# Patient Record
Sex: Male | Born: 1950 | Race: White | Hispanic: No | Marital: Married | Smoking: Former smoker
Health system: Southern US, Community
[De-identification: ages and names within clinical notes are randomized; demographics above are authoritative.]

## PROBLEM LIST (undated history)

## (undated) DIAGNOSIS — E785 Hyperlipidemia, unspecified: Secondary | ICD-10-CM

## (undated) DIAGNOSIS — M1990 Unspecified osteoarthritis, unspecified site: Secondary | ICD-10-CM

## (undated) DIAGNOSIS — I209 Angina pectoris, unspecified: Secondary | ICD-10-CM

## (undated) DIAGNOSIS — I10 Essential (primary) hypertension: Secondary | ICD-10-CM

## (undated) DIAGNOSIS — N521 Erectile dysfunction due to diseases classified elsewhere: Secondary | ICD-10-CM

## (undated) DIAGNOSIS — M109 Gout, unspecified: Secondary | ICD-10-CM

## (undated) DIAGNOSIS — IMO0001 Reserved for inherently not codable concepts without codable children: Secondary | ICD-10-CM

## (undated) DIAGNOSIS — J449 Chronic obstructive pulmonary disease, unspecified: Secondary | ICD-10-CM

## (undated) HISTORY — PX: OTHER SURGICAL HISTORY: SHX169

## (undated) HISTORY — PX: TONSILLECTOMY: SUR1361

## (undated) HISTORY — PX: CARDIAC CATHETERIZATION: SHX172

---

## 2012-02-29 ENCOUNTER — Ambulatory Visit: Payer: Self-pay | Admitting: Family Medicine

## 2014-04-09 ENCOUNTER — Observation Stay: Payer: Self-pay | Admitting: Internal Medicine

## 2014-04-09 LAB — COMPREHENSIVE METABOLIC PANEL
Albumin: 3.6 g/dL (ref 3.4–5.0)
Alkaline Phosphatase: 91 U/L
Anion Gap: 9 (ref 7–16)
BUN: 14 mg/dL (ref 7–18)
Bilirubin,Total: 0.6 mg/dL (ref 0.2–1.0)
Calcium, Total: 9 mg/dL (ref 8.5–10.1)
Chloride: 109 mmol/L — ABNORMAL HIGH (ref 98–107)
Co2: 23 mmol/L (ref 21–32)
Creatinine: 1.05 mg/dL (ref 0.60–1.30)
EGFR (African American): >60
EGFR (Non-African Amer.): >60
Glucose: 124 mg/dL — ABNORMAL HIGH (ref 65–99)
Osmolality: 283 (ref 275–301)
Potassium: 3.4 mmol/L — ABNORMAL LOW (ref 3.5–5.1)
SGOT(AST): 36 U/L (ref 15–37)
SGPT (ALT): 22 U/L (ref 12–78)
Sodium: 141 mmol/L (ref 136–145)
Total Protein: 7.7 g/dL (ref 6.4–8.2)

## 2014-04-09 LAB — CBC
HCT: 45.8 % (ref 40.0–52.0)
HGB: 16.1 g/dL (ref 13.0–18.0)
MCH: 32.6 pg (ref 26.0–34.0)
MCHC: 35.2 g/dL (ref 32.0–36.0)
MCV: 93 fL (ref 80–100)
Platelet: 180 10*3/uL (ref 150–440)
RBC: 4.96 10*6/uL (ref 4.40–5.90)
RDW: 13.1 % (ref 11.5–14.5)
WBC: 8.6 10*3/uL (ref 3.8–10.6)

## 2014-04-09 LAB — MAGNESIUM: Magnesium: 2 mg/dL

## 2014-04-09 LAB — TROPONIN I
Troponin-I: <0.02 ng/mL
Troponin-I: <0.02 ng/mL

## 2014-04-09 LAB — PRO B NATRIURETIC PEPTIDE: B-Type Natriuretic Peptide: 33 pg/mL (ref 0–125)

## 2014-04-09 LAB — CK-MB: CK-MB: <0.5 ng/mL — ABNORMAL LOW (ref 0.5–3.6)

## 2014-04-10 LAB — COMPREHENSIVE METABOLIC PANEL
Albumin: 3.5 g/dL (ref 3.4–5.0)
Alkaline Phosphatase: 85 U/L
Anion Gap: 11 (ref 7–16)
BUN: 16 mg/dL (ref 7–18)
Bilirubin,Total: 0.6 mg/dL (ref 0.2–1.0)
Calcium, Total: 9.6 mg/dL (ref 8.5–10.1)
Chloride: 108 mmol/L — ABNORMAL HIGH (ref 98–107)
Co2: 21 mmol/L (ref 21–32)
Creatinine: 1.2 mg/dL (ref 0.60–1.30)
EGFR (African American): >60
EGFR (Non-African Amer.): >60
Glucose: 158 mg/dL — ABNORMAL HIGH (ref 65–99)
Osmolality: 284 (ref 275–301)
Potassium: 4.2 mmol/L (ref 3.5–5.1)
SGOT(AST): 32 U/L (ref 15–37)
SGPT (ALT): 19 U/L (ref 12–78)
Sodium: 140 mmol/L (ref 136–145)
Total Protein: 7.8 g/dL (ref 6.4–8.2)

## 2014-04-10 LAB — CBC WITH DIFFERENTIAL/PLATELET
Basophil #: 0 10*3/uL (ref 0.0–0.1)
Basophil %: 0.1 %
Eosinophil #: 0 10*3/uL (ref 0.0–0.7)
Eosinophil %: 0.1 %
HCT: 44.2 % (ref 40.0–52.0)
HGB: 15.6 g/dL (ref 13.0–18.0)
Lymphocyte #: 1.2 10*3/uL (ref 1.0–3.6)
Lymphocyte %: 20.7 %
MCH: 32.7 pg (ref 26.0–34.0)
MCHC: 35.3 g/dL (ref 32.0–36.0)
MCV: 92 fL (ref 80–100)
Monocyte #: 0 x10 3/mm — ABNORMAL LOW (ref 0.2–1.0)
Monocyte %: 0.6 %
Neutrophil #: 4.4 10*3/uL (ref 1.4–6.5)
Neutrophil %: 78.5 %
Platelet: 170 10*3/uL (ref 150–440)
RBC: 4.78 10*6/uL (ref 4.40–5.90)
RDW: 13.2 % (ref 11.5–14.5)
WBC: 5.6 10*3/uL (ref 3.8–10.6)

## 2014-04-10 LAB — TROPONIN I
Troponin-I: <0.02 ng/mL
Troponin-I: <0.02 ng/mL

## 2014-04-10 LAB — CK-MB
CK-MB: <0.5 ng/mL — ABNORMAL LOW (ref 0.5–3.6)
CK-MB: 0.6 ng/mL (ref 0.5–3.6)
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
## 2015-01-30 NOTE — Discharge Summary (Signed)
PATIENT NAME:  William Murray, Petar N MR#:  [id number] DATE OF BIRTH:  12/04/1950  DATE OF ADMISSION:  04/09/2014 DATE OF DISCHARGE:  04/10/2014  DISCHARGE DIAGNOSES: 1. Chest pain, likely noncardiac, with negative cardiac enzymes. Could be due to acute bronchitis. History of bronchitis.  2. Chronic obstructive pulmonary disease exacerbation on steroid taper along with Spiriva, Duo-Neb and Symbicort.  3. Muscle cramps due to hypokalemia, improving. 4. Hypokalemia, repleted and resolved.   SECONDARY DIAGNOSES: 1. Hypertension.  2. Hyperlipidemia.   CONSULTATIONS: None.   PROCEDURES AND RADIOLOGY:  1. Chest x-ray on July 2nd showed no acute cardiopulmonary disease.  2. CT scan of the head without contrast in the ED showed no acute intracranial pathology.  3. CT angiogram of the chest on July 2nd showed no PE. No acute cardiopulmonary disease.   HISTORY AND SHORT HOSPITAL COURSE: The patient is a [age] year old male with above-mentioned medical problems who was admitted for chest pain. Please see Dr. Belva Chimesima Vaickute's dictated history and physical for further details. The patient was ruled out with 4 negative sets of troponins. His chest pain was significantly better, although he did have some cough which was thought to be contributing to use pain, likely muscular. After discussion with the patient, family and cardiology, the patient was discharged home in stable condition.   PERTINENT PHYSICAL EXAMINATION: On the date of discharge:  VITAL SIGNS: Temperature 98.6, heart rate 70 per minute, respirations 18 per minute, blood pressure 131/69 mmHg. He was saturating 94% on room air.  CARDIOVASCULAR: S1, S2 normal. No murmurs or rubs.  LUNGS: Clear to auscultation bilaterally. No wheezes, rales, rhonchi, or crepitation.  ABDOMEN: Soft, benign.  NEUROLOGIC: Nonfocal examination.  All other physical examination remained at baseline.   DISCHARGE MEDICATIONS:  1. Atorvastatin 10 mg p.o. at bedtime.   2. HCTZ/losartan 12.5/50 mg 1 tablet p.o. daily.  3. DuoNeb inhaled 4 times a day as needed.  4. Budesonide/formoterol 1 puff inhaled at bedtime. 5. Spiriva once daily.  6. Azithromycin 250 mg p.o. daily for 4 more days.  7. Prednisone 60 mg p.o. daily, taper 10 mg daily until finished.   DISCHARGE DIET: Low sodium.   DISCHARGE ACTIVITY: As tolerated.   DISCHARGE INSTRUCTIONS AND FOLLOWUP: The patient was instructed to follow up with his primary care physician at [facility] in 2 to 4 weeks.  [facility] early next week by Dr. Lorine BearsMuhammad Arida.    TOTAL TIME DISCHARGING THIS PATIENT: 45 minutes.      ____________________________ Ellamae SiaVipul S. Sherryll BurgerShah, MD vss:lm D: 04/10/2014 15:33:05 ET T: 04/11/2014 01:45:47 ET JOB#: [id number]  cc: Sarahanne Novakowski S. Sherryll BurgerShah, MD, <Dictator> Muhammad A. Kirke CorinArida, MD PCP at [facility] Matt Delpizzo S [facility] MD ELECTRONICALLY SIGNED 04/11/2014 16:39

## 2015-04-28 ENCOUNTER — Observation Stay
Admission: EM | Admit: 2015-04-28 | Discharge: 2015-04-29 | Disposition: A | Discharge status: Home / Self Care | Payer: Medicare Other | Attending: Specialist | Admitting: Specialist

## 2015-04-28 ENCOUNTER — Observation Stay
Admit: 2015-04-28 | Discharge: 2015-04-28 | Disposition: A | Discharge status: Home / Self Care | Payer: Medicare Other | Attending: Internal Medicine | Admitting: Internal Medicine

## 2015-04-28 ENCOUNTER — Emergency Department: Payer: Medicare Other

## 2015-04-28 DIAGNOSIS — I499 Cardiac arrhythmia, unspecified: Secondary | ICD-10-CM

## 2015-04-28 DIAGNOSIS — E782 Mixed hyperlipidemia: Secondary | ICD-10-CM | POA: Insufficient documentation

## 2015-04-28 DIAGNOSIS — J449 Chronic obstructive pulmonary disease, unspecified: Secondary | ICD-10-CM | POA: Insufficient documentation

## 2015-04-28 DIAGNOSIS — R001 Bradycardia, unspecified: Secondary | ICD-10-CM | POA: Insufficient documentation

## 2015-04-28 DIAGNOSIS — Z79899 Other long term (current) drug therapy: Secondary | ICD-10-CM | POA: Diagnosis not present

## 2015-04-28 DIAGNOSIS — M109 Gout, unspecified: Secondary | ICD-10-CM | POA: Insufficient documentation

## 2015-04-28 DIAGNOSIS — R072 Precordial pain: Principal | ICD-10-CM | POA: Insufficient documentation

## 2015-04-28 DIAGNOSIS — I471 Supraventricular tachycardia: Secondary | ICD-10-CM | POA: Insufficient documentation

## 2015-04-28 DIAGNOSIS — F17200 Nicotine dependence, unspecified, uncomplicated: Secondary | ICD-10-CM | POA: Diagnosis not present

## 2015-04-28 DIAGNOSIS — I200 Unstable angina: Secondary | ICD-10-CM | POA: Insufficient documentation

## 2015-04-28 DIAGNOSIS — R0602 Shortness of breath: Secondary | ICD-10-CM | POA: Insufficient documentation

## 2015-04-28 DIAGNOSIS — I10 Essential (primary) hypertension: Secondary | ICD-10-CM | POA: Diagnosis not present

## 2015-04-28 DIAGNOSIS — R079 Chest pain, unspecified: Secondary | ICD-10-CM

## 2015-04-28 DIAGNOSIS — E876 Hypokalemia: Secondary | ICD-10-CM | POA: Insufficient documentation

## 2015-04-28 DIAGNOSIS — Z9889 Other specified postprocedural states: Secondary | ICD-10-CM | POA: Insufficient documentation

## 2015-04-28 HISTORY — DX: Hyperlipidemia, unspecified: E78.5

## 2015-04-28 HISTORY — DX: Chronic obstructive pulmonary disease, unspecified: J44.9

## 2015-04-28 HISTORY — DX: Reserved for inherently not codable concepts without codable children: IMO0001

## 2015-04-28 HISTORY — DX: Gout, unspecified: M10.9

## 2015-04-28 HISTORY — DX: Essential (primary) hypertension: I10

## 2015-04-28 HISTORY — DX: Unspecified osteoarthritis, unspecified site: M19.90

## 2015-04-28 HISTORY — DX: Erectile dysfunction due to diseases classified elsewhere: N52.1

## 2015-04-28 HISTORY — DX: Angina pectoris, unspecified: I20.9

## 2015-04-28 LAB — COMPREHENSIVE METABOLIC PANEL
ALT: 13 U/L — ABNORMAL LOW (ref 17–63)
AST: 29 U/L (ref 15–41)
Albumin: 4.2 g/dL (ref 3.5–5.0)
Alkaline Phosphatase: 90 U/L (ref 38–126)
Anion gap: 9 (ref 5–15)
BUN: 17 mg/dL (ref 6–20)
CO2: 23 mmol/L (ref 22–32)
Calcium: 9.2 mg/dL (ref 8.9–10.3)
Chloride: 106 mmol/L (ref 101–111)
Creatinine, Ser: 0.97 mg/dL (ref 0.61–1.24)
GFR calc Af Amer: >60 mL/min (ref >60)
GFR calc non Af Amer: >60 mL/min (ref >60)
Glucose, Bld: 175 mg/dL — ABNORMAL HIGH (ref 65–99)
Potassium: 3.1 mmol/L — ABNORMAL LOW (ref 3.5–5.1)
Sodium: 138 mmol/L (ref 135–145)
Total Bilirubin: 1.2 mg/dL (ref 0.3–1.2)
Total Protein: 7.3 g/dL (ref 6.5–8.1)

## 2015-04-28 LAB — MAGNESIUM: Magnesium: 1.9 mg/dL (ref 1.7–2.4)

## 2015-04-28 LAB — CBC WITH DIFFERENTIAL/PLATELET
Basophils Absolute: 0 10*3/uL (ref 0–0.1)
Basophils Relative: 0 %
Eosinophils Absolute: 0.1 10*3/uL (ref 0–0.7)
Eosinophils Relative: 1 %
HCT: 43.2 % (ref 40.0–52.0)
Hemoglobin: 15.3 g/dL (ref 13.0–18.0)
Lymphocytes Relative: 20 %
Lymphs Abs: 1.6 10*3/uL (ref 1.0–3.6)
MCH: 32.4 pg (ref 26.0–34.0)
MCHC: 35.5 g/dL (ref 32.0–36.0)
MCV: 91.1 fL (ref 80.0–100.0)
Monocytes Absolute: 0.5 10*3/uL (ref 0.2–1.0)
Monocytes Relative: 7 %
Neutro Abs: 6 10*3/uL (ref 1.4–6.5)
Neutrophils Relative %: 72 %
Platelets: 155 10*3/uL (ref 150–440)
RBC: 4.74 MIL/uL (ref 4.40–5.90)
RDW: 12.9 % (ref 11.5–14.5)
WBC: 8.3 10*3/uL (ref 3.8–10.6)

## 2015-04-28 LAB — FIBRIN DERIVATIVES D-DIMER (ARMC ONLY): Fibrin derivatives D-dimer (ARMC): 477.37 (ref 0–499)

## 2015-04-28 LAB — TROPONIN I
Troponin I: <0.03 ng/mL (ref <0.031)
Troponin I: <0.03 ng/mL (ref <0.031)

## 2015-04-28 MED ORDER — MORPHINE SULFATE 2 MG/ML IJ SOLN
1.0000 mg | Freq: Once | INTRAMUSCULAR | Status: AC
  Administered 2015-04-28: 1 mg via INTRAVENOUS
  Filled 2015-04-28: qty 1

## 2015-04-28 MED ORDER — LOSARTAN POTASSIUM 50 MG PO TABS: 50.0000 mg | ORAL_TABLET | Freq: Every day | ORAL | Status: DC

## 2015-04-28 MED ORDER — NITROGLYCERIN 0.4 MG SL SUBL: 0.4000 mg | SUBLINGUAL_TABLET | SUBLINGUAL | Status: DC | PRN

## 2015-04-28 MED ORDER — ONDANSETRON HCL 4 MG/2ML IJ SOLN: 4.0000 mg | Freq: Four times a day (QID) | INTRAMUSCULAR | Status: DC | PRN

## 2015-04-28 MED ORDER — ALLOPURINOL 100 MG PO TABS: 100.0000 mg | ORAL_TABLET | Freq: Every day | ORAL | Status: DC

## 2015-04-28 MED ORDER — HYDROCHLOROTHIAZIDE 12.5 MG PO CAPS: 12.5000 mg | ORAL_CAPSULE | Freq: Every day | ORAL | Status: DC

## 2015-04-28 MED ORDER — LOSARTAN POTASSIUM-HCTZ 50-12.5 MG PO TABS: 1.0000 | ORAL_TABLET | Freq: Every day | ORAL | Status: DC

## 2015-04-28 MED ORDER — SODIUM CHLORIDE 0.9 % IJ SOLN
3.0000 mL | Freq: Two times a day (BID) | INTRAMUSCULAR | Status: DC
  Administered 2015-04-28 (×2): 3 mL via INTRAVENOUS

## 2015-04-28 MED ORDER — ONDANSETRON HCL 4 MG PO TABS: 4.0000 mg | ORAL_TABLET | Freq: Four times a day (QID) | ORAL | Status: DC | PRN

## 2015-04-28 MED ORDER — ASPIRIN 81 MG PO CHEW
81.0000 mg | CHEWABLE_TABLET | Freq: Every day | ORAL | Status: DC
  Administered 2015-04-28: 81 mg via ORAL
  Filled 2015-04-28: qty 1

## 2015-04-28 MED ORDER — ATORVASTATIN CALCIUM 10 MG PO TABS: 10.0000 mg | ORAL_TABLET | Freq: Every evening | ORAL | Status: DC

## 2015-04-28 MED ORDER — POTASSIUM CHLORIDE CRYS ER 20 MEQ PO TBCR
40.0000 meq | EXTENDED_RELEASE_TABLET | Freq: Once | ORAL | Status: AC
Start: 2015-04-28 — End: 2015-04-28
  Administered 2015-04-28: 40 meq via ORAL
  Filled 2015-04-28: qty 2

## 2015-04-28 MED ORDER — ENOXAPARIN SODIUM 40 MG/0.4ML ~~LOC~~ SOLN
40.0000 mg | SUBCUTANEOUS | Status: DC
  Administered 2015-04-28: 40 mg via SUBCUTANEOUS
  Filled 2015-04-28: qty 0.4

## 2015-04-28 MED ORDER — ALUM & MAG HYDROXIDE-SIMETH 200-200-20 MG/5ML PO SUSP: 30.0000 mL | Freq: Four times a day (QID) | ORAL | Status: DC | PRN

## 2015-04-28 NOTE — H&P (Addendum)
[facility] [place] [facility]   PATIENT NAME: William Murray    MR#:  [id number]  DATE OF BIRTH:  [birth year]  DATE OF ADMISSION:  04/28/2015  PRIMARY CARE PHYSICIAN: No PCP Per Patient   REQUESTING/REFERRING PHYSICIAN:Dr.Gayle  CHIEF COMPLAINT:   Chief Complaint  Patient presents with  . Chest Pain    HISTORY OF PRESENT ILLNESS:  William Murray  is a 64 y.o. male with a known history of COPD, hyperlipidemia, hypertension came in because of for chest pain. Patient chest pain started today morning in the middle of the chest and it is sharp and 4 out of 10 and  radiated to the jaw. Patient to the received the 2 aspirin 81 mg each by EMS and then chest pressure resolved. Patient 2  Sets of troponins were negative and he was ready to be discharged but he developed 9 beat run of nonsustained V. tach and he was symptomatic that time. Concerning his chest pain and nonsustained V. tach patient will be admitted to observation status. Patient right now is chest pain-free and the patient denies any radiation of pain and no aggravating factors and no patient just continued with aspirin given by EMS. Denies cough, cough, nausea, shortness of breath, diaphoresis.  PAST MEDICAL HISTORY:   Past Medical History  Diagnosis Date  . Hypertension   . COPD (chronic obstructive pulmonary disease)   . Gout   . Hyperlipidemia   . Erectile disorder due to medical condition in male     PAST SURGICAL HISTOIRY:   Past Surgical History  Procedure Laterality Date  . Tonsillectomy      SOCIAL HISTORY:   History  Substance Use Topics  . Smoking status: Current Every Day Smoker  . Smokeless tobacco: Never Used  . Alcohol Use: Yes    FAMILY HISTORY:  No family history on file.  DRUG ALLERGIES:  No Known Allergies  REVIEW OF SYSTEMS:  CONSTITUTIONAL: No fever, fatigue or weakness.  EYES: No blurred or double vision.  EARS, NOSE, AND THROAT: No tinnitus or ear pain.   RESPIRATORY: No cough, shortness of breath, wheezing or hemoptysis.  CARDIOVASCULAR: No chest pain, orthopnea, edema.  GASTROINTESTINAL: No nausea, vomiting, diarrhea or abdominal pain.  GENITOURINARY: No dysuria, hematuria.  ENDOCRINE: No polyuria, nocturia,  HEMATOLOGY: No anemia, easy bruising or bleeding SKIN: No rash or lesion. MUSCULOSKELETAL: No joint pain or arthritis.   NEUROLOGIC: No tingling, numbness, weakness.  PSYCHIATRY: No anxiety or depression.   MEDICATIONS AT HOME:   Prior to Admission medications   Medication Sig Start Date End Date Taking? Authorizing Provider  allopurinol (ZYLOPRIM) 100 MG tablet Take 1 tablet by mouth daily.   Yes Historical Provider, MD  atorvastatin (LIPITOR) 10 MG tablet Take 1 tablet by mouth every evening.   Yes Historical Provider, MD  losartan-hydrochlorothiazide (HYZAAR) 50-12.5 MG per tablet Take 1 tablet by mouth daily.   Yes Historical Provider, MD      VITAL SIGNS:  Blood pressure 133/75, pulse 52, temperature 98.1 F (36.7 C), temperature source Oral, resp. rate 18, height  (1.753 m), weight 89.812 kg (198 lb), SpO2 96 %.  PHYSICAL EXAMINATION:  GENERAL:  64 y.o.-year-old patient lying in the bed with no acute distress.  EYES: Pupils equal, round, reactive to light and accommodation. No scleral icterus. Extraocular muscles intact.  HEENT: Head atraumatic, normocephalic. Oropharynx and nasopharynx clear.  NECK:  Supple, no jugular venous distention. No thyroid enlargement, no tenderness.  LUNGS: Normal breath  sounds bilaterally, no wheezing, rales,rhonchi or crepitation. No use of accessory muscles of respiration.  CARDIOVASCULAR: S1, S2 normal. No murmurs, rubs, or gallops.  ABDOMEN: Soft, nontender, nondistended. Bowel sounds present. No organomegaly or mass.  EXTREMITIES: No pedal edema, cyanosis, or clubbing.  NEUROLOGIC: Cranial nerves II through XII are intact. Muscle strength 5/5 in all extremities. Sensation intact.  Gait not checked.  PSYCHIATRIC: The patient is alert and oriented x 3.  SKIN: No obvious rash, lesion, or ulcer.   LABORATORY PANEL:   CBC  Recent Labs Lab 04/28/15 1031  WBC 8.3  HGB 15.3  HCT 43.2  PLT 155   ------------------------------------------------------------------------------------------------------------------  Chemistries   Recent Labs Lab 04/28/15 1031  NA 138  K 3.1*  CL 106  CO2 23  GLUCOSE 175*  BUN 17  CREATININE 0.97  CALCIUM 9.2  AST 29  ALT 13*  ALKPHOS 90  BILITOT 1.2   ------------------------------------------------------------------------------------------------------------------  Cardiac Enzymes  Recent Labs Lab 04/28/15 1312  TROPONINI <0.03   ------------------------------------------------------------------------------------------------------------------  RADIOLOGY:  Dg Chest 2 View  04/28/2015   CLINICAL DATA:  Acute onset midsternal chest pain radiating into mandible region.  EXAM: CHEST  2 VIEW  COMPARISON:  Chest radiograph April 09, 2014 and chest CT April 09, 2014  FINDINGS: There is slight scarring in the left base. There is no edema or consolidation. The heart size and pulmonary vascularity are normal. No adenopathy. There is mild degenerative change in the lower thoracic spine.  IMPRESSION: Mild scarring left base.  No edema or consolidation.   Electronically Signed   By: Bretta BangWilliam  Woodruff III M.D.   On: 04/28/2015 11:06    EKG:   Orders placed or performed in visit on 04/09/14  . EKG 12-Lead   NSR  ,71 BPM; no ST-T changes IMPRESSION AND PLAN:   [age] yr old male atient with chest pain resolved on its own ,two sets  Of  troponins are negative . sounds atypical but because of his advanced age and risk factors of hypertension and nonsustained V. tach we are going to admit him to observation status, if V. tach occurs again patient needs cardiology consult . we ordered echocardiogram and stress test for tomorrow morning. NSVt  likely due to  Hypokalemia, patient potassium is replaced. History of hypertension controlled continue home medications unable to use beta blocker secondary to bradycardia Hyperlipidemia continue statins , check fasting  Lipids. History of gout continue allopurinol.    All the records are reviewed and case discussed with ED provider. Management plans discussed with the patient, family and they are in agreement.  CODE STATUS :full  TOTAL TIME TAKING CARE OF THIS PATIENT:55 minutes.    Katha HammingKONIDENA,Rhonna Holster M.D on 04/28/2015 at 3:32 PM  Between 7am to 6pm - Pager - [phone]  After 6pm go to [email or web address] - password [email or web address]  [facility]  Office  [phone]  CC: Primary care physician; No PCP Per Patient

## 2015-04-28 NOTE — ED Notes (Signed)
Pt transported to CXR 

## 2015-04-28 NOTE — Consult Note (Signed)
[facility] Consultation Note  Patient ID: William GoldsJohnnie N Schwarz Sr., MRN: [id number], DOB/AGE: 66/04/1951 66 y.o. Admit date: 04/28/2015   Date of Consult: 04/28/2015 Primary Physician: No PCP Per Patient Primary Cardiologist: None  Chief Complaint:  Chief Complaint  Patient presents with  . Chest Pain   Reason for Consult: chest pain with supraventricular tachycardia and hypertension hyperlipidemia  HPI: 64 y.o. male with known essential hypertension and mixed hyperlipidemia on appropriate medication management and stable with new onset substernal chest discomfort radiating into his back occurring throughout the day and waxing and waning in its intensity with no significant relief until spontaneously when he was in the emergency room. The patient has had a normal troponin and EKG showing normal sinus rhythm without evidence of myocardial infarction. The patient has had a narrow complex tachycardia of the seventh 8 beats during a period of time he had chest pain concerning for cardiovascular disease. He now has significant improvements of symptoms and is more stable  Past Medical History  Diagnosis Date  . Hypertension   . COPD (chronic obstructive pulmonary disease)   . Gout   . Hyperlipidemia   . Erectile disorder due to medical condition in male   . Anginal pain   . Shortness of breath dyspnea   . Arthritis       Surgical History:  Past Surgical History  Procedure Laterality Date  . Tonsillectomy    . Cardiac catheterization       Home Meds: Prior to Admission medications   Medication Sig Start Date End Date Taking? Authorizing Provider  allopurinol (ZYLOPRIM) 100 MG tablet Take 1 tablet by mouth daily.   Yes Historical Provider, MD  atorvastatin (LIPITOR) 10 MG tablet Take 1 tablet by mouth every evening.   Yes Historical Provider, MD  losartan-hydrochlorothiazide (HYZAAR) 50-12.5 MG per tablet Take 1 tablet by mouth daily.   Yes Historical Provider, MD     Inpatient Medications:  . allopurinol  100 mg Oral Daily  . aspirin  81 mg Oral Daily  . atorvastatin  10 mg Oral QPM  . enoxaparin (LOVENOX) injection  40 mg Subcutaneous Q24H  . losartan  50 mg Oral Daily   And  . hydrochlorothiazide  12.5 mg Oral Daily  .  morphine injection  1 mg Intravenous Once  . sodium chloride  3 mL Intravenous Q12H      Allergies: No Known Allergies  History   Social History  . Marital Status: Married    Spouse Name: N/A  . Number of Children: N/A  . Years of Education: N/A   Occupational History  . Not on file.   Social History Main Topics  . Smoking status: Former Smoker -- 0.50 packs/day for 50 years    Types: Cigarettes    Quit date: 10/09/2014  . Smokeless tobacco: Never Used  . Alcohol Use: 1.8 oz/week    3 Cans of beer per week  . Drug Use: No  . Sexual Activity: Yes   Other Topics Concern  . Not on file   Social History Narrative  . No narrative on file     Family History  Problem Relation Age of Onset  . Renal Disease Mother   . Dementia Father   . COPD Father   . Cervical cancer Sister   . Aneurysm Sister   . Bladder Cancer Brother      Review of Systems Positive for chest pain shortness of breath Negative for: General:  chills, fever, night  sweats or weight changes.  Cardiovascular: PND orthopnea syncope dizziness  Dermatological skin lesions rashes Respiratory: Cough congestion Urologic: Frequent urination urination at night and hematuria Abdominal: negative for nausea, vomiting, diarrhea, bright red blood per rectum, melena, or hematemesis Neurologic: negative for visual changes, and/or hearing changes  All other systems reviewed and are otherwise negative except as noted above.  Labs:  Recent Labs  04/28/15 1031 04/28/15 1312  TROPONINI <0.03 <0.03   Lab Results  Component Value Date   WBC 8.3 04/28/2015   HGB 15.3 04/28/2015   HCT 43.2 04/28/2015   MCV 91.1 04/28/2015   PLT 155 04/28/2015     Recent Labs Lab 04/28/15 1031  NA 138  K 3.1*  CL 106  CO2 23  BUN 17  CREATININE 0.97  CALCIUM 9.2  PROT 7.3  BILITOT 1.2  ALKPHOS 90  ALT 13*  AST 29  GLUCOSE 175*   No results found for: CHOL, HDL, LDLCALC, TRIG No results found for: DDIMER  Radiology/Studies:  Dg Chest 2 View  04/28/2015   CLINICAL DATA:  Acute onset midsternal chest pain radiating into mandible region.  EXAM: CHEST  2 VIEW  COMPARISON:  Chest radiograph April 09, 2014 and chest CT April 09, 2014  FINDINGS: There is slight scarring in the left base. There is no edema or consolidation. The heart size and pulmonary vascularity are normal. No adenopathy. There is mild degenerative change in the lower thoracic spine.  IMPRESSION: Mild scarring left base.  No edema or consolidation.   Electronically Signed   By: Bretta Bang III M.D.   On: 04/28/2015 11:06    EKG: Normal sinus rhythm. Normal EKG  Weights: Filed Weights   04/28/15 1023  Weight: 198 lb (89.812 kg)     Physical Exam: Blood pressure 142/60, pulse 52, temperature 97.9 F (36.6 C), temperature source Oral, resp. rate 17, height  (1.753 m), weight 198 lb (89.812 kg), SpO2 97 %. Body mass index is 29.23 kg/(m^2). General: Well developed, well nourished, in no acute distress. Head eyes ears nose throat: Normocephalic, atraumatic, sclera non-icteric, no xanthomas, nares are without discharge. No apparent thyromegaly and/or mass  Lungs: Normal respiratory effort.  no wheezes, no rales, no rhonchi.  Heart: RRR with normal S1 S2. no murmur gallop, no rub, PMI is normal size and placement, carotid upstroke normal without bruit, jugular venous pressure is normal Abdomen: Soft, non-tender, non-distended with normoactive bowel sounds. No hepatomegaly. No rebound/guarding. No obvious abdominal masses. Abdominal aorta is normal size without bruit Extremities: No edema. no cyanosis, no clubbing, no ulcers  Peripheral : 2+ bilateral upper extremity  pulses, 2+ bilateral femoral pulses, 2+ bilateral dorsal pedal pulse Neuro: Alert and oriented. No facial asymmetry. No focal deficit. Moves all extremities spontaneously. Musculoskeletal: Normal muscle tone without kyphosis Psych:  Responds to questions appropriately with a normal affect.    Assessment: 64 year old male with essential hypertension mixed hyperlipidemia with unstable angina and no current evidence of myocardial infarction and supraventricular tachycardia  Plan: 1. Continue serial ECG and enzymes to assess for possible myocardial infarction 2. Echocardiogram for LV systolic dysfunction and valvular heart disease 3. Continue angiotensin receptor blocker for hypertension control 4. Atorvastatin for high intensity cholesterol therapy 5. Consider treadmill stress test for further evaluation of supraventricular tachycardia and or anginal symptoms  Signed, Lamar Blinks M.D. [facility] 04/28/2015, 5:36 PM

## 2015-04-28 NOTE — ED Notes (Signed)
Report given to Kelly RN 

## 2015-04-28 NOTE — Progress Notes (Signed)
Pt skin assessed by 2 RNs, myself and Eden LatheLexi Miller. Skin intact no wounds. Pt looks like he has ant/spider bites on his feet. He stated he put anti-itch lotion on them. Otherwise skin looks good.

## 2015-04-28 NOTE — ED Notes (Signed)
Pt c/o sudden onset central chest pain that started around 830am when he was going out to start doing some yard work at home with c/o SOB..Marland Kitchen

## 2015-04-28 NOTE — ED Notes (Signed)
Returned from CXR

## 2015-04-28 NOTE — ED Notes (Signed)
Assisted patient to bedside commode

## 2015-04-28 NOTE — Plan of Care (Signed)
Problem: Phase I Progression Outcomes Goal: Anginal pain relieved Outcome: Progressing Pt transferred from the ED this evening c/o chest pain. Troponin neg x 2. Pt SB/NSR 50-60s. In report with ED RN it was noted that pt had a short SVT run. SBP 120-140/60-70s. Pt on RA SpO2 >90%. Pt still c/o chest pain, pt unable to receive nitroglycerin because pt took Viagra pill last night. Pt given IV Morphine for pain management. Pt currently resting in room with family.

## 2015-04-28 NOTE — ED Provider Notes (Addendum)
[facility] Emergency Department Provider Note  ____________________________________________  Time seen: Approximately 10:50 AM  I have reviewed the triage vital signs and the nursing notes.   HISTORY  Chief Complaint Chest Pain    HPI William LionsJohnnie N Chumney Sr. is a 64 y.o. male with history of COPD, hyperlipidemia, hypertension who presents for evaluation of now resolved sharp chest pain, sudden onset. Patient reports that he suddenly developed central chest pain at 8:30 this morning at rest. He reports that it radiated into his jaw and all throughout his head. It was not associated with any sudden diaphoresis or nausea. He has chronic shortness of breath but reports that his short of breath was not worse while he was having chest pain. He reports that his symptoms did not seem to be worse when he was doing yard work in his garden. He reports his symptoms lasted for approximately 2 hours and at this time they have resolved. He denies any recent illness. No modifying factors. He reports that he called EMS, they administered aspirin, no nitroglycerin as he takes Viagra.   Past Medical History  Diagnosis Date  . Hypertension   . COPD (chronic obstructive pulmonary disease)   . Gout   . Hyperlipidemia   . Erectile disorder due to medical condition in male     There are no active problems to display for this patient.   Past Surgical History  Procedure Laterality Date  . Tonsillectomy      Current Outpatient Rx  Name  Route  Sig  Dispense  Refill  . allopurinol (ZYLOPRIM) 100 MG tablet   Oral   Take 1 tablet by mouth daily.         Marland Kitchen. atorvastatin (LIPITOR) 10 MG tablet   Oral   Take 1 tablet by mouth every evening.         Marland Kitchen. losartan-hydrochlorothiazide (HYZAAR) 50-12.5 MG per tablet   Oral   Take 1 tablet by mouth daily.           Allergies Review of patient's allergies indicates no known allergies.  No family history on file.  Social  History History  Substance Use Topics  . Smoking status: Current Every Day Smoker  . Smokeless tobacco: Never Used  . Alcohol Use: Yes    Review of Systems Constitutional: No fever/chills Eyes: No visual changes. ENT: No sore throat. Cardiovascular: + chest pain. Respiratory: + shortness of breath. Gastrointestinal: No abdominal pain.  No nausea, no vomiting.  No diarrhea.  No constipation. Genitourinary: Negative for dysuria. Musculoskeletal: Negative for back pain. Skin: Negative for rash. Neurological: Negative for headaches, focal weakness or numbness.  10-point ROS otherwise negative.  ____________________________________________   PHYSICAL EXAM:  VITAL SIGNS: ED Triage Vitals  Enc Vitals Group     BP 04/28/15 1023 150/76 mmHg     Pulse Rate 04/28/15 1023 69     Resp 04/28/15 1023 28     Temp 04/28/15 1023 98.1 F (36.7 C)     Temp Source 04/28/15 1023 Oral     SpO2 04/28/15 1023 95 %     Weight 04/28/15 1023 198 lb (89.812 kg)     Height 04/28/15 1023 5\' 9"  (1.753 m)     Head Cir --      Peak Flow --      Pain Score 04/28/15 1024 7     Pain Loc --      Pain Edu? --      Excl. in GC? --  Constitutional: Alert and oriented. Well appearing and in no acute distress. Eyes: Conjunctivae are normal. PERRL. EOMI. Head: Atraumatic. Nose: No congestion/rhinnorhea. Mouth/Throat: Mucous membranes are moist.  Oropharynx non-erythematous. Neck: No stridor.  Cardiovascular: Normal rate, regular rhythm. Grossly normal heart sounds.  Good peripheral circulation. Respiratory: Normal respiratory effort.  No retractions. Lungs CTAB. Gastrointestinal: Soft and nontender. No distention. No abdominal bruits. No CVA tenderness. Genitourinary: deferred Musculoskeletal: No lower extremity tenderness nor edema.  No joint effusions. Neurologic:  Normal speech and language. No gross focal neurologic deficits are appreciated. No gait instability. Skin:  Skin is warm, dry and  intact. No rash noted. Psychiatric: Mood and affect are normal. Speech and behavior are normal.  ____________________________________________   LABS (all labs ordered are listed, but only abnormal results are displayed)  Labs Reviewed  COMPREHENSIVE METABOLIC PANEL - Abnormal; Notable for the following:    Potassium 3.1 (*)    Glucose, Bld 175 (*)    ALT 13 (*)    All other components within normal limits  CBC WITH DIFFERENTIAL/PLATELET  TROPONIN I  FIBRIN DERIVATIVES D-DIMER ([facility] ONLY)  TROPONIN I  MAGNESIUM   ____________________________________________  EKG  ED ECG REPORT I, Gayla Doss, the attending physician, personally viewed and interpreted this ECG.   Date: 04/28/2015  EKG Time: 10:25  Rate: 71  Rhythm: normal EKG, normal sinus rhythm  Axis: normal  Intervals:none  ST&T Change: No acute ST segment elevation.  ____________________________________________  RADIOLOGY  CXR FINDINGS: There is slight scarring in the left base. There is no edema or consolidation. The heart size and pulmonary vascularity are normal. No adenopathy. There is mild degenerative change in the lower thoracic spine.  IMPRESSION: Mild scarring left base. No edema or consolidation.   ____________________________________________   PROCEDURES  Procedure(s) performed: None  Critical Care performed: No  ____________________________________________   INITIAL IMPRESSION / ASSESSMENT AND PLAN / ED COURSE  Pertinent labs & imaging results that were available during my care of the patient were reviewed by me and considered in my medical decision making (see chart for details).  William Murray Sr. is a 64 y.o. male with history of COPD, hyperlipidemia, hypertension who presents for evaluation of now resolved sharp chest pain, sudden onset now resolved. He received aspirin from EMS. On exam, he is very well-appearing and in no acute distress. Vital signs stable, he is afebrile.  EKG is reassuring. Plan for screening cardiac labs, 2 sets of cardiac enzymes, chest x-ray. Reassess for disposition.  ----------------------------------------- 2:50 PM on 04/28/2015 ----------------------------------------- D-dimer not elevated, doubt PE. 2 sets of troponins negative. I went to reassess the patient at approximately 2:00 pm at which time he reported to me that his pain had returned. He suddenly had a 9 beat run of ventricular beats concerning for symptomatic ventricular arrhythmia. This terminated spontaneously. Discussed with hospitalist for admission.  ____________________________________________   FINAL CLINICAL IMPRESSION(S) / ED DIAGNOSES  Final diagnoses:  Chest pain, unspecified chest pain type  Ventricular arrhythmia      Gayla Doss, MD 04/28/15 1453  Gayla Doss, MD 04/28/15 1453

## 2015-04-29 ENCOUNTER — Observation Stay: Payer: Medicare Other

## 2015-04-29 ENCOUNTER — Encounter: Payer: Self-pay | Admitting: Radiology

## 2015-04-29 LAB — CBC
HCT: 42.8 % (ref 40.0–52.0)
Hemoglobin: 14.9 g/dL (ref 13.0–18.0)
MCH: 32.6 pg (ref 26.0–34.0)
MCHC: 34.9 g/dL (ref 32.0–36.0)
MCV: 93.3 fL (ref 80.0–100.0)
Platelets: 150 10*3/uL (ref 150–440)
RBC: 4.58 MIL/uL (ref 4.40–5.90)
RDW: 13.3 % (ref 11.5–14.5)
WBC: 8.3 10*3/uL (ref 3.8–10.6)

## 2015-04-29 LAB — BASIC METABOLIC PANEL
Anion gap: 7 (ref 5–15)
BUN: 18 mg/dL (ref 6–20)
CO2: 25 mmol/L (ref 22–32)
Calcium: 8.7 mg/dL — ABNORMAL LOW (ref 8.9–10.3)
Chloride: 108 mmol/L (ref 101–111)
Creatinine, Ser: 1.04 mg/dL (ref 0.61–1.24)
GFR calc Af Amer: >60 mL/min (ref >60)
GFR calc non Af Amer: >60 mL/min (ref >60)
Glucose, Bld: 110 mg/dL — ABNORMAL HIGH (ref 65–99)
Potassium: 4.3 mmol/L (ref 3.5–5.1)
Sodium: 140 mmol/L (ref 135–145)

## 2015-04-29 LAB — LIPID PANEL
Cholesterol: 125 mg/dL (ref 0–200)
HDL: 26 mg/dL — ABNORMAL LOW (ref >40)
LDL Cholesterol: 60 mg/dL (ref 0–99)
Total CHOL/HDL Ratio: 4.8 RATIO
Triglycerides: 197 mg/dL — ABNORMAL HIGH (ref <150)
VLDL: 39 mg/dL (ref 0–40)

## 2015-04-29 LAB — TROPONIN I: Troponin I: <0.03 ng/mL (ref <0.031)

## 2015-04-29 LAB — HEMOGLOBIN A1C: Hgb A1c MFr Bld: 5.2 % (ref 4.0–6.0)

## 2015-04-29 MED ORDER — TECHNETIUM TC 99M SESTAMIBI GENERIC - CARDIOLITE
30.0000 | Freq: Once | INTRAVENOUS | Status: AC | PRN
  Administered 2015-04-29: 30.87 via INTRAVENOUS

## 2015-04-29 MED ORDER — REGADENOSON 0.4 MG/5ML IV SOLN
0.4000 mg | Freq: Once | INTRAVENOUS | Status: AC
  Administered 2015-04-29: 0.4 mg via INTRAVENOUS

## 2015-04-29 MED ORDER — TECHNETIUM TC 99M SESTAMIBI GENERIC - CARDIOLITE
13.0000 | Freq: Once | INTRAVENOUS | Status: AC | PRN
  Administered 2015-04-29: 12.49 via INTRAVENOUS

## 2015-04-29 MED ORDER — ASPIRIN EC 81 MG PO TBEC: 81.0000 mg | DELAYED_RELEASE_TABLET | Freq: Every day | ORAL | Status: DC

## 2015-04-29 NOTE — Progress Notes (Signed)
[facility] Cardiology [facility] Encounter Note  Patient: William WILDES Sr. / Admit Date: 04/28/2015 / Date of Encounter: 04/29/2015, 8:21 AM   Subjective: Waxing and waning chest discomfort throughout the night atypical in nature  Review of Systems: Positive for: Chest pain Negative for: Vision change, hearing change, syncope, dizziness, nausea, vomiting,diarrhea, bloody stool, stomach pain, cough, congestion, diaphoresis, urinary frequency, urinary pain,skin lesions, skin rashes Others previously listed  Objective: Telemetry: Normal sinus rhythm Physical Exam: Blood pressure 124/60, pulse 53, temperature 98 F (36.7 C), temperature source Oral, resp. rate 20, height 5\' 9"  (1.753 m), weight 202 lb 4.8 oz (91.763 kg), SpO2 96 %. Body mass index is 29.86 kg/(m^2). General: Well developed, well nourished, in no acute distress. Head: Normocephalic, atraumatic, sclera non-icteric, no xanthomas, nares are without discharge. Neck: No apparent masses Lungs: Normal respirations with no wheezes, no rhonchi, no rales , no crackles   Heart: Regular rate and rhythm, normal S1 S2, no murmur, no rub, no gallop, PMI is normal size and placement, carotid upstroke normal without bruit, jugular venous pressure normal Abdomen: Soft, non-tender, non-distended with normoactive bowel sounds. No hepatosplenomegaly. Abdominal aorta is normal size without bruit Extremities: No edema, no clubbing, no cyanosis, no ulcers,  Peripheral: 2+ radial, 2+ femoral, 2+ dorsal pedal pulses Neuro: Alert and oriented. Moves all extremities spontaneously. Psych:  Responds to questions appropriately with a normal affect.   Intake/Output Summary (Last 24 hours) at 04/29/15 5409 Last data filed at 04/28/15 1959  Gross per 24 hour  Intake    243 ml  Output    625 ml  Net   -382 ml    Inpatient Medications:  . allopurinol  100 mg Oral Daily  . aspirin  81 mg Oral Daily  . atorvastatin  10 mg Oral QPM  . enoxaparin  (LOVENOX) injection  40 mg Subcutaneous Q24H  . losartan  50 mg Oral Daily   And  . hydrochlorothiazide  12.5 mg Oral Daily  . sodium chloride  3 mL Intravenous Q12H   Infusions:    Labs:  Recent Labs  04/28/15 1031 04/29/15 0403  NA 138 140  K 3.1* 4.3  CL 106 108  CO2 23 25  GLUCOSE 175* 110*  BUN 17 18  CREATININE 0.97 1.04  CALCIUM 9.2 8.7*  MG 1.9  --     Recent Labs  04/28/15 1031  AST 29  ALT 13*  ALKPHOS 90  BILITOT 1.2  PROT 7.3  ALBUMIN 4.2    Recent Labs  04/28/15 1031 04/29/15 0403  WBC 8.3 8.3  NEUTROABS 6.0  --   HGB 15.3 14.9  HCT 43.2 42.8  MCV 91.1 93.3  PLT 155 150    Recent Labs  04/28/15 1031 04/28/15 1312  TROPONINI <0.03 <0.03   Invalid input(s): POCBNP  Recent Labs  04/28/15 1031  HGBA1C 5.2     Weights: Filed Weights   04/28/15 1023 04/28/15 1654  Weight: 198 lb (89.812 kg) 202 lb 4.8 oz (91.763 kg)     Radiology/Studies:  Dg Chest 2 View  04/28/2015   CLINICAL DATA:  Acute onset midsternal chest pain radiating into mandible region.  EXAM: CHEST  2 VIEW  COMPARISON:  Chest radiograph April 09, 2014 and chest CT April 09, 2014  FINDINGS: There is slight scarring in the left base. There is no edema or consolidation. The heart size and pulmonary vascularity are normal. No adenopathy. There is mild degenerative change in the lower thoracic spine.  IMPRESSION:  Mild scarring left base.  No edema or consolidation.   Electronically Signed   By: Bretta Bang III M.D.   On: 04/28/2015 11:06     Assessment and Recommendation  64 y.o. male with essential hypertension makes hyperlipidemia acute chest discomfort without evidence of myocardial infarction at this time with a short run of supraventricular tachycardia able today 1. Continue aspirin for further risk reduction in cardiovascular event 2. Stress test for further evaluation of chest pain shortness of breath and other significant symptoms 3. Hypertension control with  losartan 4. Possible discharged home if stress test normal  Signed, Arnoldo Hooker M.D. FACC

## 2015-04-29 NOTE — Discharge Instructions (Signed)

## 2015-04-29 NOTE — Progress Notes (Signed)
[facility] - [place] at [facility]   PATIENT NAME: William Murray    MR#:  [id number]  DATE OF BIRTH:  10-03-1951  SUBJECTIVE:  CHIEF COMPLAINT:   Chief Complaint  Patient presents with  . Chest Pain   Pt. Here due to chest pain.  Had Myoview this a.m. And results pending.  Currently still having some intermittent chest pain.  No other complaints.    REVIEW OF SYSTEMS:    Review of Systems  Constitutional: Negative for fever and chills.  HENT: Negative for congestion and tinnitus.   Eyes: Negative for blurred vision and double vision.  Respiratory: Negative for cough, shortness of breath and wheezing.   Cardiovascular: Negative for chest pain, orthopnea and PND.  Gastrointestinal: Negative for nausea, vomiting, abdominal pain and diarrhea.  Genitourinary: Negative for dysuria and hematuria.  Neurological: Negative for dizziness, sensory change and focal weakness.  All other systems reviewed and are negative.   Nutrition: Heart Healthy Tolerating Diet: Yes Tolerating PT: Ambulatory   DRUG ALLERGIES:  No Known Allergies  VITALS:  Blood pressure 124/60, pulse 53, temperature 98 F (36.7 C), temperature source Oral, resp. rate 20, height  (1.753 m), weight 91.763 kg (202 lb 4.8 oz), SpO2 96 %.  PHYSICAL EXAMINATION:   Physical Exam  GENERAL:  64 y.o.-year-old patient lying in the bed with no acute distress.  EYES: Pupils equal, round, reactive to light and accommodation. No scleral icterus. Extraocular muscles intact.  HEENT: Head atraumatic, normocephalic. Oropharynx and nasopharynx clear. Poor Dentition.  NECK:  Supple, no jugular venous distention. No thyroid enlargement, no tenderness.  LUNGS: Normal breath sounds bilaterally, no wheezing, rales, rhonchi. No use of accessory muscles of respiration.  CARDIOVASCULAR: S1, S2 normal. No murmurs, rubs, or gallops.  ABDOMEN: Soft, nontender, nondistended. Bowel sounds present. No organomegaly  or mass.  EXTREMITIES: No cyanosis, clubbing or edema b/l.    NEUROLOGIC: Cranial nerves II through XII are intact. No focal Motor or sensory deficits b/l.   PSYCHIATRIC: The patient is alert and oriented x 3. Good affect SKIN: No obvious rash, lesion, or ulcer.    LABORATORY PANEL:   CBC  Recent Labs Lab 04/29/15 0403  WBC 8.3  HGB 14.9  HCT 42.8  PLT 150   ------------------------------------------------------------------------------------------------------------------  Chemistries   Recent Labs Lab 04/28/15 1031 04/29/15 0403  NA 138 140  K 3.1* 4.3  CL 106 108  CO2 23 25  GLUCOSE 175* 110*  BUN 17 18  CREATININE 0.97 1.04  CALCIUM 9.2 8.7*  MG 1.9  --   AST 29  --   ALT 13*  --   ALKPHOS 90  --   BILITOT 1.2  --    ------------------------------------------------------------------------------------------------------------------  Cardiac Enzymes  Recent Labs Lab 04/29/15 0403  TROPONINI <0.03   ------------------------------------------------------------------------------------------------------------------  RADIOLOGY:  Dg Chest 2 View  04/28/2015   CLINICAL DATA:  Acute onset midsternal chest pain radiating into mandible region.  EXAM: CHEST  2 VIEW  COMPARISON:  Chest radiograph April 09, 2014 and chest CT April 09, 2014  FINDINGS: There is slight scarring in the left base. There is no edema or consolidation. The heart size and pulmonary vascularity are normal. No adenopathy. There is mild degenerative change in the lower thoracic spine.  IMPRESSION: Mild scarring left base.  No edema or consolidation.   Electronically Signed   By: Bretta Bang III M.D.   On: 04/28/2015 11:06     ASSESSMENT AND PLAN:   63  yo male w/ hx of HTN, COPD, Gout, Hyperlipidemia, ongoing tobacco abuse who presented to the hospital with chest pain.   #1 Chest Pain - likely musculoskeletal in nature.  But pt. Does have risk factors for CAD given ongoing tobacco abuse & HTN.   - serial enzymes X 3 have been (-).  Had Myoview this a.m but results pending.  - currrently CP free and hemodynamically stable.  - no alarms on tele, ECG with now ST or T wave changes.  - cont. ASA, Nitro, Morphine, Statin.   #2 COPD - no acute exacerbation.  - Prn duonebs.   #3 Gout - no acute attack - cont Allopurinol.    #4 HTN - cont. Losartan, HCTZ  #5 Hyperlipidemia - cont. Atorvastatin   All the records are reviewed and case discussed with Care Management/Social Workerr. Management plans discussed with the patient, family and they are in agreement.  CODE STATUS: Full  DVT Prophylaxis: Lovenox  TOTAL TIME TAKING CARE OF THIS PATIENT: 25 minutes.   POSSIBLE D/C later today if Myoview is (-).    Houston Siren M.D on 04/29/2015 at 2:34 PM  Between 7am to 6pm - Pager - [phone]  After 6pm go to [email or web address] - password [email or web address] [facility]  [place] [facility]  Office  [phone]  CC: Primary care physician; Ashley Mariner, MD

## 2015-04-29 NOTE — Progress Notes (Signed)
Patient left AMA.  He and his wife were very agitated that they had did not have results from stress test back from this morning.  Charge nurse explained that the cardiologist was going to read the results and the patient can get discharged if the results are negative.  Patient unwilling to stay in hospital regardless of options.  Dr. Darrold Junker on floor at the time.  Patient refused to speak to him.  He refused to speak to hospitialist, Dr. Cherlynn Kaiser.  Refused to speak to Supervisor, Programmer, applications.  Also refused to sign AMA paper.    William Murray removed IV and Telemetry monitor.  Patient dressed and left the hospital with his wife.

## 2015-04-29 NOTE — Progress Notes (Signed)
Called by nurse that pt has decided to leave AMA.  He did not want to wait for his stress test results and he was quite agitated.  He did not sign the AMA papers.

## 2015-04-30 NOTE — Discharge Summary (Signed)
This serves as a discharge summary for William GAIL Sr.  04/28/2015 10:18 AM  Discharge date - 04/29/2015  -For detailed note please take a look at the progress note on 04/29/2015.  Patient was admitted for workup of chest pain and underwent a stress test. Patient did not want to wait for his stress test results and did not want to speak to the cardiologist and left AGAINST MEDICAL ADVICE on the afternoon of 04/29/2015.  Paitent refused to sign the AGAINST MEDICAL ADVICE form.

## 2015-05-18 LAB — NM MYOCAR MULTI W/SPECT W/WALL MOTION / EF
LV dias vol: 103 mL
LV sys vol: 39 mL
SDS: 1
SRS: 0
SSS: 1
TID: 1.13

## 2016-02-25 DIAGNOSIS — Z0000 Encounter for general adult medical examination without abnormal findings: Secondary | ICD-10-CM | POA: Diagnosis not present

## 2016-02-25 DIAGNOSIS — Z1389 Encounter for screening for other disorder: Secondary | ICD-10-CM | POA: Diagnosis not present

## 2016-02-25 DIAGNOSIS — Z789 Other specified health status: Secondary | ICD-10-CM | POA: Diagnosis not present

## 2016-05-27 DIAGNOSIS — Z79899 Other long term (current) drug therapy: Secondary | ICD-10-CM | POA: Diagnosis not present

## 2016-05-27 DIAGNOSIS — M7989 Other specified soft tissue disorders: Secondary | ICD-10-CM | POA: Diagnosis not present

## 2016-05-27 DIAGNOSIS — R079 Chest pain, unspecified: Secondary | ICD-10-CM | POA: Diagnosis not present

## 2016-05-27 DIAGNOSIS — Z7951 Long term (current) use of inhaled steroids: Secondary | ICD-10-CM | POA: Diagnosis not present

## 2016-05-27 DIAGNOSIS — R200 Anesthesia of skin: Secondary | ICD-10-CM | POA: Diagnosis not present

## 2016-05-27 DIAGNOSIS — Z87891 Personal history of nicotine dependence: Secondary | ICD-10-CM | POA: Diagnosis not present

## 2016-05-27 DIAGNOSIS — R0602 Shortness of breath: Secondary | ICD-10-CM | POA: Diagnosis not present

## 2016-05-27 DIAGNOSIS — R0789 Other chest pain: Secondary | ICD-10-CM | POA: Diagnosis not present

## 2016-05-27 DIAGNOSIS — I10 Essential (primary) hypertension: Secondary | ICD-10-CM | POA: Diagnosis not present

## 2016-05-27 DIAGNOSIS — J449 Chronic obstructive pulmonary disease, unspecified: Secondary | ICD-10-CM | POA: Diagnosis not present

## 2016-05-27 DIAGNOSIS — R05 Cough: Secondary | ICD-10-CM | POA: Diagnosis not present

## 2016-07-24 DIAGNOSIS — I10 Essential (primary) hypertension: Secondary | ICD-10-CM | POA: Diagnosis not present

## 2016-07-24 DIAGNOSIS — M109 Gout, unspecified: Secondary | ICD-10-CM | POA: Diagnosis not present

## 2017-01-16 ENCOUNTER — Emergency Department
Admission: EM | Admit: 2017-01-16 | Discharge: 2017-01-16 | Disposition: A | Discharge status: Home / Self Care | Payer: Medicare Other | Attending: Emergency Medicine | Admitting: Emergency Medicine

## 2017-01-16 ENCOUNTER — Encounter: Payer: Self-pay | Admitting: Emergency Medicine

## 2017-01-16 DIAGNOSIS — Z7982 Long term (current) use of aspirin: Secondary | ICD-10-CM | POA: Insufficient documentation

## 2017-01-16 DIAGNOSIS — Z79899 Other long term (current) drug therapy: Secondary | ICD-10-CM | POA: Insufficient documentation

## 2017-01-16 DIAGNOSIS — F17210 Nicotine dependence, cigarettes, uncomplicated: Secondary | ICD-10-CM | POA: Insufficient documentation

## 2017-01-16 DIAGNOSIS — I10 Essential (primary) hypertension: Secondary | ICD-10-CM | POA: Diagnosis not present

## 2017-01-16 DIAGNOSIS — J449 Chronic obstructive pulmonary disease, unspecified: Secondary | ICD-10-CM | POA: Insufficient documentation

## 2017-01-16 DIAGNOSIS — W57XXXA Bitten or stung by nonvenomous insect and other nonvenomous arthropods, initial encounter: Secondary | ICD-10-CM | POA: Diagnosis not present

## 2017-01-16 DIAGNOSIS — Y999 Unspecified external cause status: Secondary | ICD-10-CM | POA: Insufficient documentation

## 2017-01-16 DIAGNOSIS — S30860A Insect bite (nonvenomous) of lower back and pelvis, initial encounter: Secondary | ICD-10-CM | POA: Diagnosis not present

## 2017-01-16 DIAGNOSIS — Y929 Unspecified place or not applicable: Secondary | ICD-10-CM | POA: Insufficient documentation

## 2017-01-16 DIAGNOSIS — S30861A Insect bite (nonvenomous) of abdominal wall, initial encounter: Secondary | ICD-10-CM | POA: Diagnosis not present

## 2017-01-16 DIAGNOSIS — Y939 Activity, unspecified: Secondary | ICD-10-CM | POA: Diagnosis not present

## 2017-01-16 MED ORDER — DOXYCYCLINE MONOHYDRATE 100 MG PO CAPS
100.0000 mg | ORAL_CAPSULE | Freq: Two times a day (BID) | ORAL | 0 refills | Status: DC
Start: 2017-01-16 — End: 2017-04-17

## 2017-01-16 NOTE — ED Notes (Signed)
See triage note  States he removed a tick from back about 1 week ago  Denies any fever or headache  But noticed some swelling and redness to area

## 2017-01-16 NOTE — ED Provider Notes (Signed)
[facility] Emergency Department Provider Note   ____________________________________________   First MD Initiated Contact with Patient 01/16/17 [phone]     (approximate)  I have reviewed the triage vital signs and the nursing notes.   HISTORY  Chief Complaint Insect Bite    HPI William Murray Sr. is a 66 y.o. male patient presents with redness to the right lateral aspect of his back. Patient state he removed a tick from the area one week ago. Patient denies any fever or arthralgia systems the incident.Patient states the area of redness is spreading. He denies any fever associated this complaint. States no pain with this complaint.   Past Medical History:  Diagnosis Date  . Anginal pain (HCC)   . Arthritis   . COPD (chronic obstructive pulmonary disease) (HCC)   . Erectile disorder due to medical condition in male   . Gout   . Hyperlipidemia   . Hypertension   . Shortness of breath dyspnea     Patient Active Problem List   Diagnosis Date Noted  . Chest pain 04/28/2015    Past Surgical History:  Procedure Laterality Date  . CARDIAC CATHETERIZATION    . TONSILLECTOMY      Prior to Admission medications   Medication Sig Start Date End Date Taking? Authorizing Provider  allopurinol (ZYLOPRIM) 100 MG tablet Take 1 tablet by mouth daily.    Historical Provider, MD  aspirin EC 81 MG tablet Take 1 tablet (81 mg total) by mouth daily. 04/29/15   Houston Siren, MD  atorvastatin (LIPITOR) 10 MG tablet Take 1 tablet by mouth every evening.    Historical Provider, MD  doxycycline (MONODOX) 100 MG capsule Take 1 capsule (100 mg total) by mouth 2 (two) times daily. 01/16/17   Joni Reining, PA-C  losartan-hydrochlorothiazide (HYZAAR) 50-12.5 MG per tablet Take 1 tablet by mouth daily.    Historical Provider, MD    Allergies Patient has no known allergies.  Family History  Problem Relation Age of Onset  . Renal Disease Mother   . Diabetes Mother     . Dementia Father   . COPD Father   . Cervical cancer Sister   . Aneurysm Sister   . Diabetes Sister   . Bladder Cancer Brother     Social History Social History  Substance Use Topics  . Smoking status: Current Every Day Smoker    Packs/day: 0.50    Years: 50.00    Types: Cigarettes    Last attempt to quit: 10/09/2014  . Smokeless tobacco: Never Used  . Alcohol use 12.6 oz/week    21 Cans of beer per week    Review of Systems Constitutional: No fever/chills Eyes: No visual changes. ENT: No sore throat. Cardiovascular: Denies chest pain. Respiratory: Denies shortness of breath. Gastrointestinal: No abdominal pain.  No nausea, no vomiting.  No diarrhea.  No constipation. Genitourinary: Negative for dysuria. Musculoskeletal: Negative for back pain. Skin: Negative for rash. Neurological: Negative for headaches, focal weakness or numbness. Endocrine:Hypertension hyperlipidemia   ____________________________________________   PHYSICAL EXAM:  VITAL SIGNS: ED Triage Vitals   Enc Vitals Group     BP (!) 173/72     Pulse Rate 71     Resp 20     Temp 98.5 F (36.9 C)     Temp Source Oral     SpO2 97 %     Weight 205 lb (93 kg)     Height  (1.778 m)  Head Circumference      Peak Flow      Pain Score      Pain Loc      Pain Edu?      Excl. in GC?     Constitutional: Alert and oriented. Well appearing and in no acute distress. Eyes: Conjunctivae are normal. PERRL. EOMI. Head: Atraumatic. Nose: No congestion/rhinnorhea. Mouth/Throat: Mucous membranes are moist.  Oropharynx non-erythematous. Neck: No stridor.  No cervical spine tenderness to palpation. Hematological/Lymphatic/Immunilogical: No cervical lymphadenopathy. Cardiovascular: Normal rate, regular rhythm. Grossly normal heart sounds.  Good peripheral circulation. Respiratory: Normal respiratory effort.  No retractions. Lungs CTAB. Gastrointestinal: Soft and nontender. No distention. No abdominal  bruits. No CVA tenderness. Musculoskeletal: No lower extremity tenderness nor edema.  No joint effusions. Neurologic:  Normal speech and language. No gross focal neurologic deficits are appreciated. No gait instability. Skin:  Skin is warm, dry and intact. No rash noted. Erythematous macular lesion measures approximately 4 cm. Psychiatric: Mood and affect are normal. Speech and behavior are normal.  ____________________________________________   LABS (all labs ordered are listed, but only abnormal results are displayed)  Labs Reviewed - No data to display ____________________________________________  EKG   ____________________________________________  RADIOLOGY   ____________________________________________   PROCEDURES  Procedure(s) performed: None  Procedures  Critical Care performed: No  ____________________________________________   INITIAL IMPRESSION / ASSESSMENT AND PLAN / ED COURSE  Pertinent labs & imaging results that were available during my care of the patient were reviewed by me and considered in my medical decision making (see chart for details).  Tick bite right flank area.      ____________________________________________   FINAL CLINICAL IMPRESSION(S) / ED DIAGNOSES  Final diagnoses:  Tick bite, initial encounter   Patient given discharge care instructions. Patient prescription for doxycycline. Patient advised follow-up family doctor.   NEW MEDICATIONS STARTED DURING THIS VISIT:  New Prescriptions   DOXYCYCLINE (MONODOX) 100 MG CAPSULE    Take 1 capsule (100 mg total) by mouth 2 (two) times daily.     Note:  This document was prepared using Dragon voice recognition software and may include unintentional dictation errors.    Joni Reining, PA-C 01/16/17 1610    Emily Filbert, MD 01/16/17 [phone]

## 2017-01-16 NOTE — ED Triage Notes (Signed)
Took tick off left back last weekl.   Now with redness around the area

## 2017-04-17 ENCOUNTER — Encounter: Payer: Self-pay | Admitting: Unknown Physician Specialty

## 2017-04-17 ENCOUNTER — Ambulatory Visit (INDEPENDENT_AMBULATORY_CARE_PROVIDER_SITE_OTHER): Payer: Medicare Other | Admitting: Unknown Physician Specialty

## 2017-04-17 VITALS — BP 179/79 | HR 56 | Temp 98.7°F | Ht 69.1 in | Wt 210.5 lb

## 2017-04-17 DIAGNOSIS — F17219 Nicotine dependence, cigarettes, with unspecified nicotine-induced disorders: Secondary | ICD-10-CM | POA: Insufficient documentation

## 2017-04-17 DIAGNOSIS — Z8739 Personal history of other diseases of the musculoskeletal system and connective tissue: Secondary | ICD-10-CM | POA: Diagnosis not present

## 2017-04-17 DIAGNOSIS — Z87891 Personal history of nicotine dependence: Secondary | ICD-10-CM | POA: Insufficient documentation

## 2017-04-17 DIAGNOSIS — I10 Essential (primary) hypertension: Secondary | ICD-10-CM | POA: Diagnosis not present

## 2017-04-17 DIAGNOSIS — M1A9XX0 Chronic gout, unspecified, without tophus (tophi): Secondary | ICD-10-CM | POA: Insufficient documentation

## 2017-04-17 DIAGNOSIS — Z720 Tobacco use: Secondary | ICD-10-CM | POA: Diagnosis not present

## 2017-04-17 DIAGNOSIS — Z1159 Encounter for screening for other viral diseases: Secondary | ICD-10-CM | POA: Diagnosis not present

## 2017-04-17 DIAGNOSIS — E7800 Pure hypercholesterolemia, unspecified: Secondary | ICD-10-CM

## 2017-04-17 LAB — LIPID PANEL PICCOLO, WAIVED
Chol/HDL Ratio Piccolo,Waive: 5.2 mg/dL — ABNORMAL HIGH
Cholesterol Piccolo, Waived: 158 mg/dL (ref <200)
HDL Chol Piccolo, Waived: 30 mg/dL — ABNORMAL LOW (ref >59)
LDL Chol Calc Piccolo Waived: 92 mg/dL (ref <100)
Triglycerides Piccolo,Waived: 176 mg/dL — ABNORMAL HIGH (ref <150)
VLDL Chol Calc Piccolo,Waive: 35 mg/dL — ABNORMAL HIGH (ref <30)

## 2017-04-17 MED ORDER — LOSARTAN POTASSIUM-HCTZ 50-12.5 MG PO TABS: 1.0000 | ORAL_TABLET | Freq: Every day | ORAL | 1 refills | Status: DC

## 2017-04-17 MED ORDER — ATORVASTATIN CALCIUM 10 MG PO TABS: 10.0000 mg | ORAL_TABLET | Freq: Every evening | ORAL | 1 refills | Status: DC

## 2017-04-17 MED ORDER — ASPIRIN 81 MG PO TABS: 81.0000 mg | ORAL_TABLET | Freq: Every day | ORAL | 12 refills | Status: AC

## 2017-04-17 NOTE — Assessment & Plan Note (Signed)
Encouraged to quit smoking.  

## 2017-04-17 NOTE — Assessment & Plan Note (Signed)
No flares for a year.  Hold Allopurinol

## 2017-04-17 NOTE — Progress Notes (Signed)
BP (!) 179/79   Pulse (!) 56   Temp 98.7 F (37.1 C)   Ht 5' 9.1" (1.755 m)   Wt 210 lb 8 oz (95.5 kg)   SpO2 97%   BMI 31.00 kg/m    Subjective:    Patient ID: William Lions Sr., male    DOB: 1951/07/05, 66 y.o.   MRN: [id number]  HPI: William Murray. is a 66 y.o. male  Chief Complaint  Patient presents with  . Establish Care   This pt is from [facility].  He has Atorvastatin, Allopurinol, Proaire, QVar and Hyzaar with him but hasn't taken them. States he got frustrated with [facility] due to lack of f/u.    Hypertension States his BP is pretty good when he takes his medication Average home BPs : States it is variable and be as low as BP 148.    No problems or lightheadedness No chest pain with exertion or shortness of breath No Edema  Hyperlipidemia Not taking Atorvastatin but tolerates when he takes it.   No Muscle aches   COPD Pt has inhalers but denies SOB.  He states he smokes intermittently. States these were started several years ago.     Reviewed notes.  In the hospital 2016 for chest pain with a negative stress test.     Depression screen [facility] 2/9 04/17/2017  Decreased Interest 0  Down, Depressed, Hopeless 0  PHQ - 2 Score 0   Family History  Problem Relation Age of Onset  . Renal Disease Mother   . Diabetes Mother   . Dementia Father   . COPD Father   . Emphysema Father   . Cervical cancer Sister   . Diabetes Sister   . Bladder Cancer Brother   . Hypertension Son   . Heart disease Son   . Heart disease Maternal Grandmother   . Heart disease Maternal Grandfather   . Heart disease Paternal Grandmother   . Heart disease Paternal Grandfather   . Aneurysm Sister   . Hypertension Son    Social History   Social History  . Marital status: Married    Spouse name: N/A  . Number of children: N/A  . Years of education: N/A   Occupational History  . Not on file.   Social History Main Topics  . Smoking status: Current Some Day  Smoker    Packs/day: 0.00    Years: 50.00    Types: Cigarettes    Last attempt to quit: 10/09/2014  . Smokeless tobacco: Never Used  . Alcohol use 7.2 oz/week    6 Cans of beer, 6 Shots of liquor per week  . Drug use: No  . Sexual activity: Not Currently   Other Topics Concern  . Not on file   Social History Narrative  . No narrative on file   Past Medical History:  Diagnosis Date  . Anginal pain (HCC)   . Arthritis   . COPD (chronic obstructive pulmonary disease) (HCC)   . Erectile disorder due to medical condition in male   . Gout   . Hyperlipidemia   . Hypertension   . Shortness of breath dyspnea    Past Surgical History:  Procedure Laterality Date  . arm surgery Left   . CARDIAC CATHETERIZATION    . TONSILLECTOMY      Relevant past medical, surgical, family and social history reviewed and updated as indicated. Interim medical history since our last visit reviewed. Allergies and medications reviewed  and updated.  Review of Systems  Per HPI unless specifically indicated above     Objective:    BP (!) 179/79   Pulse (!) 56   Temp 98.7 F (37.1 C)   Ht 5' 9.1" (1.755 m)   Wt 210 lb 8 oz (95.5 kg)   SpO2 97%   BMI 31.00 kg/m   Wt Readings from Last 3 Encounters:  04/17/17 210 lb 8 oz (95.5 kg)  01/16/17 205 lb (93 kg)  04/28/15 202 lb 4.8 oz (91.8 kg)    Physical Exam  Constitutional: He is oriented to person, place, and time. He appears well-developed and well-nourished. No distress.  HENT:  Head: Normocephalic and atraumatic.  Eyes: Conjunctivae and lids are normal. Right eye exhibits no discharge. Left eye exhibits no discharge. No scleral icterus.  Neck: Normal range of motion. Neck supple. No JVD present. Carotid bruit is not present.  Cardiovascular: Normal rate, regular rhythm and normal heart sounds.   Pulmonary/Chest: Effort normal and breath sounds normal. No respiratory distress.  Abdominal: Normal appearance. There is no splenomegaly or  hepatomegaly.  Musculoskeletal: Normal range of motion.  Neurological: He is alert and oriented to person, place, and time.  Skin: Skin is warm, dry and intact. No rash noted. No pallor.  Psychiatric: He has a normal mood and affect. His behavior is normal. Judgment and thought content normal.     Assessment & Plan:   Problem List Items Addressed This Visit      Unprioritized   Essential hypertension, benign    Restart Losartan/HCTZ      Relevant Medications   aspirin 81 MG tablet   losartan-hydrochlorothiazide (HYZAAR) 50-12.5 MG tablet   atorvastatin (LIPITOR) 10 MG tablet   Other Relevant Orders   Lipid Panel Piccolo, Waived   Comprehensive metabolic panel   History of gout    No flares for a year.  Hold Allopurinol      Relevant Orders   Uric acid   Hypercholesteremia    ASCVD calculator showing high risk.  Restart Atorvastatin.  Start ASA      Relevant Medications   aspirin 81 MG tablet   losartan-hydrochlorothiazide (HYZAAR) 50-12.5 MG tablet   atorvastatin (LIPITOR) 10 MG tablet   Tobacco abuse    Encouraged to quit smoking       Other Visit Diagnoses    Need for hepatitis C screening test    -  Primary   Relevant Orders   Hepatitis C antibody       Follow up plan: Return in about 4 weeks (around 05/15/2017) for BP and Spirometry.

## 2017-04-17 NOTE — Assessment & Plan Note (Addendum)
ASCVD calculator showing high risk.  Restart Atorvastatin.  Start ASA

## 2017-04-17 NOTE — Assessment & Plan Note (Signed)
Restart Losartan/HCTZ

## 2017-04-18 ENCOUNTER — Encounter: Payer: Self-pay | Admitting: Unknown Physician Specialty

## 2017-04-18 LAB — COMPREHENSIVE METABOLIC PANEL
ALT: 20 IU/L (ref 0–44)
AST: 33 IU/L (ref 0–40)
Albumin/Globulin Ratio: 1.7 (ref 1.2–2.2)
Albumin: 4.5 g/dL (ref 3.6–4.8)
Alkaline Phosphatase: 86 IU/L (ref 39–117)
BUN/Creatinine Ratio: 13 (ref 10–24)
BUN: 12 mg/dL (ref 8–27)
Bilirubin Total: 0.6 mg/dL (ref 0.0–1.2)
CO2: 19 mmol/L — ABNORMAL LOW (ref 20–29)
Calcium: 9.4 mg/dL (ref 8.6–10.2)
Chloride: 104 mmol/L (ref 96–106)
Creatinine, Ser: 0.93 mg/dL (ref 0.76–1.27)
GFR calc Af Amer: 98 mL/min/{1.73_m2} (ref >59)
GFR calc non Af Amer: 85 mL/min/{1.73_m2} (ref >59)
Globulin, Total: 2.7 g/dL (ref 1.5–4.5)
Glucose: 99 mg/dL (ref 65–99)
Potassium: 4.1 mmol/L (ref 3.5–5.2)
Sodium: 140 mmol/L (ref 134–144)
Total Protein: 7.2 g/dL (ref 6.0–8.5)

## 2017-04-18 LAB — URIC ACID: Uric Acid: 7.5 mg/dL (ref 3.7–8.6)

## 2017-04-18 LAB — HEPATITIS C ANTIBODY: Hep C Virus Ab: <0.1 s/co ratio (ref 0.0–0.9)

## 2017-05-04 ENCOUNTER — Ambulatory Visit (INDEPENDENT_AMBULATORY_CARE_PROVIDER_SITE_OTHER): Payer: Medicare Other

## 2017-05-04 VITALS — BP 145/85 | HR 60 | Temp 98.0°F | Resp 16 | Ht 69.0 in | Wt 211.2 lb

## 2017-05-04 DIAGNOSIS — Z0000 Encounter for general adult medical examination without abnormal findings: Secondary | ICD-10-CM | POA: Diagnosis not present

## 2017-05-04 DIAGNOSIS — Z1211 Encounter for screening for malignant neoplasm of colon: Secondary | ICD-10-CM | POA: Diagnosis not present

## 2017-05-04 NOTE — Patient Instructions (Addendum)
William Murray , Thank you for taking time to come for your Medicare Wellness Visit. I appreciate your ongoing commitment to your health goals. Please review the following plan we discussed and let me know if I can assist you in the future.   Screening recommendations/referrals: Colonoscopy: Due Recommended yearly ophthalmology/optometry visit for glaucoma screening and checkup Recommended yearly dental visit for hygiene and checkup  Vaccinations: Influenza vaccine: Due 06/2017 Pneumococcal vaccine: up to date Tdap vaccine: up to date Shingles vaccine: up to date  Advanced directives: Advance directive discussed with you today. I have provided a copy for you to complete at home and have notarized. Once this is complete please bring a copy in to our office so we can scan it into your chart.  Conditions/risks identified: Smoking cessation discussed  Next appointment: Follow up on 05/18/2017 at 8:30am with Phineas Inchesheryl Wicker,NP. Follow up in one year for your annual wellness exam.  Preventive Care 65 Years and Older, Male Preventive care refers to lifestyle choices and visits with your health care provider that can promote health and wellness. What does preventive care include?  A yearly physical exam. This is also called an annual well check.  Dental exams once or twice a year.  Routine eye exams. Ask your health care provider how often you should have your eyes checked.  Personal lifestyle choices, including:  Daily care of your teeth and gums.  Regular physical activity.  Eating a healthy diet.  Avoiding tobacco and drug use.  Limiting alcohol use.  Practicing safe sex.  Taking low doses of aspirin every day.  Taking vitamin and mineral supplements as recommended by your health care provider. What happens during an annual well check? The services and screenings done by your health care provider during your annual well check will depend on your age, overall health, lifestyle risk  factors, and family history of disease. Counseling  Your health care provider may ask you questions about your:  Alcohol use.  Tobacco use.  Drug use.  Emotional well-being.  Home and relationship well-being.  Sexual activity.  Eating habits.  History of falls.  Memory and ability to understand (cognition).  Work and work Astronomerenvironment. Screening  You may have the following tests or measurements:  Height, weight, and BMI.  Blood pressure.  Lipid and cholesterol levels. These may be checked every 5 years, or more frequently if you are over 68 years old.  Skin check.  Lung cancer screening. You may have this screening every year starting at age 68 if you have a 30-pack-year history of smoking and currently smoke or have quit within the past 15 years.  Fecal occult blood test (FOBT) of the stool. You may have this test every year starting at age 68.  Flexible sigmoidoscopy or colonoscopy. You may have a sigmoidoscopy every 5 years or a colonoscopy every 10 years starting at age 68.  Prostate cancer screening. Recommendations will vary depending on your family history and other risks.  Hepatitis C blood test.  Hepatitis B blood test.  Sexually transmitted disease (STD) testing.  Diabetes screening. This is done by checking your blood sugar (glucose) after you have not eaten for a while (fasting). You may have this done every 1-3 years.  Abdominal aortic aneurysm (AAA) screening. You may need this if you are a current or former smoker.  Osteoporosis. You may be screened starting at age 170 if you are at high risk. Talk with your health care provider about your test results, treatment options,  and if necessary, the need for more tests. Vaccines  Your health care provider may recommend certain vaccines, such as:  Influenza vaccine. This is recommended every year.  Tetanus, diphtheria, and acellular pertussis (Tdap, Td) vaccine. You may need a Td booster every 10  years.  Zoster vaccine. You may need this after age 14.  Pneumococcal 13-valent conjugate (PCV13) vaccine. One dose is recommended after age 1.  Pneumococcal polysaccharide (PPSV23) vaccine. One dose is recommended after age 46. Talk to your health care provider about which screenings and vaccines you need and how often you need them. This information is not intended to replace advice given to you by your health care provider. Make sure you discuss any questions you have with your health care provider. Document Released: 10/22/2015 Document Revised: 06/14/2016 Document Reviewed: 07/27/2015 Elsevier Interactive Patient Education  2017 [place] Prevention in the Home Falls can cause injuries. They can happen to people of all ages. There are many things you can do to make your home safe and to help prevent falls. What can I do on the outside of my home?  Regularly fix the edges of walkways and driveways and fix any cracks.  Remove anything that might make you trip as you walk through a door, such as a raised step or threshold.  Trim any bushes or trees on the path to your home.  Use bright outdoor lighting.  Clear any walking paths of anything that might make someone trip, such as rocks or tools.  Regularly check to see if handrails are loose or broken. Make sure that both sides of any steps have handrails.  Any raised decks and porches should have guardrails on the edges.  Have any leaves, snow, or ice cleared regularly.  Use sand or salt on walking paths during winter.  Clean up any spills in your garage right away. This includes oil or grease spills. What can I do in the bathroom?  Use night lights.  Install grab bars by the toilet and in the tub and shower. Do not use towel bars as grab bars.  Use non-skid mats or decals in the tub or shower.  If you need to sit down in the shower, use a plastic, non-slip stool.  Keep the floor dry. Clean up any water that  spills on the floor as soon as it happens.  Remove soap buildup in the tub or shower regularly.  Attach bath mats securely with double-sided non-slip rug tape.  Do not have throw rugs and other things on the floor that can make you trip. What can I do in the bedroom?  Use night lights.  Make sure that you have a light by your bed that is easy to reach.  Do not use any sheets or blankets that are too big for your bed. They should not hang down onto the floor.  Have a firm chair that has side arms. You can use this for support while you get dressed.  Do not have throw rugs and other things on the floor that can make you trip. What can I do in the kitchen?  Clean up any spills right away.  Avoid walking on wet floors.  Keep items that you use a lot in easy-to-reach places.  If you need to reach something above you, use a strong step stool that has a grab bar.  Keep electrical cords out of the way.  Do not use floor polish or wax that makes floors slippery. If  you must use wax, use non-skid floor wax.  Do not have throw rugs and other things on the floor that can make you trip. What can I do with my stairs?  Do not leave any items on the stairs.  Make sure that there are handrails on both sides of the stairs and use them. Fix handrails that are broken or loose. Make sure that handrails are as long as the stairways.  Check any carpeting to make sure that it is firmly attached to the stairs. Fix any carpet that is loose or worn.  Avoid having throw rugs at the top or bottom of the stairs. If you do have throw rugs, attach them to the floor with carpet tape.  Make sure that you have a light switch at the top of the stairs and the bottom of the stairs. If you do not have them, ask someone to add them for you. What else can I do to help prevent falls?  Wear shoes that:  Do not have high heels.  Have rubber bottoms.  Are comfortable and fit you well.  Are closed at the  toe. Do not wear sandals.  If you use a stepladder:  Make sure that it is fully opened. Do not climb a closed stepladder.  Make sure that both sides of the stepladder are locked into place.  Ask someone to hold it for you, if possible.  Clearly mark and make sure that you can see:  Any grab bars or handrails.  First and last steps.  Where the edge of each step is.  Use tools that help you move around (mobility aids) if they are needed. These include:  Canes.  Walkers.  Scooters.  Crutches.  Turn on the lights when you go into a dark area. Replace any light bulbs as soon as they burn out.  Set up your furniture so you have a clear path. Avoid moving your furniture around.  If any of your floors are uneven, fix them.  If there are any pets around you, be aware of where they are.  Review your medicines with your doctor. Some medicines can make you feel dizzy. This can increase your chance of falling. Ask your doctor what other things that you can do to help prevent falls. This information is not intended to replace advice given to you by your health care provider. Make sure you discuss any questions you have with your health care provider. Document Released: 07/22/2009 Document Revised: 03/02/2016 Document Reviewed: 10/30/2014 Elsevier Interactive Patient Education  2017 [employer].

## 2017-05-04 NOTE — Progress Notes (Signed)
Subjective:   William LionsJohnnie N Vivanco Sr. is a 66 y.o. male who presents for Medicare Annual/Subsequent preventive examination.  Review of Systems:   Cardiac Risk Factors include: male gender;advanced age (>2255men, 34>65 women);obesity (BMI >30kg/m2);hypertension;dyslipidemia     Objective:    Vitals: BP (!) 145/85 (BP Location: Left Arm, Patient Position: Sitting)   Pulse 60   Temp 98 F (36.7 C)   Resp 16   Ht 5\' 9"  (1.753 m)   Wt 211 lb 3.2 oz (95.8 kg)   BMI 31.19 kg/m   Body mass index is 31.19 kg/m.  Tobacco History  Smoking Status  . Current Some Day Smoker  . Packs/day: 0.00  . Years: 50.00  . Types: Cigarettes  . Last attempt to quit: 10/09/2014  Smokeless Tobacco  . Never Used    Comment: Smokes occasioanlly      Ready to quit: Yes Counseling given: Yes   Past Medical History:  Diagnosis Date  . Anginal pain (HCC)   . Arthritis   . COPD (chronic obstructive pulmonary disease) (HCC)   . Erectile disorder due to medical condition in male   . Gout   . Hyperlipidemia   . Hypertension   . Shortness of breath dyspnea    Past Surgical History:  Procedure Laterality Date  . arm surgery Left   . CARDIAC CATHETERIZATION    . TONSILLECTOMY     Family History  Problem Relation Age of Onset  . Renal Disease Mother   . Diabetes Mother   . Dementia Father   . COPD Father   . Emphysema Father   . Cervical cancer Sister   . Diabetes Sister   . Bladder Cancer Brother   . Hypertension Son   . Heart disease Son   . Heart disease Maternal Grandmother   . Heart disease Maternal Grandfather   . Heart disease Paternal Grandmother   . Heart disease Paternal Grandfather   . Aneurysm Sister   . Hypertension Son    History  Sexual Activity  . Sexual activity: Not Currently    Outpatient Encounter Prescriptions as of 05/04/2017  Medication Sig  . albuterol (PROVENTIL HFA;VENTOLIN HFA) 108 (90 Base) MCG/ACT inhaler Inhale 2 puffs into the lungs every 6 (six) hours as  needed for wheezing or shortness of breath.  Marland Kitchen. aspirin 81 MG tablet Take 1 tablet (81 mg total) by mouth daily.  Marland Kitchen. atorvastatin (LIPITOR) 10 MG tablet Take 1 tablet (10 mg total) by mouth every evening.  Marland Kitchen. losartan-hydrochlorothiazide (HYZAAR) 50-12.5 MG tablet Take 1 tablet by mouth daily.   No facility-administered encounter medications on file as of 05/04/2017.     Activities of Daily Living In your present state of health, do you have any difficulty performing the following activities: 05/04/2017 04/17/2017  Hearing? William JohnsY Y  Vision? N Y  Difficulty concentrating or making decisions? N N  Walking or climbing stairs? N N  Dressing or bathing? N N  Doing errands, shopping? N N  Preparing Food and eating ? N -  Using the Toilet? N -  In the past six months, have you accidently leaked urine? N -  Do you have problems with loss of bowel control? N -  Managing your Medications? N -  Managing your Finances? N -  Housekeeping or managing your Housekeeping? N -  Some recent data might be hidden    Patient Care Team: Gabriel CirriWicker, Cheryl, NP as PCP - General (Nurse Practitioner)   Assessment:  Exercise Activities and Dietary recommendations Current Exercise Habits: Home exercise routine, Type of exercise: walking, Time (Minutes): 60, Frequency (Times/Week): 4, Weekly Exercise (Minutes/Week): 240, Intensity: Mild  Goals    . Quit smoking / using tobacco    . Quit smoking / using tobacco          Smoking cessation discussed      Fall Risk Fall Risk  05/04/2017  Falls in the past year? No   Depression Screen PHQ 2/9 Scores 05/04/2017 04/17/2017  PHQ - 2 Score 0 0    Cognitive Function     6CIT Screen 05/04/2017  What Year? 0 points  What month? 0 points  What time? 0 points  Count back from 20 0 points  Months in reverse 0 points  Repeat phrase 0 points  Total Score 0    Immunization History  Administered Date(s) Administered  . Pneumococcal Conjugate-13 10/19/2013  .  Pneumococcal Polysaccharide-23 10/19/2014  . Td 12/15/2009  . Zoster 10/19/2014   Screening Tests Health Maintenance  Topic Date Due  . COLONOSCOPY  06/09/2017 (Originally 07/16/1999)  . INFLUENZA VACCINE  05/09/2017  . TETANUS/TDAP  12/16/2019  . Hepatitis C Screening  Completed  . PNA vac Low Risk Adult  Completed      Plan:    I have personally reviewed and addressed the Medicare Annual Wellness questionnaire and have noted the following in the patient's chart:  A. Medical and social history B. Use of alcohol, tobacco or illicit drugs  C. Current medications and supplements D. Functional ability and status E.  Nutritional status F.  Physical activity G. Advance directives H. List of other physicians I.  Hospitalizations, surgeries, and ER visits in previous 12 months J.  Vitals K. Screenings such as hearing and vision if needed, cognitive and depression L. Referrals and appointments   In addition, I have reviewed and discussed with patient certain preventive protocols, quality metrics, and best practice recommendations. A written personalized care plan for preventive services as well as general preventive health recommendations were provided to patient.   Signed,  Marin Robertsiffany Nicklaus Alviar, LPN Nurse Health Advisor   MD Recommendations: none

## 2017-05-18 ENCOUNTER — Ambulatory Visit (INDEPENDENT_AMBULATORY_CARE_PROVIDER_SITE_OTHER): Payer: Medicare Other | Admitting: Unknown Physician Specialty

## 2017-05-18 ENCOUNTER — Encounter: Payer: Self-pay | Admitting: Unknown Physician Specialty

## 2017-05-18 VITALS — BP 138/75 | HR 56 | Temp 97.4°F | Wt 210.2 lb

## 2017-05-18 DIAGNOSIS — I10 Essential (primary) hypertension: Secondary | ICD-10-CM

## 2017-05-18 DIAGNOSIS — R9431 Abnormal electrocardiogram [ECG] [EKG]: Secondary | ICD-10-CM | POA: Diagnosis not present

## 2017-05-18 DIAGNOSIS — J432 Centrilobular emphysema: Secondary | ICD-10-CM | POA: Insufficient documentation

## 2017-05-18 DIAGNOSIS — E7800 Pure hypercholesterolemia, unspecified: Secondary | ICD-10-CM

## 2017-05-18 DIAGNOSIS — J449 Chronic obstructive pulmonary disease, unspecified: Secondary | ICD-10-CM

## 2017-05-18 DIAGNOSIS — R0789 Other chest pain: Secondary | ICD-10-CM | POA: Diagnosis not present

## 2017-05-18 NOTE — Assessment & Plan Note (Signed)
Normal Spirometry today

## 2017-05-18 NOTE — Progress Notes (Signed)
BP 138/75 (BP Location: Left Arm, Cuff Size: Normal)   Pulse (!) 56   Temp (!) 97.4 F (36.3 C)   Wt 210 lb 3.2 oz (95.3 kg)   SpO2 95%   BMI 31.04 kg/m    Subjective:    Patient ID: William LionsJohnnie N Muecke Sr., male    DOB: 11/03/1950, 66 y.o.   MRN: [id number]  HPI: William GoldsJohnnie N Ladson Sr. is a 66 y.o. male  Chief Complaint  Patient presents with  . Hypertension  . COPD   SOB Taking Albuterol daily.  Had a spell of SOB last night.  States he could not breath and felt his throat shut down.  Spirometry was normal.  Hopitalized 2016 for chest pain. Chart review showed normal work-up.    COPD Daily Albuterol.  Besides episode above, no SOB  Hypertension Started Losartan/HCTZ last visit.  Using medications without difficulty Average home BPs No checking  No problems or lightheadedness No Edema  Relevant past medical, surgical, family and social history reviewed and updated as indicated. Interim medical history since our last visit reviewed. Allergies and medications reviewed and updated.  Review of Systems  Per HPI unless specifically indicated above     Objective:    BP 138/75 (BP Location: Left Arm, Cuff Size: Normal)   Pulse (!) 56   Temp (!) 97.4 F (36.3 C)   Wt 210 lb 3.2 oz (95.3 kg)   SpO2 95%   BMI 31.04 kg/m   Wt Readings from Last 3 Encounters:  05/18/17 210 lb 3.2 oz (95.3 kg)  05/04/17 211 lb 3.2 oz (95.8 kg)  04/17/17 210 lb 8 oz (95.5 kg)    Physical Exam  Constitutional: He is oriented to person, place, and time. He appears well-developed and well-nourished. No distress.  HENT:  Head: Normocephalic and atraumatic.  Eyes: Conjunctivae and lids are normal. Right eye exhibits no discharge. Left eye exhibits no discharge. No scleral icterus.  Neck: Normal range of motion. Neck supple. No JVD present. Carotid bruit is not present.  Cardiovascular: Normal rate, regular rhythm and normal heart sounds.   Pulmonary/Chest: Effort normal and breath sounds  normal. No respiratory distress.  Abdominal: Normal appearance. There is no splenomegaly or hepatomegaly.  Musculoskeletal: Normal range of motion.  Neurological: He is alert and oriented to person, place, and time.  Skin: Skin is warm, dry and intact. No rash noted. No pallor.  Psychiatric: He has a normal mood and affect. His behavior is normal. Judgment and thought content normal.   EKG normal sinus rhythm.  No acute changes but V4 and V6 show change from previous.    Spirometry normal  Results for orders placed or performed in visit on 04/17/17  Lipid Panel Piccolo, Arrow ElectronicsWaived  Result Value Ref Range   Cholesterol Piccolo, Waived 158 <200 mg/dL   HDL Chol Piccolo, Waived 30 (L) >59 mg/dL   Triglycerides Piccolo,Waived 176 (H) <150 mg/dL   Chol/HDL Ratio Piccolo,Waive 5.2 (H) mg/dL   LDL Chol Calc Piccolo Waived 92 <100 mg/dL   VLDL Chol Calc Piccolo,Waive 35 (H) <30 mg/dL  Comprehensive metabolic panel  Result Value Ref Range   Glucose 99 65 - 99 mg/dL   BUN 12 8 - 27 mg/dL   Creatinine, Ser 3.660.93 0.76 - 1.27 mg/dL   GFR calc non Af Amer 85 >59 mL/min/1.73   GFR calc Af Amer 98 >59 mL/min/1.73   BUN/Creatinine Ratio 13 10 - 24   Sodium 140 134 - 144 mmol/L  Potassium 4.1 3.5 - 5.2 mmol/L   Chloride 104 96 - 106 mmol/L   CO2 19 (L) 20 - 29 mmol/L   Calcium 9.4 8.6 - 10.2 mg/dL   Total Protein 7.2 6.0 - 8.5 g/dL   Albumin 4.5 3.6 - 4.8 g/dL   Globulin, Total 2.7 1.5 - 4.5 g/dL   Albumin/Globulin Ratio 1.7 1.2 - 2.2   Bilirubin Total 0.6 0.0 - 1.2 mg/dL   Alkaline Phosphatase 86 39 - 117 IU/L   AST 33 0 - 40 IU/L   ALT 20 0 - 44 IU/L  Hepatitis C antibody  Result Value Ref Range   Hep C Virus Ab <0.1 0.0 - 0.9 s/co ratio  Uric acid  Result Value Ref Range   Uric Acid 7.5 3.7 - 8.6 mg/dL      Assessment & Plan:   Problem List Items Addressed This Visit      Unprioritized   Essential hypertension, benign    Controlled after restarting BP meds       Hypercholesteremia    Tolerating Atorvastatin      Stage 1 mild COPD by GOLD classification (HCC)    Normal Spirometry today       Other Visit Diagnoses    Chronic obstructive pulmonary disease, unspecified COPD type (HCC)    -  Primary   Relevant Orders   Spirometry with Graph (Completed)   Chest tightness or pressure       BP improved.  Taking ASA. Started Atorvastatin.  EKG changes.  Refer to cardiology for further evaluation.  ? related to apneic episode in sleep   Relevant Orders   EKG 12-Lead (Completed)   Ambulatory referral to Cardiology   EKG abnormalities       Poor r wave pregression which is a change from previous EKG in 2016   Relevant Orders   Ambulatory referral to Cardiology       Follow up plan: Return in about 3 months (around 08/18/2017).

## 2017-05-18 NOTE — Assessment & Plan Note (Signed)
Controlled after restarting BP meds

## 2017-05-18 NOTE — Assessment & Plan Note (Signed)
Tolerating Atorvastatin 

## 2017-05-21 ENCOUNTER — Other Ambulatory Visit: Payer: Self-pay

## 2017-05-21 ENCOUNTER — Telehealth: Payer: Self-pay

## 2017-05-21 DIAGNOSIS — Z1211 Encounter for screening for malignant neoplasm of colon: Secondary | ICD-10-CM

## 2017-05-21 NOTE — Telephone Encounter (Signed)
Gastroenterology Pre-Procedure Review  Request Date: 8/23 Requesting Physician: Dr. Allegra LaiVanga  PATIENT REVIEW QUESTIONS: The patient responded to the following health history questions as indicated:    *No major health illnesses at this time.  1. Are you having any GI issues? no 2. Do you have a personal history of Polyps? no 3. Do you have a family history of Colon Cancer or Polyps? no 4. Diabetes Mellitus? no 5. Joint replacements in the past 12 months?no 6. Major health problems in the past 3 months?no 7. Any artificial heart valves, MVP, or defibrillator?no    MEDICATIONS & ALLERGIES:    Patient reports the following regarding taking any anticoagulation/antiplatelet therapy:   Plavix, Coumadin, Eliquis, Xarelto, Lovenox, Pradaxa, Brilinta, or Effient? no Aspirin? yes (6245m g)  Patient confirms/reports the following medications:  Current Outpatient Prescriptions  Medication Sig Dispense Refill  . albuterol (PROVENTIL HFA;VENTOLIN HFA) 108 (90 Base) MCG/ACT inhaler Inhale 2 puffs into the lungs every 6 (six) hours as needed for wheezing or shortness of breath.    Marland Kitchen. aspirin 81 MG tablet Take 1 tablet (81 mg total) by mouth daily. 30 tablet 12  . atorvastatin (LIPITOR) 10 MG tablet Take 1 tablet (10 mg total) by mouth every evening. 90 tablet 1  . losartan-hydrochlorothiazide (HYZAAR) 50-12.5 MG tablet Take 1 tablet by mouth daily. 90 tablet 1   No current facility-administered medications for this visit.     Patient confirms/reports the following allergies:  No Known Allergies  No orders of the defined types were placed in this encounter.   AUTHORIZATION INFORMATION Primary Insurance: 1D#: Group #:  Secondary Insurance: 1D#: Group #:  SCHEDULE INFORMATION: Date: 05/31/17 Time: Location: [facility]

## 2017-05-28 ENCOUNTER — Telehealth: Payer: Self-pay

## 2017-05-28 ENCOUNTER — Telehealth: Payer: Self-pay | Admitting: Gastroenterology

## 2017-05-28 ENCOUNTER — Other Ambulatory Visit: Payer: Self-pay

## 2017-05-28 DIAGNOSIS — Z1211 Encounter for screening for malignant neoplasm of colon: Secondary | ICD-10-CM

## 2017-05-28 NOTE — Telephone Encounter (Signed)
Please call  patient's wife as they are unable to afford the prep kit and need to r/s procedure.

## 2017-05-28 NOTE — Telephone Encounter (Signed)
LVM for pt to contact office to see if he would like an alternative prescription called to the pharmacy for his bowel prep.  I am awaiting call back before cancelling his Colonoscopy.

## 2017-05-28 NOTE — Telephone Encounter (Signed)
Pts colonoscopy has been rescheduled to 06/22/17 Friday Dr. Servando Snare at [facility] and bowel prep changed to golytely .  This change is due to cost of prescription and date availability.  Rosann Auerbach has been informed to cancel 05/31/17 Colonoscopy and I've ordered 06/22/17 Colonoscopy at [facility].  Informed wife that she will receive 2 phone calls one from billing and one from triage nurse to let her know when her husband needs to arrive.  Gave new instructions for bowel prep: begin drinking at 5pm 8oz every 20 mins until bowels are clear. She has been asked to call back if she is unclear on anything.

## 2017-05-31 ENCOUNTER — Ambulatory Visit: Admit: 2017-05-31 | Payer: Medicare Other

## 2017-05-31 SURGERY — COLONOSCOPY WITH PROPOFOL
Anesthesia: General

## 2017-06-20 ENCOUNTER — Encounter: Payer: Self-pay | Admitting: Anesthesiology

## 2017-06-28 ENCOUNTER — Ambulatory Visit: Admit: 2017-06-28 | Payer: Medicare Other | Admitting: Gastroenterology

## 2017-06-28 SURGERY — COLONOSCOPY WITH PROPOFOL
Anesthesia: Choice

## 2017-07-21 NOTE — Progress Notes (Signed)
Cardiology Office Note  Date:  07/23/2017   ID:  William Lions Sr., DOB Jul 19, 1951, MRN [id number]  PCP:  William Cirri, NP   Chief Complaint  Patient presents with  . other    New patient. Patient referred for Chest tightness and pressure. Meds reviewed verbally with patient.     HPI:  [name] is a 66 year old gentleman with past medical history of Hypertension Ectopy, for years Quit smoking, age 10s Chronic shortness of breath, COPD Who presents by referral from William Murray for evaluation of abnormal EKG  Reports that sometimes when he walks he is able to walk a long distance with no symptoms Occasionally has shortness of breath when he walks and has to stop and catch his breath Seems to come and go, has been this way for many years no significant change Takes albuterol Rare chest pain,  Had shingles in same area  Checks his blood pressure occasionally, reports it is well controlled Reports having rare palpitations, seems to present at nighttime, a strong beat followed by a pause then regular beats  No leg swelling, no PND orthopnea Wife reports he is sedentary, she works and he will sit around most of the day  Lab work reviewed with him in detail Total chol 158 Stress test 2016, no ischemia, for chest pain  Prior cardiac cath, done at [facility], results not available LE arterial Doppler 03/21/2012: normal  CT scan chest July 2015 reviewed with him showing no significant coronary calcifications, no aortic atherosclerosis, no carotid plaque  EKG personally reviewed by myself on todays visit Shows normal sinus rhythm rate 59 bpm rare APC, PVC otherwise no significant ST or T-wave changes   PMH:   has a past medical history of Anginal pain (HCC); Arthritis; COPD (chronic obstructive pulmonary disease) (HCC); Erectile disorder due to medical condition in male; Gout; Hyperlipidemia; Hypertension; and Shortness of breath dyspnea.  PSH:    Past Surgical History:   Procedure Laterality Date  . arm surgery Left   . CARDIAC CATHETERIZATION    . TONSILLECTOMY      Current Outpatient Prescriptions  Medication Sig Dispense Refill  . albuterol (PROVENTIL HFA;VENTOLIN HFA) 108 (90 Base) MCG/ACT inhaler Inhale 2 puffs into the lungs every 6 (six) hours as needed for wheezing or shortness of breath.    Marland Kitchen aspirin 81 MG tablet Take 1 tablet (81 mg total) by mouth daily. 30 tablet 12  . atorvastatin (LIPITOR) 10 MG tablet Take 1 tablet (10 mg total) by mouth every evening. 90 tablet 1  . losartan-hydrochlorothiazide (HYZAAR) 50-12.5 MG tablet Take 1 tablet by mouth daily. 90 tablet 1   No current facility-administered medications for this visit.      Allergies:   Patient has no known allergies.   Social History:  The patient  reports that he has been smoking Cigarettes.  He has been smoking about 0.00 packs per day for the past 50.00 years. He has never used smokeless tobacco. He reports that he drinks about 4.8 oz of alcohol per week . He reports that he does not use drugs.   Family History:   family history includes Aneurysm in his sister; Bladder Cancer in his brother; COPD in his father; Cervical cancer in his sister; Dementia in his father; Diabetes in his mother and sister; Emphysema in his father; Heart disease in his maternal grandfather, maternal grandmother, paternal grandfather, paternal grandmother, and son; Hypertension in his son and son; Renal Disease in his mother.    Review  of Systems: Review of Systems  Constitutional: Negative.   Respiratory: Negative.   Cardiovascular: Negative.   Gastrointestinal: Negative.   Musculoskeletal: Negative.   Neurological: Negative.   Psychiatric/Behavioral: Negative.   All other systems reviewed and are negative.    PHYSICAL EXAM: VS:  BP (!) 169/74 (BP Location: Left Arm, Patient Position: Sitting, Cuff Size: Normal)   Pulse (!) 59   Ht  (1.753 m)   Wt 209 lb 12 oz (95.1 kg)   BMI 30.97  kg/m  , BMI Body mass index is 30.97 kg/m. GEN: Well nourished, well developed, in no acute distress  HEENT: normal  Neck: no JVD, carotid bruits, or masses Cardiac: RRR; no murmurs, rubs, or gallops,no edema  Respiratory:  clear to auscultation bilaterally, normal work of breathing GI: soft, nontender, nondistended, + BS MS: no deformity or atrophy  Skin: warm and dry, no rash Neuro:  Strength and sensation are intact Psych: euthymic mood, full affect   Recent Labs: 04/17/2017: ALT 20; BUN 12; Creatinine, Ser 0.93; Potassium 4.1; Sodium 140    Lipid Panel Lab Results  Component Value Date   CHOL 158 04/17/2017   HDL 26 (L) 04/29/2015   LDLCALC 60 04/29/2015   TRIG 176 (H) 04/17/2017      Wt Readings from Last 3 Encounters:  07/23/17 209 lb 12 oz (95.1 kg)  05/18/17 210 lb 3.2 oz (95.3 kg)  05/04/17 211 lb 3.2 oz (95.8 kg)       ASSESSMENT AND PLAN:  Chest pain, unspecified type Atypical chest pain, suspect musculoskeletal or residual effects from his prior episode of shingles. Symptoms started after previous shingle outbreak Does not present with exertion, more when he is sitting  Essential hypertension, benign Blood pressure elevated on today's visit, likely situational. Recommended he monitor blood pressure at home and call our office if blood pressure continues to run high  Hypercholesteremia Cholesterol is at goal on the current lipid regimen. No changes to the medications were made.  Stage 1 mild COPD by GOLD classification (HCC) Prior history of smoking, stopped any years ago Suspect smoked 20 years total heavy  Tobacco abuse We have encouraged him to continue to work on weaning his cigarettes and smoking cessation. He will continue to work on this and does not want any assistance with chantix.   Abnormal EKG Likely lead placement contributing to prior reading noted with primary care This was not noted on today's visit CT scan chest reviewed from 2-3  years ago showing no coronary calcification, no calcified aortic athero, no calcified carotid atherosclerosis Nondiabetic, not smoking, cholesterol at goal No further testing needed  Disposition:   F/U  As needed   Total encounter time more than 60 minutes  Greater than 50% was spent in counseling and coordination of care with the patient   No orders of the defined types were placed in this encounter.    Signed, Dossie Arbour, M.D., Ph.D. 07/23/2017  [facility] [place], [place] [phone]

## 2017-07-23 ENCOUNTER — Ambulatory Visit (INDEPENDENT_AMBULATORY_CARE_PROVIDER_SITE_OTHER): Payer: Medicare Other | Admitting: Cardiovascular Disease

## 2017-07-23 ENCOUNTER — Encounter: Payer: Self-pay | Admitting: Cardiovascular Disease

## 2017-07-23 VITALS — BP 169/74 | HR 59 | Ht 69.0 in | Wt 209.8 lb

## 2017-07-23 DIAGNOSIS — I10 Essential (primary) hypertension: Secondary | ICD-10-CM | POA: Diagnosis not present

## 2017-07-23 DIAGNOSIS — E7800 Pure hypercholesterolemia, unspecified: Secondary | ICD-10-CM | POA: Diagnosis not present

## 2017-07-23 DIAGNOSIS — Z720 Tobacco use: Secondary | ICD-10-CM

## 2017-07-23 DIAGNOSIS — J449 Chronic obstructive pulmonary disease, unspecified: Secondary | ICD-10-CM

## 2017-07-23 DIAGNOSIS — R079 Chest pain, unspecified: Secondary | ICD-10-CM

## 2017-07-23 NOTE — Patient Instructions (Signed)
Medication Instructions:   No medication changes made  Please monitor blood pressure Call if >140 on a regular basis   Labwork:  No new labs needed  Testing/Procedures:  No further testing at this time   Follow-Up: It was a pleasure seeing you in the office today. Please call us if you have new issues that need to be addressed before your next appt.  [phone]  Your physician wants you to follow-up in:  As needed  If you need a refill on your cardiac medications before your next appointment, please call your pharmacy.

## 2017-08-21 ENCOUNTER — Ambulatory Visit (INDEPENDENT_AMBULATORY_CARE_PROVIDER_SITE_OTHER): Payer: Medicare Other | Admitting: Unknown Physician Specialty

## 2017-08-21 ENCOUNTER — Encounter: Payer: Self-pay | Admitting: Unknown Physician Specialty

## 2017-08-21 VITALS — BP 121/77 | HR 80 | Temp 98.5°F | Wt 210.8 lb

## 2017-08-21 DIAGNOSIS — I10 Essential (primary) hypertension: Secondary | ICD-10-CM

## 2017-08-21 DIAGNOSIS — Z720 Tobacco use: Secondary | ICD-10-CM

## 2017-08-21 DIAGNOSIS — J449 Chronic obstructive pulmonary disease, unspecified: Secondary | ICD-10-CM

## 2017-08-21 DIAGNOSIS — Z23 Encounter for immunization: Secondary | ICD-10-CM | POA: Diagnosis not present

## 2017-08-21 NOTE — Assessment & Plan Note (Signed)
Better on recheck.  Stable, continue present medications.

## 2017-08-21 NOTE — Progress Notes (Signed)
BP 121/77 (BP Location: Left Arm, Cuff Size: Normal)   Pulse 80   Temp 98.5 F (36.9 C) (Oral)   Wt 210 lb 12.8 oz (95.6 kg)   SpO2 96%   BMI 31.13 kg/m    Subjective:    Patient ID: William LionsJohnnie N Fini Sr., male    DOB: 02/12/1951, 66 y.o.   MRN: [id number]  HPI: William GoldsJohnnie N Borromeo Sr. is a 66 y.o. male  Chief Complaint  Patient presents with  . Hyperlipidemia  . Hypertension   Hypertension Using medications without difficulty Average home BPs BP was 121/77 on recheck.  Good numbers at home   No problems or lightheadedness No chest pain with exertion or shortness of breath No Edema  Hyperlipidemia Using medications without problems: No Muscle aches  Diet compliance:Exercise:Active lifestyle  COPD No inhaler use needed for quite some time  Reviewed not from cardiology. No further work-up planned with chest pain suspected musculoskeletal.     Relevant past medical, surgical, family and social history reviewed and updated as indicated. Interim medical history since our last visit reviewed. Allergies and medications reviewed and updated.  Review of Systems  Per HPI unless specifically indicated above     Objective:    BP 121/77 (BP Location: Left Arm, Cuff Size: Normal)   Pulse 80   Temp 98.5 F (36.9 C) (Oral)   Wt 210 lb 12.8 oz (95.6 kg)   SpO2 96%   BMI 31.13 kg/m   Wt Readings from Last 3 Encounters:  08/21/17 210 lb 12.8 oz (95.6 kg)  07/23/17 209 lb 12 oz (95.1 kg)  05/18/17 210 lb 3.2 oz (95.3 kg)    Physical Exam  Constitutional: He is oriented to person, place, and time. He appears well-developed and well-nourished. No distress.  HENT:  Head: Normocephalic and atraumatic.  Eyes: Conjunctivae and lids are normal. Right eye exhibits no discharge. Left eye exhibits no discharge. No scleral icterus.  Neck: Normal range of motion. Neck supple. No JVD present. Carotid bruit is not present.  Cardiovascular: Normal rate, regular rhythm and normal heart  sounds.  Pulmonary/Chest: Effort normal and breath sounds normal. No respiratory distress.  Abdominal: Normal appearance. There is no splenomegaly or hepatomegaly.  Musculoskeletal: Normal range of motion.  Neurological: He is alert and oriented to person, place, and time.  Skin: Skin is warm, dry and intact. No rash noted. No pallor.  Psychiatric: He has a normal mood and affect. His behavior is normal. Judgment and thought content normal.    Results for orders placed or performed in visit on 04/17/17  Lipid Panel Piccolo, Arrow ElectronicsWaived  Result Value Ref Range   Cholesterol Piccolo, Waived 158 <200 mg/dL   HDL Chol Piccolo, Waived 30 (L) >59 mg/dL   Triglycerides Piccolo,Waived 176 (H) <150 mg/dL   Chol/HDL Ratio Piccolo,Waive 5.2 (H) mg/dL   LDL Chol Calc Piccolo Waived 92 <100 mg/dL   VLDL Chol Calc Piccolo,Waive 35 (H) <30 mg/dL  Comprehensive metabolic panel  Result Value Ref Range   Glucose 99 65 - 99 mg/dL   BUN 12 8 - 27 mg/dL   Creatinine, Ser 4.090.93 0.76 - 1.27 mg/dL   GFR calc non Af Amer 85 >59 mL/min/1.73   GFR calc Af Amer 98 >59 mL/min/1.73   BUN/Creatinine Ratio 13 10 - 24   Sodium 140 134 - 144 mmol/L   Potassium 4.1 3.5 - 5.2 mmol/L   Chloride 104 96 - 106 mmol/L   CO2 19 (L) 20 - 29  mmol/L   Calcium 9.4 8.6 - 10.2 mg/dL   Total Protein 7.2 6.0 - 8.5 g/dL   Albumin 4.5 3.6 - 4.8 g/dL   Globulin, Total 2.7 1.5 - 4.5 g/dL   Albumin/Globulin Ratio 1.7 1.2 - 2.2   Bilirubin Total 0.6 0.0 - 1.2 mg/dL   Alkaline Phosphatase 86 39 - 117 IU/L   AST 33 0 - 40 IU/L   ALT 20 0 - 44 IU/L  Hepatitis C antibody  Result Value Ref Range   Hep C Virus Ab <0.1 0.0 - 0.9 s/co ratio  Uric acid  Result Value Ref Range   Uric Acid 7.5 3.7 - 8.6 mg/dL      Assessment & Plan:   Problem List Items Addressed This Visit      Unprioritized   Essential hypertension, benign    Better on recheck.  Stable, continue present medications.        Stage 1 mild COPD by GOLD classification  (HCC)    Stable, continue present medications.        Tobacco abuse     I have recommended absolute tobacco cessation. I have discussed various options available for assistance with tobacco cessation including over the counter methods (Nicotine gum, patch and lozenges). We also discussed prescription options (Chantix, Nicotine Inhaler / Nasal Spray). The patient is not interested in pursuing any prescription tobacco cessation options at this time.        Other Visit Diagnoses    Need for influenza vaccination    -  Primary   Relevant Orders   Flu vaccine HIGH DOSE PF (Completed)       Follow up plan: Return in about 6 months (around 02/18/2018).

## 2017-08-21 NOTE — Patient Instructions (Addendum)

## 2017-08-21 NOTE — Assessment & Plan Note (Signed)
Stable, continue present medications.   

## 2017-08-21 NOTE — Assessment & Plan Note (Signed)
I have recommended absolute tobacco cessation. I have discussed various options available for assistance with tobacco cessation including over the counter methods (Nicotine gum, patch and lozenges). We also discussed prescription options (Chantix, Nicotine Inhaler / Nasal Spray). The patient is not interested in pursuing any prescription tobacco cessation options at this time.  

## 2017-09-24 ENCOUNTER — Other Ambulatory Visit: Payer: Self-pay | Admitting: Unknown Physician Specialty

## 2018-02-17 NOTE — Progress Notes (Signed)
Cardiology Office Note  Date:  02/18/2018   ID:  William Lions Sr., DOB 01-15-1951, MRN [id number]  PCP:  Gabriel Cirri, NP   Chief Complaint  Patient presents with  . other    C/o irregular heart beat "feels like a vibrator'. Meds reviewed verbally with pt.    HPI:  William Murray is a 67 year old gentleman with past medical history of Hypertension Ectopy, for years Quit smoking, occasional Chronic shortness of breath, COPD Who presents for follow-up of his abnormal EKG, chest pain Seen October 2018  CT scan several years ago with no coronary calcification, no aortic atherosclerosis and no carotid atherosclerosis  In follow-up today reports having some rare Thumping in his chest will last for several seconds at a time Typically presents at rest Does not know if it is extra beats On priorClinic visit reported having palpitations from extra beats, strong beat followed by a pause Just wanted to make sure everything was okay  Rare tight feeling for 3 seconds at rest,  Retired [employer], clean house, No SOB or chest pain symptoms with exertion able to walk a long distance with no symptoms  Rare chest pain,  Had shingles in same area  On last clinic visit,Wife reports he is sedentary, she works and he will sit around most of the day  EKG personally reviewed by myself on todays visit Shows normal sinus rhythm rate 60 bpm no significant ST or T-wave changes  Total chol 158 Stress test 2016, no ischemia, for chest pain  Prior cardiac cath, done at [facility], results not available LE arterial Doppler 03/21/2012: normal  CT scan chest July 2015 reviewed with him showing no significant coronary calcifications, no aortic atherosclerosis, no carotid plaque   PMH:   has a past medical history of Anginal pain (HCC), Arthritis, COPD (chronic obstructive pulmonary disease) (HCC), Erectile disorder due to medical condition in male, Gout, Hyperlipidemia, Hypertension, and Shortness of breath  dyspnea.  PSH:    Past Surgical History:  Procedure Laterality Date  . arm surgery Left   . CARDIAC CATHETERIZATION    . TONSILLECTOMY      Current Outpatient Medications  Medication Sig Dispense Refill  . albuterol (PROVENTIL HFA;VENTOLIN HFA) 108 (90 Base) MCG/ACT inhaler Inhale 2 puffs into the lungs every 6 (six) hours as needed for wheezing or shortness of breath.    Marland Kitchen aspirin 81 MG tablet Take 1 tablet (81 mg total) by mouth daily. 30 tablet 12  . atorvastatin (LIPITOR) 10 MG tablet TAKE 1 TABLET BY MOUTH IN THE EVENING 90 tablet 1  . losartan-hydrochlorothiazide (HYZAAR) 50-12.5 MG tablet TAKE 1 TABLET BY MOUTH DAILY 90 tablet 1   No current facility-administered medications for this visit.      Allergies:   Patient has no known allergies.   Social History:  The patient  reports that he has been smoking cigarettes.  He has been smoking about 0.00 packs per day for the past 50.00 years. He has never used smokeless tobacco. He reports that he drinks about 4.8 oz of alcohol per week. He reports that he does not use drugs.   Family History:   family history includes Aneurysm in his sister; Bladder Cancer in his brother; COPD in his father; Cervical cancer in his sister; Dementia in his father; Diabetes in his mother and sister; Emphysema in his father; Heart disease in his maternal grandfather, maternal grandmother, paternal grandfather, paternal grandmother, and son; Hypertension in his son and son; Renal Disease in his mother.  Review of Systems: Review of Systems  Constitutional: Negative.   Respiratory: Negative.   Cardiovascular: Negative.   Gastrointestinal: Negative.   Musculoskeletal: Negative.   Neurological: Negative.   Psychiatric/Behavioral: Negative.   All other systems reviewed and are negative.    PHYSICAL EXAM: VS:  BP (!) 158/78 (BP Location: Left Arm, Patient Position: Sitting, Cuff Size: Normal)   Pulse 60   Ht  (1.753 m)   Wt 211 lb 8 oz  (95.9 kg)   BMI 31.23 kg/m  , BMI Body mass index is 31.23 kg/m. Constitutional:  oriented to person, place, and time. No distress.  HENT:  Head: Normocephalic and atraumatic.  Eyes:  no discharge. No scleral icterus.  Neck: Normal range of motion. Neck supple. No JVD present.  Cardiovascular: Normal rate, regular rhythm, normal heart sounds and intact distal pulses. Exam reveals no gallop and no friction rub. No edema No murmur heard. Pulmonary/Chest: Effort normal and breath sounds normal. No stridor. No respiratory distress.  no wheezes.  no rales.  no tenderness.  Abdominal: Soft.  no distension.  no tenderness.  Musculoskeletal: Normal range of motion.  no  tenderness or deformity.  Neurological:  normal muscle tone. Coordination normal. No atrophy Skin: Skin is warm and dry. No rash noted. not diaphoretic.  Psychiatric:  normal mood and affect. behavior is normal. Thought content normal.     Recent Labs: 04/17/2017: ALT 20; BUN 12; Creatinine, Ser 0.93; Potassium 4.1; Sodium 140    Lipid Panel Lab Results  Component Value Date   CHOL 158 04/17/2017   HDL 26 (L) 04/29/2015   LDLCALC 60 04/29/2015   TRIG 176 (H) 04/17/2017      Wt Readings from Last 3 Encounters:  02/18/18 211 lb 8 oz (95.9 kg)  08/21/17 210 lb 12.8 oz (95.6 kg)  07/23/17 209 lb 12 oz (95.1 kg)       ASSESSMENT AND PLAN:  Chest pain, unspecified type Atypical chest pain,Sometimes at rest Prior CT scan no coronary calcifications No further workup at this time, reassurance provided  Essential hypertension, benign Blood pressure mildly elevated, recommended he monitor blood pressure at home and call us if this continues to run high  Hypercholesteremia Cholesterol is at goal on the current lipid regimen. No changes to the medications were made.  Stage 1 mild COPD by GOLD classification (HCC) Prior history of smoking, stopped any years ago Suspect smoked 20 years total heavy Denies shortness of  breath  Tobacco abuse Smoking cessation recommended  Heart palpitations Occasional thumping in his chest atypical PVCs previously noted on EKG Not but typically bothered by it  Disposition:   F/U  As needed   Total encounter time more than 25 minutes  Greater than 50% was spent in counseling and coordination of care with the patient    Orders Placed This Encounter  Procedures  . EKG 12-Lead     Signed, Dossie Arbour, M.D., Ph.D. 02/18/2018  [facility] [place], [place] [phone]

## 2018-02-18 ENCOUNTER — Ambulatory Visit: Payer: Medicare HMO | Admitting: Cardiovascular Disease

## 2018-02-18 ENCOUNTER — Encounter: Payer: Self-pay | Admitting: Cardiovascular Disease

## 2018-02-18 ENCOUNTER — Encounter

## 2018-02-18 VITALS — BP 158/78 | HR 60 | Ht 69.0 in | Wt 211.5 lb

## 2018-02-18 DIAGNOSIS — R079 Chest pain, unspecified: Secondary | ICD-10-CM | POA: Diagnosis not present

## 2018-02-18 DIAGNOSIS — I10 Essential (primary) hypertension: Secondary | ICD-10-CM

## 2018-02-18 DIAGNOSIS — J449 Chronic obstructive pulmonary disease, unspecified: Secondary | ICD-10-CM | POA: Diagnosis not present

## 2018-02-18 DIAGNOSIS — Z720 Tobacco use: Secondary | ICD-10-CM

## 2018-02-18 DIAGNOSIS — E7800 Pure hypercholesterolemia, unspecified: Secondary | ICD-10-CM

## 2018-02-18 DIAGNOSIS — R002 Palpitations: Secondary | ICD-10-CM | POA: Insufficient documentation

## 2018-02-18 NOTE — Patient Instructions (Signed)

## 2018-02-20 ENCOUNTER — Ambulatory Visit (INDEPENDENT_AMBULATORY_CARE_PROVIDER_SITE_OTHER): Payer: Medicare HMO | Admitting: Unknown Physician Specialty

## 2018-02-20 ENCOUNTER — Encounter: Payer: Self-pay | Admitting: Unknown Physician Specialty

## 2018-02-20 VITALS — BP 144/76 | HR 62 | Wt 212.0 lb

## 2018-02-20 DIAGNOSIS — I10 Essential (primary) hypertension: Secondary | ICD-10-CM | POA: Diagnosis not present

## 2018-02-20 DIAGNOSIS — E7800 Pure hypercholesterolemia, unspecified: Secondary | ICD-10-CM

## 2018-02-20 DIAGNOSIS — Z720 Tobacco use: Secondary | ICD-10-CM

## 2018-02-20 DIAGNOSIS — R002 Palpitations: Secondary | ICD-10-CM | POA: Diagnosis not present

## 2018-02-20 MED ORDER — LOSARTAN POTASSIUM-HCTZ 50-12.5 MG PO TABS: 1.0000 | ORAL_TABLET | Freq: Every day | ORAL | 1 refills | Status: DC

## 2018-02-20 MED ORDER — ATORVASTATIN CALCIUM 10 MG PO TABS: 10.0000 mg | ORAL_TABLET | Freq: Every evening | ORAL | 1 refills | Status: DC

## 2018-02-20 NOTE — Assessment & Plan Note (Addendum)
New problem.  Check TSH and CBC.  Dr. Windell Hummingbird note reviewed

## 2018-02-20 NOTE — Progress Notes (Signed)
BP (!) 144/76   Pulse 62   Wt 212 lb (96.2 kg)   SpO2 97%   BMI 31.31 kg/m    Subjective:    Patient ID: William Lions Sr., male    DOB: 1951/07/13, 67 y.o.   MRN: [id number]  HPI: William Murray. is a 67 y.o. male  Chief Complaint  Patient presents with  . Follow-up    Pt saw cardiology on Monday for "flutter"  . Hypertension   Palpitations Saw Dr. Mariah Milling Note reviewed.  No recommended f/u due to benign causes.    Hypertension Using medications without difficulty Average home BPs not checking   No problems or lightheadedness No chest pain with exertion or shortness of breath No Edema  Hyperlipidemia Using medications without problems: No Muscle aches  Diet compliance:Exercise:"getting back into it."  Gained some weight recently  COPD Admits to smoking "once in a while"  Quit a smoking a lot about 3 years ago.  No need for inhaler use for a while  Social History   Socioeconomic History  . Marital status: Married    Spouse name: Not on file  . Number of children: Not on file  . Years of education: Not on file  . Highest education level: Not on file  Occupational History  . Not on file  Social Needs  . Financial resource strain: Not on file  . Food insecurity:    Worry: Not on file    Inability: Not on file  . Transportation needs:    Medical: Not on file    Non-medical: Not on file  Tobacco Use  . Smoking status: Current Some Day Smoker    Packs/day: 0.00    Years: 50.00    Pack years: 0.00    Types: Cigarettes    Last attempt to quit: 10/09/2014    Years since quitting: 3.3  . Smokeless tobacco: Never Used  . Tobacco comment: Smokes occasioanlly   Substance and Sexual Activity  . Alcohol use: Yes    Alcohol/week: 4.8 oz    Types: 2 Cans of beer, 6 Shots of liquor per week    Comment: 6 shots of liquor several times a week  . Drug use: No  . Sexual activity: Not Currently  Lifestyle  . Physical activity:    Days per week: Not on file   Minutes per session: Not on file  . Stress: Not on file  Relationships  . Social connections:    Talks on phone: Not on file    Gets together: Not on file    Attends religious service: Not on file    Active member of club or organization: Not on file    Attends meetings of clubs or organizations: Not on file    Relationship status: Not on file  . Intimate partner violence:    Fear of current or ex partner: Not on file    Emotionally abused: Not on file    Physically abused: Not on file    Forced sexual activity: Not on file  Other Topics Concern  . Not on file  Social History Narrative  . Not on file    Relevant past medical, surgical, family and social history reviewed and updated as indicated. Interim medical history since our last visit reviewed. Allergies and medications reviewed and updated.  Review of Systems  Per HPI unless specifically indicated above     Objective:    BP (!) 144/76   Pulse 62  Wt 212 lb (96.2 kg)   SpO2 97%   BMI 31.31 kg/m   Wt Readings from Last 3 Encounters:  02/20/18 212 lb (96.2 kg)  02/18/18 211 lb 8 oz (95.9 kg)  08/21/17 210 lb 12.8 oz (95.6 kg)    Physical Exam  Constitutional: He is oriented to person, place, and time. He appears well-developed and well-nourished. No distress.  HENT:  Head: Normocephalic and atraumatic.  Eyes: Conjunctivae and lids are normal. Right eye exhibits no discharge. Left eye exhibits no discharge. No scleral icterus.  Neck: Normal range of motion. Neck supple. No JVD present. Carotid bruit is not present.  Cardiovascular: Normal rate, regular rhythm and normal heart sounds.  Pulmonary/Chest: Effort normal and breath sounds normal. No respiratory distress.  Abdominal: Normal appearance. There is no splenomegaly or hepatomegaly.  Musculoskeletal: Normal range of motion.  Neurological: He is alert and oriented to person, place, and time.  Skin: Skin is warm, dry and intact. No rash noted. No pallor.    Psychiatric: He has a normal mood and affect. His behavior is normal. Judgment and thought content normal.    Results for orders placed or performed in visit on 04/17/17  Lipid Panel Piccolo, [employer]  Result Value Ref Range   Cholesterol Piccolo, Waived 158 <200 mg/dL   HDL Chol Piccolo, Waived 30 (L) >59 mg/dL   Triglycerides Piccolo,Waived 176 (H) <150 mg/dL   Chol/HDL Ratio Piccolo,Waive 5.2 (H) mg/dL   LDL Chol Calc Piccolo Waived 92 <100 mg/dL   VLDL Chol Calc Piccolo,Waive 35 (H) <30 mg/dL  Comprehensive metabolic panel  Result Value Ref Range   Glucose 99 65 - 99 mg/dL   BUN 12 8 - 27 mg/dL   Creatinine, Ser 1.61 0.76 - 1.27 mg/dL   GFR calc non Af Amer 85 >59 mL/min/1.73   GFR calc Af Amer 98 >59 mL/min/1.73   BUN/Creatinine Ratio 13 10 - 24   Sodium 140 134 - 144 mmol/L   Potassium 4.1 3.5 - 5.2 mmol/L   Chloride 104 96 - 106 mmol/L   CO2 19 (L) 20 - 29 mmol/L   Calcium 9.4 8.6 - 10.2 mg/dL   Total Protein 7.2 6.0 - 8.5 g/dL   Albumin 4.5 3.6 - 4.8 g/dL   Globulin, Total 2.7 1.5 - 4.5 g/dL   Albumin/Globulin Ratio 1.7 1.2 - 2.2   Bilirubin Total 0.6 0.0 - 1.2 mg/dL   Alkaline Phosphatase 86 39 - 117 IU/L   AST 33 0 - 40 IU/L   ALT 20 0 - 44 IU/L  Hepatitis C antibody  Result Value Ref Range   Hep C Virus Ab <0.1 0.0 - 0.9 s/co ratio  Uric acid  Result Value Ref Range   Uric Acid 7.5 3.7 - 8.6 mg/dL      Assessment & Plan:   Problem List Items Addressed This Visit      Unprioritized   Essential hypertension, benign - Primary    Pt states it's a little higher in the office.  Encouraed to check at home.  Reminded goal BP is below 130/90      Relevant Medications   losartan-hydrochlorothiazide (HYZAAR) 50-12.5 MG tablet   atorvastatin (LIPITOR) 10 MG tablet   Other Relevant Orders   Comprehensive metabolic panel   Hypercholesteremia   Relevant Medications   losartan-hydrochlorothiazide (HYZAAR) 50-12.5 MG tablet   atorvastatin (LIPITOR) 10 MG tablet    Other Relevant Orders   Lipid Panel w/o Chol/HDL Ratio   Palpitations  New problem.  Check TSH and CBC.  Dr. Windell Hummingbird note reviewed      Relevant Orders   TSH   CBC with Differential/Platelet   Tobacco abuse    Schedule low dose CT screen          Follow up plan: Return in about 6 months (around 08/23/2018) for physical.

## 2018-02-20 NOTE — Assessment & Plan Note (Signed)
Schedule low dose CT screen

## 2018-02-20 NOTE — Assessment & Plan Note (Signed)
Pt states it's a little higher in the office.  Encouraed to check at home.  Reminded goal BP is below 130/90

## 2018-02-21 ENCOUNTER — Encounter: Payer: Self-pay | Admitting: Unknown Physician Specialty

## 2018-02-21 ENCOUNTER — Telehealth: Payer: Self-pay | Admitting: *Deleted

## 2018-02-21 LAB — COMPREHENSIVE METABOLIC PANEL
ALT: 17 IU/L (ref 0–44)
AST: 29 IU/L (ref 0–40)
Albumin/Globulin Ratio: 1.4 (ref 1.2–2.2)
Albumin: 4.3 g/dL (ref 3.6–4.8)
Alkaline Phosphatase: 91 IU/L (ref 39–117)
BUN/Creatinine Ratio: 14 (ref 10–24)
BUN: 12 mg/dL (ref 8–27)
Bilirubin Total: 0.8 mg/dL (ref 0.0–1.2)
CO2: 21 mmol/L (ref 20–29)
Calcium: 9.5 mg/dL (ref 8.6–10.2)
Chloride: 102 mmol/L (ref 96–106)
Creatinine, Ser: 0.88 mg/dL (ref 0.76–1.27)
GFR calc Af Amer: 102 mL/min/{1.73_m2} (ref >59)
GFR calc non Af Amer: 88 mL/min/{1.73_m2} (ref >59)
Globulin, Total: 3.1 g/dL (ref 1.5–4.5)
Glucose: 74 mg/dL (ref 65–99)
Potassium: 4.2 mmol/L (ref 3.5–5.2)
Sodium: 145 mmol/L — ABNORMAL HIGH (ref 134–144)
Total Protein: 7.4 g/dL (ref 6.0–8.5)

## 2018-02-21 LAB — CBC WITH DIFFERENTIAL/PLATELET
Basophils Absolute: 0 10*3/uL (ref 0.0–0.2)
Basos: 0 %
EOS (ABSOLUTE): 0.2 10*3/uL (ref 0.0–0.4)
Eos: 2 %
Hematocrit: 45.9 % (ref 37.5–51.0)
Hemoglobin: 16.3 g/dL (ref 13.0–17.7)
Immature Grans (Abs): 0 10*3/uL (ref 0.0–0.1)
Immature Granulocytes: 0 %
Lymphocytes Absolute: 3 10*3/uL (ref 0.7–3.1)
Lymphs: 37 %
MCH: 33 pg (ref 26.6–33.0)
MCHC: 35.5 g/dL (ref 31.5–35.7)
MCV: 93 fL (ref 79–97)
Monocytes Absolute: 0.7 10*3/uL (ref 0.1–0.9)
Monocytes: 9 %
Neutrophils Absolute: 4.1 10*3/uL (ref 1.4–7.0)
Neutrophils: 52 %
Platelets: 169 10*3/uL (ref 150–379)
RBC: 4.94 x10E6/uL (ref 4.14–5.80)
RDW: 14 % (ref 12.3–15.4)
WBC: 7.9 10*3/uL (ref 3.4–10.8)

## 2018-02-21 LAB — LIPID PANEL W/O CHOL/HDL RATIO
Cholesterol, Total: 130 mg/dL (ref 100–199)
HDL: 34 mg/dL — ABNORMAL LOW (ref >39)
LDL Calculated: 47 mg/dL (ref 0–99)
Triglycerides: 244 mg/dL — ABNORMAL HIGH (ref 0–149)
VLDL Cholesterol Cal: 49 mg/dL — ABNORMAL HIGH (ref 5–40)

## 2018-02-21 LAB — TSH: TSH: 3.44 u[IU]/mL (ref 0.450–4.500)

## 2018-02-21 NOTE — Telephone Encounter (Signed)
Received referral for low dose lung cancer screening CT scan. Message left at phone number listed in EMR for patient to call me back to facilitate scheduling scan.  

## 2018-02-27 ENCOUNTER — Telehealth: Payer: Self-pay | Admitting: *Deleted

## 2018-02-27 DIAGNOSIS — Z122 Encounter for screening for malignant neoplasm of respiratory organs: Secondary | ICD-10-CM

## 2018-02-27 DIAGNOSIS — Z87891 Personal history of nicotine dependence: Secondary | ICD-10-CM

## 2018-02-27 NOTE — Telephone Encounter (Signed)
Received referral for initial lung cancer screening scan. Contacted patient and obtained smoking history,(current, 124 pack year) as well as answering questions related to screening process. Patient denies signs of lung cancer such as weight loss or hemoptysis. Patient denies comorbidity that would prevent curative treatment if lung cancer were found. Patient is scheduled for shared decision making visit and CT scan on 03/19/18.

## 2018-03-19 ENCOUNTER — Encounter: Payer: Self-pay | Admitting: Oncology

## 2018-03-19 ENCOUNTER — Ambulatory Visit
Admission: RE | Admit: 2018-03-19 | Discharge: 2018-03-19 | Disposition: A | Discharge status: Home / Self Care | Payer: Medicare HMO | Source: Ambulatory Visit | Attending: Nurse Practitioner | Admitting: Nurse Practitioner

## 2018-03-19 ENCOUNTER — Inpatient Hospital Stay: Payer: Medicare HMO | Attending: Oncology | Admitting: Oncology

## 2018-03-19 DIAGNOSIS — Z87891 Personal history of nicotine dependence: Secondary | ICD-10-CM | POA: Diagnosis not present

## 2018-03-19 DIAGNOSIS — I700 Atherosclerosis of aorta: Secondary | ICD-10-CM | POA: Diagnosis not present

## 2018-03-19 DIAGNOSIS — Z122 Encounter for screening for malignant neoplasm of respiratory organs: Secondary | ICD-10-CM | POA: Diagnosis not present

## 2018-03-19 NOTE — Progress Notes (Signed)
In accordance with CMS guidelines, patient has met eligibility criteria including age, absence of signs or symptoms of lung cancer.  Social History   Tobacco Use  . Smoking status: Current Some Day Smoker    Packs/day: 2.00    Years: 62.00    Pack years: 124.00    Types: Cigarettes    Last attempt to quit: 10/09/2014    Years since quitting: 3.4  . Smokeless tobacco: Never Used  . Tobacco comment: .25 ppd currently  Substance Use Topics  . Alcohol use: Yes    Alcohol/week: 4.8 oz    Types: 2 Cans of beer, 6 Shots of liquor per week    Comment: 6 shots of liquor several times a week  . Drug use: No     A shared decision-making session was conducted prior to the performance of CT scan. This includes one or more decision aids, includes benefits and harms of screening, follow-up diagnostic testing, over-diagnosis, false positive rate, and total radiation exposure.  Counseling on the importance of adherence to annual lung cancer LDCT screening, impact of co-morbidities, and ability or willingness to undergo diagnosis and treatment is imperative for compliance of the program.  Counseling on the importance of continued smoking cessation for former smokers; the importance of smoking cessation for current smokers, and information about tobacco cessation interventions have been given to patient including [facility] and [employer] quit [place] programs.  Written order for lung cancer screening with LDCT has been given to the patient and any and all questions have been answered to the best of my abilities.   Yearly follow up will be coordinated by Burgess Estelle, Thoracic Navigator.  Faythe Casa, NP 03/19/2018 11:09 AM

## 2018-03-25 ENCOUNTER — Telehealth: Payer: Self-pay | Admitting: *Deleted

## 2018-03-25 NOTE — Telephone Encounter (Signed)
Notified patient of LDCT lung cancer screening program results with recommendation for 12 month follow up imaging. Also notified of incidental findings noted below and is encouraged to discuss further with PCP who will receive a copy of this note and/or the CT report. Patient verbalizes understanding.   IMPRESSION: Lung-RADS 2, benign appearance or behavior. Continue annual screening with low-dose chest CT without contrast in 12 months.  Aortic Atherosclerosis (ICD10-I70.0). 

## 2018-04-14 ENCOUNTER — Other Ambulatory Visit: Payer: Self-pay | Admitting: Unknown Physician Specialty

## 2018-05-03 ENCOUNTER — Ambulatory Visit: Payer: Self-pay

## 2018-05-08 ENCOUNTER — Ambulatory Visit (INDEPENDENT_AMBULATORY_CARE_PROVIDER_SITE_OTHER): Payer: Medicare HMO

## 2018-05-08 VITALS — BP 138/68 | HR 64 | Temp 98.7°F | Resp 17 | Ht 69.0 in | Wt 206.1 lb

## 2018-05-08 DIAGNOSIS — Z0000 Encounter for general adult medical examination without abnormal findings: Secondary | ICD-10-CM

## 2018-05-08 DIAGNOSIS — Z1211 Encounter for screening for malignant neoplasm of colon: Secondary | ICD-10-CM

## 2018-05-08 NOTE — Patient Instructions (Addendum)
[name] , Thank you for taking time to come for your Medicare Wellness Visit. I appreciate your ongoing commitment to your health goals. Please review the following plan we discussed and let me know if I can assist you in the future.   Screening recommendations/referrals: Colonoscopy: due now - cologuard order  Recommended yearly ophthalmology/optometry visit for glaucoma screening and checkup Recommended yearly dental visit for hygiene and checkup  Vaccinations: Influenza vaccine: due 06/2018  Pneumococcal vaccine: completed series Tdap vaccine: up to date Shingles vaccine: shingrix eligible, check with your insurance company for coverage   Advanced directives: Advance directive discussed with you today. I have provided a copy for you to complete at home and have notarized. Once this is complete please bring a copy in to our office so we can scan it into your chart.  Conditions/risks identified: Recommend drinking at least 6-8 glasses of water a day   Next appointment: Follow up in one year for your annual wellness exam.   Preventive Care 65 Years and Older, Male Preventive care refers to lifestyle choices and visits with your health care provider that can promote health and wellness. What does preventive care include?  A yearly physical exam. This is also called an annual well check.  Dental exams once or twice a year.  Routine eye exams. Ask your health care provider how often you should have your eyes checked.  Personal lifestyle choices, including:  Daily care of your teeth and gums.  Regular physical activity.  Eating a healthy diet.  Avoiding tobacco and drug use.  Limiting alcohol use.  Practicing safe sex.  Taking low doses of aspirin every day.  Taking vitamin and mineral supplements as recommended by your health care provider. What happens during an annual well check? The services and screenings done by your health care provider during your annual well  check will depend on your age, overall health, lifestyle risk factors, and family history of disease. Counseling  Your health care provider may ask you questions about your:  Alcohol use.  Tobacco use.  Drug use.  Emotional well-being.  Home and relationship well-being.  Sexual activity.  Eating habits.  History of falls.  Memory and ability to understand (cognition).  Work and work Astronomer. Screening  You may have the following tests or measurements:  Height, weight, and BMI.  Blood pressure.  Lipid and cholesterol levels. These may be checked every 5 years, or more frequently if you are over 14 years old.  Skin check.  Lung cancer screening. You may have this screening every year starting at age 19 if you have a 30-pack-year history of smoking and currently smoke or have quit within the past 15 years.  Fecal occult blood test (FOBT) of the stool. You may have this test every year starting at age 12.  Flexible sigmoidoscopy or colonoscopy. You may have a sigmoidoscopy every 5 years or a colonoscopy every 10 years starting at age 25.  Prostate cancer screening. Recommendations will vary depending on your family history and other risks.  Hepatitis C blood test.  Hepatitis B blood test.  Sexually transmitted disease (STD) testing.  Diabetes screening. This is done by checking your blood sugar (glucose) after you have not eaten for a while (fasting). You may have this done every 1-3 years.  Abdominal aortic aneurysm (AAA) screening. You may need this if you are a current or former smoker.  Osteoporosis. You may be screened starting at age 78 if you are at high risk.  Talk with your health care provider about your test results, treatment options, and if necessary, the need for more tests. Vaccines  Your health care provider may recommend certain vaccines, such as:  Influenza vaccine. This is recommended every year.  Tetanus, diphtheria, and acellular  pertussis (Tdap, Td) vaccine. You may need a Td booster every 10 years.  Zoster vaccine. You may need this after age 55.  Pneumococcal 13-valent conjugate (PCV13) vaccine. One dose is recommended after age 50.  Pneumococcal polysaccharide (PPSV23) vaccine. One dose is recommended after age 13. Talk to your health care provider about which screenings and vaccines you need and how often you need them. This information is not intended to replace advice given to you by your health care provider. Make sure you discuss any questions you have with your health care provider. Document Released: 10/22/2015 Document Revised: 06/14/2016 Document Reviewed: 07/27/2015 Elsevier Interactive Patient Education  2017 [employer] Prevention in the Home Falls can cause injuries. They can happen to people of all ages. There are many things you can do to make your home safe and to help prevent falls. What can I do on the outside of my home?  Regularly fix the edges of walkways and driveways and fix any cracks.  Remove anything that might make you trip as you walk through a door, such as a raised step or threshold.  Trim any bushes or trees on the path to your home.  Use bright outdoor lighting.  Clear any walking paths of anything that might make someone trip, such as rocks or tools.  Regularly check to see if handrails are loose or broken. Make sure that both sides of any steps have handrails.  Any raised decks and porches should have guardrails on the edges.  Have any leaves, snow, or ice cleared regularly.  Use sand or salt on walking paths during winter.  Clean up any spills in your garage right away. This includes oil or grease spills. What can I do in the bathroom?  Use night lights.  Install grab bars by the toilet and in the tub and shower. Do not use towel bars as grab bars.  Use non-skid mats or decals in the tub or shower.  If you need to sit down in the shower, use a plastic,  non-slip stool.  Keep the floor dry. Clean up any water that spills on the floor as soon as it happens.  Remove soap buildup in the tub or shower regularly.  Attach bath mats securely with double-sided non-slip rug tape.  Do not have throw rugs and other things on the floor that can make you trip. What can I do in the bedroom?  Use night lights.  Make sure that you have a light by your bed that is easy to reach.  Do not use any sheets or blankets that are too big for your bed. They should not hang down onto the floor.  Have a firm chair that has side arms. You can use this for support while you get dressed.  Do not have throw rugs and other things on the floor that can make you trip. What can I do in the kitchen?  Clean up any spills right away.  Avoid walking on wet floors.  Keep items that you use a lot in easy-to-reach places.  If you need to reach something above you, use a strong step stool that has a grab bar.  Keep electrical cords out of the way.  Do not use floor polish or wax that makes floors slippery. If you must use wax, use non-skid floor wax.  Do not have throw rugs and other things on the floor that can make you trip. What can I do with my stairs?  Do not leave any items on the stairs.  Make sure that there are handrails on both sides of the stairs and use them. Fix handrails that are broken or loose. Make sure that handrails are as long as the stairways.  Check any carpeting to make sure that it is firmly attached to the stairs. Fix any carpet that is loose or worn.  Avoid having throw rugs at the top or bottom of the stairs. If you do have throw rugs, attach them to the floor with carpet tape.  Make sure that you have a light switch at the top of the stairs and the bottom of the stairs. If you do not have them, ask someone to add them for you. What else can I do to help prevent falls?  Wear shoes that:  Do not have high heels.  Have rubber  bottoms.  Are comfortable and fit you well.  Are closed at the toe. Do not wear sandals.  If you use a stepladder:  Make sure that it is fully opened. Do not climb a closed stepladder.  Make sure that both sides of the stepladder are locked into place.  Ask someone to hold it for you, if possible.  Clearly mark and make sure that you can see:  Any grab bars or handrails.  First and last steps.  Where the edge of each step is.  Use tools that help you move around (mobility aids) if they are needed. These include:  Canes.  Walkers.  Scooters.  Crutches.  Turn on the lights when you go into a dark area. Replace any light bulbs as soon as they burn out.  Set up your furniture so you have a clear path. Avoid moving your furniture around.  If any of your floors are uneven, fix them.  If there are any pets around you, be aware of where they are.  Review your medicines with your doctor. Some medicines can make you feel dizzy. This can increase your chance of falling. Ask your doctor what other things that you can do to help prevent falls. This information is not intended to replace advice given to you by your health care provider. Make sure you discuss any questions you have with your health care provider. Document Released: 07/22/2009 Document Revised: 03/02/2016 Document Reviewed: 10/30/2014 Elsevier Interactive Patient Education  2017 [employer]   Free hearing clinics offered in [place]:   [facility] [address], [place], [place] [address] [phone]  [facility] [address], [place], [place] [address] [phone]

## 2018-05-08 NOTE — Progress Notes (Signed)
Subjective:   William Murray is a 67 y.o. male who presents for Medicare Annual/Subsequent preventive examination.  Review of Systems:     Cardiac Risk Factors include: advanced age (>26men, >65 women);dyslipidemia;male gender;hypertension;smoking/ tobacco exposure;obesity (BMI >30kg/m2)     Objective:    Vitals: BP 138/68 (BP Location: Left Arm, Patient Position: Sitting)   Pulse 64   Temp 98.7 F (37.1 C) (Temporal)   Resp 17   Ht 5\' 9"  (1.753 m)   Wt 206 lb 1.6 oz (93.5 kg)   BMI 30.44 kg/m   Body mass index is 30.44 kg/m.  Advanced Directives 05/08/2018 05/04/2017 01/16/2017 04/28/2015 04/28/2015  Does Patient Have a Medical Advance Directive? No No No No No  Would patient like information on creating a medical advance directive? Yes (MAU/Ambulatory/Procedural Areas - Information given) Yes (MAU/Ambulatory/Procedural Areas - Information given) - Yes - Educational materials given Yes - Transport planner given    Tobacco Social History   Tobacco Use  Smoking Status Former Smoker  . Packs/day: 0.25  . Years: 62.00  . Pack years: 15.50  . Types: Cigarettes  . Last attempt to quit: 01/07/2018  . Years since quitting: 0.3  Smokeless Tobacco Never Used     Counseling given: Yes   Clinical Intake:  Pre-visit preparation completed: Yes  Pain : No/denies pain     Nutritional Status: BMI > 30  Obese Nutritional Risks: None Diabetes: No  How often do you need to have someone help you when you read instructions, pamphlets, or other written materials from your doctor or pharmacy?: 1 - Never What is the last grade level you completed in school?: 9th grade  Interpreter Needed?: No  Information entered by :: Renessa Wellnitz,LPN   Past Medical History:  Diagnosis Date  . Anginal pain (HCC)   . Arthritis   . COPD (chronic obstructive pulmonary disease) (HCC)   . Erectile disorder due to medical condition in male   . Gout   . Hyperlipidemia   . Hypertension   .  Shortness of breath dyspnea    Past Surgical History:  Procedure Laterality Date  . arm surgery Left   . CARDIAC CATHETERIZATION    . TONSILLECTOMY     Family History  Problem Relation Age of Onset  . Renal Disease Mother   . Diabetes Mother   . Dementia Father   . COPD Father   . Emphysema Father   . Cervical cancer Sister   . Diabetes Sister   . Bladder Cancer Brother   . Hypertension Son   . Heart disease Son   . Heart disease Maternal Grandmother   . Heart disease Maternal Grandfather   . Heart disease Paternal Grandmother   . Heart disease Paternal Grandfather   . Aneurysm Sister   . Hypertension Son    Social History   Socioeconomic History  . Marital status: Married    Spouse name: Not on file  . Number of children: Not on file  . Years of education: Not on file  . Highest education level: 9th grade  Occupational History  . Occupation: retired  Engineer, production  . Financial resource strain: Not hard at all  . Food insecurity:    Worry: Never true    Inability: Never true  . Transportation needs:    Medical: No    Non-medical: No  Tobacco Use  . Smoking status: Former Smoker    Packs/day: 0.25    Years: 62.00    Pack years:  15.50    Types: Cigarettes    Last attempt to quit: 01/07/2018    Years since quitting: 0.3  . Smokeless tobacco: Never Used  Substance and Sexual Activity  . Alcohol use: Yes    Alcohol/week: 1.8 oz    Types: 2 Shots of liquor, 1 Cans of beer per week    Comment: 2 shots of liquor  a week, one beer occasionally   . Drug use: No  . Sexual activity: Not Currently  Lifestyle  . Physical activity:    Days per week: 0 days    Minutes per session: 0 min  . Stress: Not at all  Relationships  . Social connections:    Talks on phone: Once a week    Gets together: Once a week    Attends religious service: Never    Active member of club or organization: No    Attends meetings of clubs or organizations: Never    Relationship status:  Married  Other Topics Concern  . Not on file  Social History Narrative  . Not on file    Outpatient Encounter Medications as of 05/08/2018  Medication Sig  . albuterol (PROVENTIL HFA;VENTOLIN HFA) 108 (90 Base) MCG/ACT inhaler Inhale 2 puffs into the lungs every 6 (six) hours as needed for wheezing or shortness of breath.  Marland Kitchen aspirin 81 MG tablet Take 1 tablet (81 mg total) by mouth daily.  Marland Kitchen atorvastatin (LIPITOR) 10 MG tablet Take 1 tablet (10 mg total) by mouth every evening.  Marland Kitchen losartan-hydrochlorothiazide (HYZAAR) 50-12.5 MG tablet Take 1 tablet by mouth daily.   No facility-administered encounter medications on file as of 05/08/2018.     Activities of Daily Living In your present state of health, do you have any difficulty performing the following activities: 05/08/2018  Hearing? N  Vision? N  Difficulty concentrating or making decisions? N  Walking or climbing stairs? N  Dressing or bathing? N  Doing errands, shopping? N  Preparing Food and eating ? N  Using the Toilet? N  In the past six months, have you accidently leaked urine? N  Do you have problems with loss of bowel control? N  Managing your Medications? N  Managing your Finances? N  Housekeeping or managing your Housekeeping? N  Some recent data might be hidden    Patient Care Team: Gabriel Cirri, NP as PCP - General (Nurse Practitioner)   Assessment:   This is a routine wellness examination for William Murray.  Exercise Activities and Dietary recommendations Current Exercise Habits: The patient does not participate in regular exercise at present, Exercise limited by: None identified  Goals    . DIET - INCREASE WATER INTAKE     Recommend drinking at least 6-8 glasses of water a day        Fall Risk Fall Risk  05/08/2018 02/20/2018 05/04/2017  Falls in the past year? No No No   Is the patient's home free of loose throw rugs in walkways, pet beds, electrical cords, etc?   yes      Grab bars in the bathroom? no       Handrails on the stairs?   yes       Adequate lighting?   yes  Timed Get Up and Go Performed: Completed in 8 seconds with no use of assistive devices, steady gait. No intervention needed at this time.   Depression Screen PHQ 2/9 Scores 05/08/2018 02/20/2018 05/04/2017 04/17/2017  PHQ - 2 Score 0 0 0 0  Cognitive Function     6CIT Screen 05/08/2018 05/04/2017  What Year? 0 points 0 points  What month? 0 points 0 points  What time? 0 points 0 points  Count back from 20 0 points 0 points  Months in reverse 0 points 0 points  Repeat phrase 0 points 0 points  Total Score 0 0    Immunization History  Administered Date(s) Administered  . Influenza, High Dose Seasonal PF 08/21/2017  . Pneumococcal Conjugate-13 10/19/2013  . Pneumococcal Polysaccharide-23 10/19/2014  . Td 12/15/2009  . Zoster 10/19/2014    Qualifies for Shingles Vaccine? Yes, discussed shingrix vaccine.   Screening Tests Health Maintenance  Topic Date Due  . Fecal DNA (Cologuard)  07/16/1999  . INFLUENZA VACCINE  05/09/2018  . TETANUS/TDAP  12/16/2019  . Hepatitis C Screening  Completed  . PNA vac Low Risk Adult  Completed   Cancer Screenings: Lung: Low Dose CT Chest recommended if Age 43-80 years, 30 pack-year currently smoking OR have quit w/in 15years. Patient does qualify. Completed 03/2018 Colorectal: due now - cologuard ordered   Additional Screenings:  Hepatitis C Screening: completed 04/17/2017      Plan:    I have personally reviewed and addressed the Medicare Annual Wellness questionnaire and have noted the following in the patient's chart:  A. Medical and social history B. Use of alcohol, tobacco or illicit drugs  C. Current medications and supplements D. Functional ability and status E.  Nutritional status F.  Physical activity G. Advance directives H. List of other physicians I.  Hospitalizations, surgeries, and ER visits in previous 12 months J.  Vitals K. Screenings such as hearing  and vision if needed, cognitive and depression L. Referrals and appointments   In addition, I have reviewed and discussed with patient certain preventive protocols, quality metrics, and best practice recommendations. A written personalized care plan for preventive services as well as general preventive health recommendations were provided to patient.   Signed,  Marin Robertsiffany Damione Robideau, LPN Nurse Health Advisor   Nurse Notes: none

## 2018-05-09 DIAGNOSIS — E669 Obesity, unspecified: Secondary | ICD-10-CM | POA: Diagnosis not present

## 2018-05-09 DIAGNOSIS — I25119 Atherosclerotic heart disease of native coronary artery with unspecified angina pectoris: Secondary | ICD-10-CM | POA: Diagnosis not present

## 2018-05-09 DIAGNOSIS — E785 Hyperlipidemia, unspecified: Secondary | ICD-10-CM | POA: Diagnosis not present

## 2018-05-09 DIAGNOSIS — J449 Chronic obstructive pulmonary disease, unspecified: Secondary | ICD-10-CM | POA: Diagnosis not present

## 2018-05-09 DIAGNOSIS — K08409 Partial loss of teeth, unspecified cause, unspecified class: Secondary | ICD-10-CM | POA: Diagnosis not present

## 2018-05-09 DIAGNOSIS — I10 Essential (primary) hypertension: Secondary | ICD-10-CM | POA: Diagnosis not present

## 2018-05-09 DIAGNOSIS — J309 Allergic rhinitis, unspecified: Secondary | ICD-10-CM | POA: Diagnosis not present

## 2018-05-09 DIAGNOSIS — R69 Illness, unspecified: Secondary | ICD-10-CM | POA: Diagnosis not present

## 2018-05-09 DIAGNOSIS — Z6830 Body mass index (BMI) 30.0-30.9, adult: Secondary | ICD-10-CM | POA: Diagnosis not present

## 2018-05-09 DIAGNOSIS — I499 Cardiac arrhythmia, unspecified: Secondary | ICD-10-CM | POA: Diagnosis not present

## 2018-05-12 DIAGNOSIS — Z1212 Encounter for screening for malignant neoplasm of rectum: Secondary | ICD-10-CM | POA: Diagnosis not present

## 2018-05-12 DIAGNOSIS — Z1211 Encounter for screening for malignant neoplasm of colon: Secondary | ICD-10-CM | POA: Diagnosis not present

## 2018-05-12 LAB — COLOGUARD: Cologuard: NEGATIVE

## 2018-06-25 ENCOUNTER — Ambulatory Visit: Payer: Medicare HMO | Admitting: Nurse Practitioner

## 2018-06-28 ENCOUNTER — Ambulatory Visit: Payer: Medicare HMO | Admitting: Unknown Physician Specialty

## 2018-06-28 ENCOUNTER — Telehealth: Payer: Self-pay | Admitting: Physician Assistant

## 2018-06-28 ENCOUNTER — Ambulatory Visit (INDEPENDENT_AMBULATORY_CARE_PROVIDER_SITE_OTHER): Payer: Medicare HMO | Admitting: Unknown Physician Specialty

## 2018-06-28 ENCOUNTER — Encounter: Payer: Self-pay | Admitting: Unknown Physician Specialty

## 2018-06-28 VITALS — BP 162/77 | HR 64 | Temp 98.5°F | Ht 69.0 in | Wt 204.4 lb

## 2018-06-28 DIAGNOSIS — M109 Gout, unspecified: Secondary | ICD-10-CM | POA: Diagnosis not present

## 2018-06-28 DIAGNOSIS — I10 Essential (primary) hypertension: Secondary | ICD-10-CM | POA: Diagnosis not present

## 2018-06-28 DIAGNOSIS — Z23 Encounter for immunization: Secondary | ICD-10-CM | POA: Diagnosis not present

## 2018-06-28 MED ORDER — PREDNISONE 20 MG PO TABS: 40.0000 mg | ORAL_TABLET | Freq: Every day | ORAL | 0 refills | Status: DC

## 2018-06-28 MED ORDER — INDOMETHACIN ER 75 MG PO CPCR: 75.0000 mg | ORAL_CAPSULE | Freq: Two times a day (BID) | ORAL | 1 refills | Status: DC

## 2018-06-28 NOTE — Telephone Encounter (Signed)
PA submitted through CoverMyMeds.

## 2018-06-28 NOTE — Progress Notes (Signed)
BP (!) 162/77 (BP Location: Left Arm, Cuff Size: Normal)   Pulse 64   Temp 98.5 F (36.9 C) (Oral)   Ht 5\' 9"  (1.753 m)   Wt 204 lb 6.4 oz (92.7 kg)   SpO2 98%   BMI 30.18 kg/m    Subjective:    Patient ID: William Murray, male    DOB: 06-Sep-1951, 67 y.o.   MRN: [id number]  HPI: William Murray is a 67 y.o. male  Chief Complaint  Patient presents with  . Foot Pain    pt states he has had extremely bad right foot pain for the last 8 to 9 days    Gout Pt with complaints of right foot inflammation for over a week now.  Not taking any medications.  Had this before and took a pill and gout "went on out."  Anything touching foot is painful.  Labs done in May without electrolyte imbalance.  Last Uric Acid was 11.5.    Relevant past medical, surgical, family and social history reviewed and updated as indicated. Interim medical history since our last visit reviewed. Allergies and medications reviewed and updated.  Review of Systems  Per HPI unless specifically indicated above     Objective:    BP (!) 162/77 (BP Location: Left Arm, Cuff Size: Normal)   Pulse 64   Temp 98.5 F (36.9 C) (Oral)   Ht 5\' 9"  (1.753 m)   Wt 204 lb 6.4 oz (92.7 kg)   SpO2 98%   BMI 30.18 kg/m   Wt Readings from Last 3 Encounters:  06/28/18 204 lb 6.4 oz (92.7 kg)  05/08/18 206 lb 1.6 oz (93.5 kg)  03/19/18 200 lb (90.7 kg)    Physical Exam  Constitutional: He is oriented to person, place, and time. He appears well-developed and well-nourished. No distress.  HENT:  Head: Normocephalic and atraumatic.  Eyes: Conjunctivae and lids are normal. Right eye exhibits no discharge. Left eye exhibits no discharge. No scleral icterus.  Neck: Normal range of motion. Neck supple. No JVD present. Carotid bruit is not present.  Cardiovascular: Normal rate, regular rhythm and normal heart sounds.  Pulmonary/Chest: Effort normal and breath sounds normal. No respiratory distress.  Abdominal: Normal appearance.  There is no splenomegaly or hepatomegaly.  Musculoskeletal: Normal range of motion.  Right large MTP joint markedly swollen and tender to touch  Neurological: He is alert and oriented to person, place, and time.  Skin: Skin is warm, dry and intact. No rash noted. No pallor.  Psychiatric: He has a normal mood and affect. His behavior is normal. Judgment and thought content normal.    Results for orders placed or performed in visit on 05/27/18  Cologuard  Result Value Ref Range   Cologuard Negative       Assessment & Plan:   Problem List Items Addressed This Visit      Unprioritized   Acute gout    Pt with gout flare.  Start Prednisone 40 mg for 5 days.  Discussed cutting out drinking which doesn't seem like a possibility at this time.  Recheck in 2 weeks to discuss chronic management.  Start Indocin when completes Prednisone.         Relevant Orders   Comprehensive metabolic panel   Uric acid   Essential hypertension, benign    High today.  I suspect related to gout pain.  Recheck in 2 weeks       Other Visit Diagnoses    Need for  influenza vaccination    -  Primary   Relevant Orders   Flu vaccine HIGH DOSE PF (Completed)       Follow up plan: Return in about 2 weeks (around 07/12/2018).

## 2018-06-28 NOTE — Patient Instructions (Addendum)
Influenza (Flu) Vaccine (Inactivated or Recombinant): What You Need to Know 1. Why get vaccinated? Influenza ("flu") is a contagious disease that spreads around the [place] every year, usually between October and May. Flu is caused by influenza viruses, and is spread mainly by coughing, sneezing, and close contact. Anyone can get flu. Flu strikes suddenly and can last several days. Symptoms vary by age, but can include:  fever/chills  sore throat  muscle aches  fatigue  cough  headache  runny or stuffy nose  Flu can also lead to pneumonia and blood infections, and cause diarrhea and seizures in children. If you have a medical condition, such as heart or lung disease, flu can make it worse. Flu is more dangerous for some people. Infants and young children, people 65 years of age and older, pregnant women, and people with certain health conditions or a weakened immune system are at greatest risk. Each year thousands of people in the [place] die from flu, and many more are hospitalized. Flu vaccine can:  keep you from getting flu,  make flu less severe if you do get it, and  keep you from spreading flu to your family and other people. 2. Inactivated and recombinant flu vaccines A dose of flu vaccine is recommended every flu season. Children 6 months through 8 years of age may need two doses during the same flu season. Everyone else needs only one dose each flu season. Some inactivated flu vaccines contain a very small amount of a mercury-based preservative called thimerosal. Studies have not shown thimerosal in vaccines to be harmful, but flu vaccines that do not contain thimerosal are available. There is no live flu virus in flu shots. They cannot cause the flu. There are many flu viruses, and they are always changing. Each year a new flu vaccine is made to protect against three or four viruses that are likely to cause disease in the upcoming flu season. But even when the  vaccine doesn't exactly match these viruses, it may still provide some protection. Flu vaccine cannot prevent:  flu that is caused by a virus not covered by the vaccine, or  illnesses that look like flu but are not.  It takes about 2 weeks for protection to develop after vaccination, and protection lasts through the flu season. 3. Some people should not get this vaccine Tell the person who is giving you the vaccine:  If you have any severe, life-threatening allergies. If you ever had a life-threatening allergic reaction after a dose of flu vaccine, or have a severe allergy to any part of this vaccine, you may be advised not to get vaccinated. Most, but not all, types of flu vaccine contain a small amount of egg protein.  If you ever had Guillain-Barr Syndrome (also called GBS). Some people with a history of GBS should not get this vaccine. This should be discussed with your doctor.  If you are not feeling well. It is usually okay to get flu vaccine when you have a mild illness, but you might be asked to come back when you feel better.  4. Risks of a vaccine reaction With any medicine, including vaccines, there is a chance of reactions. These are usually mild and go away on their own, but serious reactions are also possible. Most people who get a flu shot do not have any problems with it. Minor problems following a flu shot include:  soreness, redness, or swelling where the shot was given  hoarseness  sore,   red or itchy eyes  cough  fever  aches  headache  itching  fatigue  If these problems occur, they usually begin soon after the shot and last 1 or 2 days. More serious problems following a flu shot can include the following:  There may be a small increased risk of Guillain-Barre Syndrome (GBS) after inactivated flu vaccine. This risk has been estimated at 1 or 2 additional cases per million people vaccinated. This is much lower than the risk of severe complications from  flu, which can be prevented by flu vaccine.  Young children who get the flu shot along with pneumococcal vaccine (PCV13) and/or DTaP vaccine at the same time might be slightly more likely to have a seizure caused by fever. Ask your doctor for more information. Tell your doctor if a child who is getting flu vaccine has ever had a seizure.  Problems that could happen after any injected vaccine:  People sometimes faint after a medical procedure, including vaccination. Sitting or lying down for about 15 minutes can help prevent fainting, and injuries caused by a fall. Tell your doctor if you feel dizzy, or have vision changes or ringing in the ears.  Some people get severe pain in the shoulder and have difficulty moving the arm where a shot was given. This happens very rarely.  Any medication can cause a severe allergic reaction. Such reactions from a vaccine are very rare, estimated at about 1 in a million doses, and would happen within a few minutes to a few hours after the vaccination. As with any medicine, there is a very remote chance of a vaccine causing a serious injury or death. The safety of vaccines is always being monitored. For more information, visit: http://www.aguilar.org/ 5. What if there is a serious reaction? What should I look for? Look for anything that concerns you, such as signs of a severe allergic reaction, very high fever, or unusual behavior. Signs of a severe allergic reaction can include hives, swelling of the face and throat, difficulty breathing, a fast heartbeat, dizziness, and weakness. These would start a few minutes to a few hours after the vaccination. What should I do?  If you think it is a severe allergic reaction or other emergency that can't wait, call 9-1-1 and get the person to the nearest hospital. Otherwise, call your doctor.  Reactions should be reported to the Vaccine Adverse Event Reporting System (VAERS). Your doctor should file this report, or you  can do it yourself through the VAERS web site at www.vaers.SamedayNews.es, or by calling 6094730752. ? VAERS does not give medical advice. 6. The National Vaccine Injury Compensation Program The [employer] Vaccine Injury Compensation Program (VICP) is a federal program that was created to compensate people who may have been injured by certain vaccines. Persons who believe they may have been injured by a vaccine can learn about the program and about filing a claim by calling 458-267-6070 or visiting the [employer] website at GoldCloset.com.ee. There is a time limit to file a claim for compensation. 7. How can I learn more?  Ask your healthcare provider. He or she can give you the vaccine package insert or suggest other sources of information.  Call your local or state health department.  Contact the Centers for Disease Control and Prevention (CDC): ? Call (540)164-9661 (1-800-CDC-INFO) or ? Visit CDC's website at https://gibson.com/ Vaccine Information Statement, Inactivated Influenza Vaccine (05/15/2014) This information is not intended to replace advice given to you by your health care provider. Make sure  you discuss any questions you have with your health care provider. ----------------------------------------------------------  Start Prednisone first.  Then take Indocin 75 mg twice a day/

## 2018-06-28 NOTE — Assessment & Plan Note (Addendum)
Pt with gout flare.  Start Prednisone 40 mg for 5 days.  Discussed cutting out drinking which doesn't seem like a possibility at this time.  Recheck in 2 weeks to discuss chronic management.  Start Indocin when completes Prednisone.

## 2018-06-28 NOTE — Telephone Encounter (Signed)
Pt came in stating that pharmacy is needing prior authorization for new prescription indomethacin

## 2018-06-28 NOTE — Assessment & Plan Note (Signed)
High today.  I suspect related to gout pain.  Recheck in 2 weeks

## 2018-06-29 LAB — COMPREHENSIVE METABOLIC PANEL
ALT: 10 IU/L (ref 0–44)
AST: 27 IU/L (ref 0–40)
Albumin/Globulin Ratio: 1.6 (ref 1.2–2.2)
Albumin: 4.4 g/dL (ref 3.6–4.8)
Alkaline Phosphatase: 84 IU/L (ref 39–117)
BUN/Creatinine Ratio: 19 (ref 10–24)
BUN: 18 mg/dL (ref 8–27)
Bilirubin Total: 0.4 mg/dL (ref 0.0–1.2)
CO2: 22 mmol/L (ref 20–29)
Calcium: 9.5 mg/dL (ref 8.6–10.2)
Chloride: 103 mmol/L (ref 96–106)
Creatinine, Ser: 0.96 mg/dL (ref 0.76–1.27)
GFR calc Af Amer: 94 mL/min/{1.73_m2} (ref >59)
GFR calc non Af Amer: 81 mL/min/{1.73_m2} (ref >59)
Globulin, Total: 2.8 g/dL (ref 1.5–4.5)
Glucose: 114 mg/dL — ABNORMAL HIGH (ref 65–99)
Potassium: 3.6 mmol/L (ref 3.5–5.2)
Sodium: 143 mmol/L (ref 134–144)
Total Protein: 7.2 g/dL (ref 6.0–8.5)

## 2018-06-29 LAB — URIC ACID: Uric Acid: 7.7 mg/dL (ref 3.7–8.6)

## 2018-07-01 ENCOUNTER — Encounter: Payer: Self-pay | Admitting: Unknown Physician Specialty

## 2018-07-01 NOTE — Telephone Encounter (Signed)
Patient notified and verbalized understanding. 

## 2018-07-01 NOTE — Telephone Encounter (Signed)
PA for indomethacin was denied. Is there anything we can send in for the patient?

## 2018-07-01 NOTE — Telephone Encounter (Signed)
Just have him take the prednisone, if he's not getting better, let us know

## 2018-07-12 ENCOUNTER — Encounter: Payer: Self-pay | Admitting: Unknown Physician Specialty

## 2018-07-12 ENCOUNTER — Ambulatory Visit (INDEPENDENT_AMBULATORY_CARE_PROVIDER_SITE_OTHER): Payer: Medicare Other | Admitting: Unknown Physician Specialty

## 2018-07-12 DIAGNOSIS — Z8739 Personal history of other diseases of the musculoskeletal system and connective tissue: Secondary | ICD-10-CM | POA: Diagnosis not present

## 2018-07-12 DIAGNOSIS — I10 Essential (primary) hypertension: Secondary | ICD-10-CM

## 2018-07-12 MED ORDER — ALLOPURINOL 100 MG PO TABS: ORAL_TABLET | ORAL | 6 refills | Status: DC

## 2018-07-12 MED ORDER — MELOXICAM 15 MG PO TABS: 15.0000 mg | ORAL_TABLET | Freq: Every day | ORAL | 0 refills | Status: DC

## 2018-07-12 NOTE — Assessment & Plan Note (Signed)
Not to goal.  Increase Lisinopril to 100/25 mg daily from 50/12.5mg .

## 2018-07-12 NOTE — Progress Notes (Signed)
BP (!) 146/87 (BP Location: Left Arm, Patient Position: Sitting, Cuff Size: Normal)   Pulse 64   Temp 99.1 F (37.3 C) (Tympanic)   Ht 5\' 9"  (1.753 m)   Wt 206 lb 9.6 oz (93.7 kg)   SpO2 97%   BMI 30.51 kg/m    Subjective:    Patient ID: William Murray, male    DOB: 1950/12/29, 67 y.o.   MRN: [id number]  HPI: William Murray is a 67 y.o. male  Chief Complaint  Patient presents with  . Hypertension    2 week F/U  . Gout    Hypertension  Using medications without difficulty Average home BPs "a little high"   Using medication without problems or lightheadedness No chest pain with exertion or shortness of breath No Edema  Gout Improved.  Unable to take Colchicine due to cost.  Uric acid was upper end of normal.     Relevant past medical, surgical, family and social history reviewed and updated as indicated. Interim medical history since our last visit reviewed. Allergies and medications reviewed and updated.  Review of Systems  Constitutional: Negative.   Respiratory: Negative.   Cardiovascular: Negative.   Gastrointestinal: Negative.   Psychiatric/Behavioral: Negative.     Per HPI unless specifically indicated above     Objective:    BP (!) 146/87 (BP Location: Left Arm, Patient Position: Sitting, Cuff Size: Normal)   Pulse 64   Temp 99.1 F (37.3 C) (Tympanic)   Ht 5\' 9"  (1.753 m)   Wt 206 lb 9.6 oz (93.7 kg)   SpO2 97%   BMI 30.51 kg/m   Wt Readings from Last 3 Encounters:  07/12/18 206 lb 9.6 oz (93.7 kg)  06/28/18 204 lb 6.4 oz (92.7 kg)  05/08/18 206 lb 1.6 oz (93.5 kg)    Physical Exam  Constitutional: He is oriented to person, place, and time. He appears well-developed and well-nourished. No distress.  HENT:  Head: Normocephalic and atraumatic.  Eyes: Conjunctivae and lids are normal. Right eye exhibits no discharge. Left eye exhibits no discharge. No scleral icterus.  Neck: Normal range of motion. Neck supple. No JVD present. Carotid bruit  is not present.  Cardiovascular: Normal rate, regular rhythm and normal heart sounds.  Pulmonary/Chest: Effort normal and breath sounds normal. No respiratory distress.  Abdominal: Normal appearance. There is no splenomegaly or hepatomegaly.  Musculoskeletal: Normal range of motion.  Neurological: He is alert and oriented to person, place, and time.  Skin: Skin is warm, dry and intact. No rash noted. No pallor.  Psychiatric: He has a normal mood and affect. His behavior is normal. Judgment and thought content normal.    Results for orders placed or performed in visit on 06/28/18  Comprehensive metabolic panel  Result Value Ref Range   Glucose 114 (H) 65 - 99 mg/dL   BUN 18 8 - 27 mg/dL   Creatinine, Ser 4.09 0.76 - 1.27 mg/dL   GFR calc non Af Amer 81 >59 mL/min/1.73   GFR calc Af Amer 94 >59 mL/min/1.73   BUN/Creatinine Ratio 19 10 - 24   Sodium 143 134 - 144 mmol/L   Potassium 3.6 3.5 - 5.2 mmol/L   Chloride 103 96 - 106 mmol/L   CO2 22 20 - 29 mmol/L   Calcium 9.5 8.6 - 10.2 mg/dL   Total Protein 7.2 6.0 - 8.5 g/dL   Albumin 4.4 3.6 - 4.8 g/dL   Globulin, Total 2.8 1.5 - 4.5 g/dL  Albumin/Globulin Ratio 1.6 1.2 - 2.2   Bilirubin Total 0.4 0.0 - 1.2 mg/dL   Alkaline Phosphatase 84 39 - 117 IU/L   AST 27 0 - 40 IU/L   ALT 10 0 - 44 IU/L  Uric acid  Result Value Ref Range   Uric Acid 7.7 3.7 - 8.6 mg/dL      Assessment & Plan:   Problem List Items Addressed This Visit      Unprioritized   Essential hypertension, benign    Not to goal.  Increase Lisinopril to 100/25 mg daily from 50/12.5mg .        History of gout    Start Allopurinol with taper up to 300 mg. Daily.  Take Meloxicam daily for about a month          Follow up plan: Return in about 4 weeks (around 08/09/2018) for Also needs Medicare wellness.

## 2018-07-12 NOTE — Assessment & Plan Note (Signed)
Start Allopurinol with taper up to 300 mg. Daily.  Take Meloxicam daily for about a month

## 2018-08-05 ENCOUNTER — Other Ambulatory Visit: Payer: Self-pay | Admitting: Unknown Physician Specialty

## 2018-08-05 NOTE — Telephone Encounter (Signed)
Per Cheryl's note she has only wanted patient to take this medication for about a month for gout.  No refills were ordered by her for this reason.

## 2018-08-05 NOTE — Telephone Encounter (Signed)
Requested medication (s) are due for refill today: unsure  Requested medication (s) are on the active medication list: yes    Last refill: 07/12/18  #30  0 refills  Future visit scheduled Yes 08/09/18  Notes to clinic:   Note from OV 10/4 reads to take Mobic x 1 month. Still on present med profile. Has appt 08/12/18  Requested Prescriptions  Pending Prescriptions Disp Refills   meloxicam (MOBIC) 15 MG tablet [Pharmacy Med Name: MELOXICAM 15 MG TABLET] 30 tablet 0    Sig: TAKE 1 TABLET BY MOUTH EVERY DAY     Analgesics:  COX2 Inhibitors Passed - 08/05/2018  2:21 AM      Passed - HGB in normal range and within 360 days    Hemoglobin  Date Value Ref Range Status  02/20/2018 16.3 13.0 - 17.7 g/dL Final         Passed - Cr in normal range and within 360 days    Creatinine  Date Value Ref Range Status  04/10/2014 1.20 0.60 - 1.30 mg/dL Final   Creatinine, Ser  Date Value Ref Range Status  06/28/2018 0.96 0.76 - 1.27 mg/dL Final         Passed - Patient is not pregnant      Passed - Valid encounter within last 12 months    Recent Outpatient Visits          3 weeks ago Essential hypertension, benign   [facility] Gabriel Cirri, NP   1 month ago Need for influenza vaccination   [facility] Gabriel Cirri, NP   5 months ago Essential hypertension, benign   [facility] Gabriel Cirri, NP   11 months ago Need for influenza vaccination   [facility] Gabriel Cirri, NP   1 year ago Chronic obstructive pulmonary disease, unspecified COPD type (HCC)   [facility] Gabriel Cirri, NP      Future Appointments            In 4 days Cannady, Dorie Rank, NP [facility]   In 9 months  [facility]

## 2018-08-05 NOTE — Telephone Encounter (Signed)
Called and spoke w/ patient. No more gout pain. Pt verbalized understanding of d/c medication after 1 month.

## 2018-08-09 ENCOUNTER — Ambulatory Visit (INDEPENDENT_AMBULATORY_CARE_PROVIDER_SITE_OTHER): Payer: Medicare Other | Admitting: Nurse Practitioner

## 2018-08-09 ENCOUNTER — Encounter: Payer: Self-pay | Admitting: Nurse Practitioner

## 2018-08-09 VITALS — BP 134/64 | HR 71 | Temp 98.1°F | Ht 68.25 in | Wt 209.8 lb

## 2018-08-09 DIAGNOSIS — M1A9XX0 Chronic gout, unspecified, without tophus (tophi): Secondary | ICD-10-CM | POA: Diagnosis not present

## 2018-08-09 DIAGNOSIS — E7800 Pure hypercholesterolemia, unspecified: Secondary | ICD-10-CM | POA: Diagnosis not present

## 2018-08-09 DIAGNOSIS — J449 Chronic obstructive pulmonary disease, unspecified: Secondary | ICD-10-CM

## 2018-08-09 DIAGNOSIS — Z0000 Encounter for general adult medical examination without abnormal findings: Secondary | ICD-10-CM | POA: Diagnosis not present

## 2018-08-09 DIAGNOSIS — I10 Essential (primary) hypertension: Secondary | ICD-10-CM | POA: Diagnosis not present

## 2018-08-09 DIAGNOSIS — Z8739 Personal history of other diseases of the musculoskeletal system and connective tissue: Secondary | ICD-10-CM | POA: Diagnosis not present

## 2018-08-09 DIAGNOSIS — Z720 Tobacco use: Secondary | ICD-10-CM

## 2018-08-09 NOTE — Assessment & Plan Note (Addendum)
Chronic, continue on statin therapy.  Lipid panel today.

## 2018-08-09 NOTE — Progress Notes (Signed)
BP 134/64 (BP Location: Left Arm, Patient Position: Sitting)   Pulse 71   Temp 98.1 F (36.7 C) (Oral)   Ht 5' 8.25" (1.734 m)   Wt 209 lb 12.8 oz (95.2 kg)   SpO2 97%   BMI 31.67 kg/m    Subjective:    Patient ID: William Murray, male    DOB: 1951/06/04, 67 y.o.   MRN: [id number]  HPI: William Murray is a 67 y.o. male presents for annual physical  Chief Complaint  Patient presents with  . Annual Exam    AWV done 05/08/2018   HYPERTENSION / HYPERLIPIDEMIA Takes medication daily.  Talked about BP goal for patient <130/90, not consistently at goal at home.  He is going to start documenting BP at home and sharing with provider.   He reports that sometimes it is high at home and then rechecks awhile later and it is down . Endorses not liking provider appointments and initial BP is "always up". Satisfied with current treatment? yes Duration of hypertension: chronic BP monitoring frequency: a few times a week BP range: 140's/60-80 BP medication side effects: no Past BP meds: not sure Duration of hyperlipidemia: chronic Cholesterol medication side effects: no Cholesterol supplements: none Past cholesterol medications: none Medication compliance: excellent compliance Aspirin: yes Recent stressors: no Recurrent headaches: no Visual changes: no Palpitations: no Dyspnea: no Chest pain: no Lower extremity edema: no Dizzy/lightheaded: no   COPD Has not used rescue inhaler in a year.  Denies cough or SOB with activity.  Reports he has not smoked in a "long while". COPD status: controlled Satisfied with current treatment?: yes Oxygen use: no Dyspnea frequency:  Cough frequency:  Rescue inhaler frequency:   Limitation of activity: no Productive cough:  Last Spirometry:  Pneumovax: Up to Date Influenza: Up to Date   GOUT Improved with daily medication use. Duration:chronic Right 1st metatarsophalangeal pain: no Left 1st metatarsophalangeal pain: no Right knee pain:  no Left knee pain: no Severity: none at this time, improved since medication  Quality: improved since taking medication Swelling: no Redness: no Trauma: no Recent dietary change or indiscretion: no Fevers: no Nausea/vomiting: no Aggravating factors: Alleviating factors:  Status:  better Treatments attempted: well-controlled with Allopurinol.  Relevant past medical, surgical, family and social history reviewed and updated as indicated. Interim medical history since our last visit reviewed. Allergies and medications reviewed and updated.  Review of Systems  Constitutional: Negative for activity change, fatigue and fever.  HENT: Positive for dental problem. Negative for congestion, ear pain, rhinorrhea, sinus pressure, sinus pain, sore throat, tinnitus, trouble swallowing and voice change.        Reports "not many teeth left"  Eyes: Negative for pain, discharge and redness.  Respiratory: Negative for cough, chest tightness, shortness of breath and wheezing.   Cardiovascular: Negative for chest pain, palpitations and leg swelling.  Gastrointestinal: Negative for abdominal distention and abdominal pain.  Endocrine: Negative for cold intolerance, polydipsia, polyphagia and polyuria.  Genitourinary: Negative for decreased urine volume, difficulty urinating, frequency and urgency.  Musculoskeletal: Negative for arthralgias, back pain, gait problem, myalgias and neck pain.  Skin: Negative.   Allergic/Immunologic: Negative.   Neurological: Negative for dizziness, tremors, syncope, weakness, light-headedness, numbness and headaches.  Hematological: Negative.   Psychiatric/Behavioral: Negative for behavioral problems, confusion and sleep disturbance. The patient is not nervous/anxious.     Per HPI unless specifically indicated above     Objective:    BP 134/64 (BP Location: Left Arm, Patient Position:  Sitting)   Pulse 71   Temp 98.1 F (36.7 C) (Oral)   Ht 5' 8.25" (1.734 m)   Wt  209 lb 12.8 oz (95.2 kg)   SpO2 97%   BMI 31.67 kg/m   Wt Readings from Last 3 Encounters:  08/09/18 209 lb 12.8 oz (95.2 kg)  07/12/18 206 lb 9.6 oz (93.7 kg)  06/28/18 204 lb 6.4 oz (92.7 kg)    Physical Exam  Constitutional: He is oriented to person, place, and time. He appears well-developed and well-nourished.  HENT:  Head: Normocephalic and atraumatic.  Right Ear: Hearing, tympanic membrane, external ear and ear canal normal. No drainage. No decreased hearing is noted.  Left Ear: Hearing, tympanic membrane, external ear and ear canal normal. No drainage. No decreased hearing is noted.  Nose: Nose normal. Right sinus exhibits no maxillary sinus tenderness and no frontal sinus tenderness. Left sinus exhibits no maxillary sinus tenderness and no frontal sinus tenderness.  Mouth/Throat: Uvula is midline, oropharynx is clear and moist and mucous membranes are normal. Abnormal dentition.  Poor dentition  Eyes: Pupils are equal, round, and reactive to light. Conjunctivae, EOM and lids are normal. Right eye exhibits no discharge. Left eye exhibits no discharge.  Neck: Trachea normal and normal range of motion. Neck supple. No JVD present. Carotid bruit is not present. No thyromegaly present.  Cardiovascular: Normal rate, regular rhythm, S1 normal, S2 normal, normal heart sounds and intact distal pulses. Exam reveals no gallop.  No murmur heard. Pulmonary/Chest: Effort normal and breath sounds normal.  Abdominal: Soft. Bowel sounds are normal. There is no splenomegaly or hepatomegaly.  Musculoskeletal: Normal range of motion.  Lymphadenopathy:    He has no cervical adenopathy.  Neurological: He is alert and oriented to person, place, and time. He has normal strength and normal reflexes. No cranial nerve deficit. He displays a negative Romberg sign.  Reflex Scores:      Brachioradialis reflexes are 2+ on the right side and 2+ on the left side.      Patellar reflexes are 2+ on the right side  and 2+ on the left side. Skin: Skin is warm, dry and intact. Capillary refill takes less than 2 seconds. No rash noted.  Psychiatric: He has a normal mood and affect. His behavior is normal. Judgment and thought content normal.  Nursing note and vitals reviewed.   Results for orders placed or performed in visit on 06/28/18  Comprehensive metabolic panel  Result Value Ref Range   Glucose 114 (H) 65 - 99 mg/dL   BUN 18 8 - 27 mg/dL   Creatinine, Ser 1.61 0.76 - 1.27 mg/dL   GFR calc non Af Amer 81 >59 mL/min/1.73   GFR calc Af Amer 94 >59 mL/min/1.73   BUN/Creatinine Ratio 19 10 - 24   Sodium 143 134 - 144 mmol/L   Potassium 3.6 3.5 - 5.2 mmol/L   Chloride 103 96 - 106 mmol/L   CO2 22 20 - 29 mmol/L   Calcium 9.5 8.6 - 10.2 mg/dL   Total Protein 7.2 6.0 - 8.5 g/dL   Albumin 4.4 3.6 - 4.8 g/dL   Globulin, Total 2.8 1.5 - 4.5 g/dL   Albumin/Globulin Ratio 1.6 1.2 - 2.2   Bilirubin Total 0.4 0.0 - 1.2 mg/dL   Alkaline Phosphatase 84 39 - 117 IU/L   AST 27 0 - 40 IU/L   ALT 10 0 - 44 IU/L  Uric acid  Result Value Ref Range   Uric Acid 7.7  3.7 - 8.6 mg/dL      Assessment & Plan:   Problem List Items Addressed This Visit      Cardiovascular and Mediastinum   Essential hypertension, benign    Initial BP elevated, with improvement 134/64 on repeat.  He is going to keep logs of home BP to next appointment and will adjust current dose medication if needed.  CMP today.      Relevant Orders   Comprehensive metabolic panel     Respiratory   Stage 1 mild COPD by GOLD classification (HCC)    Stable, minimal use rescue inhaler.  Continue regimen.        Other   Hypercholesteremia    Chronic, continue on statin therapy.  Lipid panel today.      Relevant Orders   Lipid Panel w/o Chol/HDL Ratio   Chronic gout    Chronic, well-controlled on Allopurinol.  Continue to monitor.      Tobacco abuse    Not currently smoking.  Benign findings 03/2018 CT.  Repeat June 2020.         Other Visit Diagnoses    Annual physical exam    -  Primary   Relevant Orders   Comprehensive metabolic panel   CBC with Differential/Platelet   TSH   Lipid Panel w/o Chol/HDL Ratio   HgB A1c   Uric acid   PSA       Follow up plan: Return in about 6 months (around 02/07/2019) for HTN and HLD (labs needed).

## 2018-08-09 NOTE — Assessment & Plan Note (Addendum)
Chronic, well-controlled on Allopurinol.  Continue to monitor.

## 2018-08-09 NOTE — Assessment & Plan Note (Addendum)
Initial BP elevated, with improvement 134/64 on repeat.  He is going to keep logs of home BP to next appointment and will adjust current dose medication if needed.  CMP today.

## 2018-08-09 NOTE — Patient Instructions (Addendum)
DASH Eating Plan DASH stands for "Dietary Approaches to Stop Hypertension." The DASH eating plan is a healthy eating plan that has been shown to reduce high blood pressure (hypertension). It may also reduce your risk for type 2 diabetes, heart disease, and stroke. The DASH eating plan may also help with weight loss. What are tips for following this plan? General guidelines  Avoid eating more than 2,300 mg (milligrams) of salt (sodium) a day. If you have hypertension, you may need to reduce your sodium intake to 1,500 mg a day.  Limit alcohol intake to no more than 1 drink a day for nonpregnant women and 2 drinks a day for men. One drink equals 12 oz of beer, 5 oz of wine, or 1 oz of hard liquor.  Work with your health care provider to maintain a healthy body weight or to lose weight. Ask what an ideal weight is for you.  Get at least 30 minutes of exercise that causes your heart to beat faster (aerobic exercise) most days of the week. Activities may include walking, swimming, or biking.  Work with your health care provider or diet and nutrition specialist (dietitian) to adjust your eating plan to your individual calorie needs. Reading food labels  Check food labels for the amount of sodium per serving. Choose foods with less than 5 percent of the Daily Value of sodium. Generally, foods with less than 300 mg of sodium per serving fit into this eating plan.  To find whole grains, look for the word "whole" as the first word in the ingredient list. Shopping  Buy products labeled as "low-sodium" or "no salt added."  Buy fresh foods. Avoid canned foods and premade or frozen meals. Cooking  Avoid adding salt when cooking. Use salt-free seasonings or herbs instead of table salt or sea salt. Check with your health care provider or pharmacist before using salt substitutes.  Do not fry foods. Cook foods using healthy methods such as baking, boiling, grilling, and broiling instead.  Cook with  heart-healthy oils, such as olive, canola, soybean, or sunflower oil. Meal planning   Eat a balanced diet that includes: ? 5 or more servings of fruits and vegetables each day. At each meal, try to fill half of your plate with fruits and vegetables. ? Up to 6-8 servings of whole grains each day. ? Less than 6 oz of lean meat, poultry, or fish each day. A 3-oz serving of meat is about the same size as a deck of cards. One egg equals 1 oz. ? 2 servings of low-fat dairy each day. ? A serving of nuts, seeds, or beans 5 times each week. ? Heart-healthy fats. Healthy fats called Omega-3 fatty acids are found in foods such as flaxseeds and coldwater fish, like sardines, salmon, and mackerel.  Limit how much you eat of the following: ? Canned or prepackaged foods. ? Food that is high in trans fat, such as fried foods. ? Food that is high in saturated fat, such as fatty meat. ? Sweets, desserts, sugary drinks, and other foods with added sugar. ? Full-fat dairy products.  Do not salt foods before eating.  Try to eat at least 2 vegetarian meals each week.  Eat more home-cooked food and less restaurant, buffet, and fast food.  When eating at a restaurant, ask that your food be prepared with less salt or no salt, if possible. What foods are recommended? The items listed may not be a complete list. Talk with your dietitian about what   dietary choices are best for you. Grains Whole-grain or whole-wheat bread. Whole-grain or whole-wheat pasta. Brown rice. Oatmeal. Quinoa. Bulgur. Whole-grain and low-sodium cereals. Pita bread. Low-fat, low-sodium crackers. Whole-wheat flour tortillas. Vegetables Fresh or frozen vegetables (raw, steamed, roasted, or grilled). Low-sodium or reduced-sodium tomato and vegetable juice. Low-sodium or reduced-sodium tomato sauce and tomato paste. Low-sodium or reduced-sodium canned vegetables. Fruits All fresh, dried, or frozen fruit. Canned fruit in natural juice (without  added sugar). Meat and other protein foods Skinless chicken or turkey. Ground chicken or turkey. Pork with fat trimmed off. Fish and seafood. Egg whites. Dried beans, peas, or lentils. Unsalted nuts, nut butters, and seeds. Unsalted canned beans. Lean cuts of beef with fat trimmed off. Low-sodium, lean deli meat. Dairy Low-fat (1%) or fat-free (skim) milk. Fat-free, low-fat, or reduced-fat cheeses. Nonfat, low-sodium ricotta or cottage cheese. Low-fat or nonfat yogurt. Low-fat, low-sodium cheese. Fats and oils Soft margarine without trans fats. Vegetable oil. Low-fat, reduced-fat, or light mayonnaise and salad dressings (reduced-sodium). Canola, safflower, olive, soybean, and sunflower oils. Avocado. Seasoning and other foods Herbs. Spices. Seasoning mixes without salt. Unsalted popcorn and pretzels. Fat-free sweets. What foods are not recommended? The items listed may not be a complete list. Talk with your dietitian about what dietary choices are best for you. Grains Baked goods made with fat, such as croissants, muffins, or some breads. Dry pasta or rice meal packs. Vegetables Creamed or fried vegetables. Vegetables in a cheese sauce. Regular canned vegetables (not low-sodium or reduced-sodium). Regular canned tomato sauce and paste (not low-sodium or reduced-sodium). Regular tomato and vegetable juice (not low-sodium or reduced-sodium). Pickles. Olives. Fruits Canned fruit in a light or heavy syrup. Fried fruit. Fruit in cream or butter sauce. Meat and other protein foods Fatty cuts of meat. Ribs. Fried meat. Bacon. Sausage. Bologna and other processed lunch meats. Salami. Fatback. Hotdogs. Bratwurst. Salted nuts and seeds. Canned beans with added salt. Canned or smoked fish. Whole eggs or egg yolks. Chicken or turkey with skin. Dairy Whole or 2% milk, cream, and half-and-half. Whole or full-fat cream cheese. Whole-fat or sweetened yogurt. Full-fat cheese. Nondairy creamers. Whipped toppings.  Processed cheese and cheese spreads. Fats and oils Butter. Stick margarine. Lard. Shortening. Ghee. Bacon fat. Tropical oils, such as coconut, palm kernel, or palm oil. Seasoning and other foods Salted popcorn and pretzels. Onion salt, garlic salt, seasoned salt, table salt, and sea salt. Worcestershire sauce. Tartar sauce. Barbecue sauce. Teriyaki sauce. Soy sauce, including reduced-sodium. Steak sauce. Canned and packaged gravies. Fish sauce. Oyster sauce. Cocktail sauce. Horseradish that you find on the shelf. Ketchup. Mustard. Meat flavorings and tenderizers. Bouillon cubes. Hot sauce and Tabasco sauce. Premade or packaged marinades. Premade or packaged taco seasonings. Relishes. Regular salad dressings. Where to find more information:  National Heart, Lung, and Blood Institute: www.nhlbi.nih.gov  American Heart Association: www.heart.org Summary  The DASH eating plan is a healthy eating plan that has been shown to reduce high blood pressure (hypertension). It may also reduce your risk for type 2 diabetes, heart disease, and stroke.  With the DASH eating plan, you should limit salt (sodium) intake to 2,300 mg a day. If you have hypertension, you may need to reduce your sodium intake to 1,500 mg a day.  When on the DASH eating plan, aim to eat more fresh fruits and vegetables, whole grains, lean proteins, low-fat dairy, and heart-healthy fats.  Work with your health care provider or diet and nutrition specialist (dietitian) to adjust your eating plan to your individual   calorie needs. This information is not intended to replace advice given to you by your health care provider. Make sure you discuss any questions you have with your health care provider. Document Released: 09/14/2011 Document Revised: 09/18/2016 Document Reviewed: 09/18/2016 Elsevier Interactive Patient Education  2018 [employer]. Gout Gout is painful swelling that can happen in some of your joints. Gout is a type of  arthritis. This condition is caused by having too much uric acid in your body. Uric acid is a chemical that is made when your body breaks down substances called purines. If your body has too much uric acid, sharp crystals can form and build up in your joints. This causes pain and swelling. Gout attacks can happen quickly and be very painful (acute gout). Over time, the attacks can affect more joints and happen more often (chronic gout). Follow these instructions at home: During a Gout Attack  If directed, put ice on the painful area: ? Put ice in a plastic bag. ? Place a towel between your skin and the bag. ? Leave the ice on for 20 minutes, 2-3 times a day.  Rest the joint as much as possible. If the joint is in your leg, you may be given crutches to use.  Raise (elevate) the painful joint above the level of your heart as often as you can.  Drink enough fluids to keep your pee (urine) clear or pale yellow.  Take over-the-counter and prescription medicines only as told by your doctor.  Do not drive or use heavy machinery while taking prescription pain medicine.  Follow instructions from your doctor about what you can or cannot eat and drink.  Return to your normal activities as told by your doctor. Ask your doctor what activities are safe for you. Avoiding Future Gout Attacks  Follow a low-purine diet as told by a specialist (dietitian) or your doctor. Avoid foods and drinks that have a lot of purines, such as: ? Liver. ? Kidney. ? Anchovies. ? Asparagus. ? Herring. ? Mushrooms ? Mussels. ? Beer.  Limit alcohol intake to no more than 1 drink a day for nonpregnant women and 2 drinks a day for men. One drink equals 12 oz of beer, 5 oz of wine, or 1 oz of hard liquor.  Stay at a healthy weight or lose weight if you are overweight. If you want to lose weight, talk with your doctor. It is important that you do not lose weight too fast.  Start or continue an exercise plan as told by  your doctor.  Drink enough fluids to keep your pee clear or pale yellow.  Take over-the-counter and prescription medicines only as told by your doctor.  Keep all follow-up visits as told by your doctor. This is important. Contact a doctor if:  You have another gout attack.  You still have symptoms of a gout attack after10 days of treatment.  You have problems (side effects) because of your medicines.  You have chills or a fever.  You have burning pain when you pee (urinate).  You have pain in your lower back or belly. Get help right away if:  You have very bad pain.  Your pain cannot be controlled.  You cannot pee. This information is not intended to replace advice given to you by your health care provider. Make sure you discuss any questions you have with your health care provider. Document Released: 07/04/2008 Document Revised: 03/02/2016 Document Reviewed: 07/08/2015 Elsevier Interactive Patient Education  2018 [employer]. Prostate-Specific  Antigen Test Why am I having this test? The prostate-specific antigen (PSA) test is performed to determine how much PSA you have in your blood. PSA is a type of protein that is normally present in the prostate gland. Certain conditions can cause PSA blood levels to increase, such as:  Infection in the prostate (prostatitis).  Enlargement of the prostate (hypertrophy).  Prostate cancer.  Because PSA levels increase greatly from prostate cancer, this test can be used to confirm a diagnosis of prostate cancer. It may also be used to monitor treatment for prostate cancer and to watch for a return of prostate cancer after treatment has finished. This test has a very high false-positive rate. Therefore, routine PSA screening for all men is no longer recommended. A false-positive result is incorrect because it indicates a condition or finding is present when it is not. What kind of sample is taken? A blood sample is required for this  test. It is usually collected by inserting a needle into a vein or by sticking a finger with a small needle. How do I prepare for this test? There is no preparation required for this test. However, there are factors that can affect the results of a PSA test. To get the most accurate results:  Avoid having a rectal exam within several hours before having your blood drawn for this test.  Avoid having any procedures performed on the prostate gland within 6 weeks of having this test.  Avoid ejaculating within 24 hours of having this test.  Tell your health care provider if you had a recent urinary tract infection (UTI).  Tell your health care provider if you are taking medicines to assist with hair growth, such as finasteride.  Tell your health care provider if you have been exposed to a medicine called diethylstilbestrol.  Let your health care provider know if any of these factors apply to you. You may be asked to reschedule the test. What are the reference ranges? Reference ranges are established after testing a large group of people. Reference ranges may vary among different people, labs, and hospitals. It is your responsibility to obtain your test results. Ask the lab or department performing the test when and how you will get your results.  Low: 0-2.5 ng/mL.  Slightly to moderately elevated: 2.6-10.0 ng/mL.  Moderately elevated: 10.0-19.9 ng/mL.  Significantly elevated: 20 ng/mL or greater.  What do the results mean? PSA test results greater than 4 ng/mL are found in the majority of men with prostate cancer. If your test result is above this level, this can indicate an increased risk for prostate cancer. Increased PSA levels can also indicate other health conditions. Talk with your health care provider to discuss your results, treatment options, and if necessary, the need for more tests. Talk with your health care provider if you have any questions about your results. Talk with your  health care provider to discuss your results, treatment options, and if necessary, the need for more tests. Talk with your health care provider if you have any questions about your results. This information is not intended to replace advice given to you by your health care provider. Make sure you discuss any questions you have with your health care provider. Document Released: 10/28/2004 Document Revised: 05/31/2016 Document Reviewed: 02/18/2014 Elsevier Interactive Patient Education  [employer].

## 2018-08-09 NOTE — Assessment & Plan Note (Signed)
Not currently smoking.  Benign findings 03/2018 CT.  Repeat June 2020.

## 2018-08-09 NOTE — Assessment & Plan Note (Addendum)
Stable, minimal use rescue inhaler.  Continue regimen.

## 2018-08-10 LAB — CBC WITH DIFFERENTIAL/PLATELET
Basophils Absolute: 0 10*3/uL (ref 0.0–0.2)
Basos: 1 %
EOS (ABSOLUTE): 0.1 10*3/uL (ref 0.0–0.4)
Eos: 1 %
Hematocrit: 47.4 % (ref 37.5–51.0)
Hemoglobin: 16.9 g/dL (ref 13.0–17.7)
Immature Grans (Abs): 0 10*3/uL (ref 0.0–0.1)
Immature Granulocytes: 0 %
Lymphocytes Absolute: 2.7 10*3/uL (ref 0.7–3.1)
Lymphs: 34 %
MCH: 32.8 pg (ref 26.6–33.0)
MCHC: 35.7 g/dL (ref 31.5–35.7)
MCV: 92 fL (ref 79–97)
Monocytes Absolute: 0.5 10*3/uL (ref 0.1–0.9)
Monocytes: 7 %
Neutrophils Absolute: 4.6 10*3/uL (ref 1.4–7.0)
Neutrophils: 57 %
Platelets: 219 10*3/uL (ref 150–450)
RBC: 5.16 x10E6/uL (ref 4.14–5.80)
RDW: 13 % (ref 12.3–15.4)
WBC: 8 10*3/uL (ref 3.4–10.8)

## 2018-08-10 LAB — COMPREHENSIVE METABOLIC PANEL
ALT: 12 IU/L (ref 0–44)
AST: 24 IU/L (ref 0–40)
Albumin/Globulin Ratio: 1.5 (ref 1.2–2.2)
Albumin: 4.5 g/dL (ref 3.6–4.8)
Alkaline Phosphatase: 92 IU/L (ref 39–117)
BUN/Creatinine Ratio: 12 (ref 10–24)
BUN: 14 mg/dL (ref 8–27)
Bilirubin Total: 0.6 mg/dL (ref 0.0–1.2)
CO2: 24 mmol/L (ref 20–29)
Calcium: 9.9 mg/dL (ref 8.6–10.2)
Chloride: 101 mmol/L (ref 96–106)
Creatinine, Ser: 1.14 mg/dL (ref 0.76–1.27)
GFR calc Af Amer: 75 mL/min/{1.73_m2} (ref >59)
GFR calc non Af Amer: 65 mL/min/{1.73_m2} (ref >59)
Globulin, Total: 3.1 g/dL (ref 1.5–4.5)
Glucose: 127 mg/dL — ABNORMAL HIGH (ref 65–99)
Potassium: 3.8 mmol/L (ref 3.5–5.2)
Sodium: 139 mmol/L (ref 134–144)
Total Protein: 7.6 g/dL (ref 6.0–8.5)

## 2018-08-10 LAB — URIC ACID: Uric Acid: 6 mg/dL (ref 3.7–8.6)

## 2018-08-10 LAB — HEMOGLOBIN A1C
Est. average glucose Bld gHb Est-mCnc: 111 mg/dL
Hgb A1c MFr Bld: 5.5 % (ref 4.8–5.6)

## 2018-08-10 LAB — LIPID PANEL W/O CHOL/HDL RATIO
Cholesterol, Total: 136 mg/dL (ref 100–199)
HDL: 33 mg/dL — ABNORMAL LOW (ref >39)
LDL Calculated: 55 mg/dL (ref 0–99)
Triglycerides: 242 mg/dL — ABNORMAL HIGH (ref 0–149)
VLDL Cholesterol Cal: 48 mg/dL — ABNORMAL HIGH (ref 5–40)

## 2018-08-10 LAB — PSA: Prostate Specific Ag, Serum: 1.1 ng/mL (ref 0.0–4.0)

## 2018-08-10 LAB — TSH: TSH: 5.3 u[IU]/mL — ABNORMAL HIGH (ref 0.450–4.500)

## 2018-08-26 ENCOUNTER — Encounter: Payer: Medicare HMO | Admitting: Nurse Practitioner

## 2018-09-01 ENCOUNTER — Other Ambulatory Visit: Payer: Self-pay | Admitting: Unknown Physician Specialty

## 2018-09-02 NOTE — Telephone Encounter (Signed)
[facility] called and spoke to SheldonRyan, [facility] about refill request. He says the patient has refills, but is requesting a 90 day supply at his next refill due in February, 2020. He says the patient picked up a 90 day supply on 08/09/18. I advised I will have to refuse it for now, because it is too early to refill.

## 2018-09-30 ENCOUNTER — Other Ambulatory Visit: Payer: Self-pay | Admitting: Unknown Physician Specialty

## 2018-11-11 ENCOUNTER — Other Ambulatory Visit: Payer: Self-pay | Admitting: Unknown Physician Specialty

## 2018-11-21 ENCOUNTER — Other Ambulatory Visit: Payer: Self-pay | Admitting: Unknown Physician Specialty

## 2018-11-21 NOTE — Telephone Encounter (Signed)
Requested medication (s) are due for refill today yes  Requested medication (s) are on the active medication list yes  Future visit scheduled yes  Last refill: 07/12/18  Notes to clinic: Patient needs new Rx with correct directions and # of pills. Original Rx written by Jamesetta Orleans- please change to PCP.  Requested Prescriptions  Pending Prescriptions Disp Refills   allopurinol (ZYLOPRIM) 100 MG tablet [Pharmacy Med Name: ALLOPURINOL 100 MG TABLET] 90 tablet 2    Sig: Take one daily for 1 month Then take 2 daily for 1 month Then take 3 daily     Endocrinology:  Gout Agents Passed - 11/21/2018  2:31 PM      Passed - Uric Acid in normal range and within 360 days    Uric Acid  Date Value Ref Range Status  08/09/2018 6.0 3.7 - 8.6 mg/dL Final    Comment:               Therapeutic target for gout patients: <6.0         Passed - Cr in normal range and within 360 days    Creatinine  Date Value Ref Range Status  04/10/2014 1.20 0.60 - 1.30 mg/dL Final   Creatinine, Ser  Date Value Ref Range Status  08/09/2018 1.14 0.76 - 1.27 mg/dL Final         Passed - Valid encounter within last 12 months    Recent Outpatient Visits          3 months ago Annual physical exam   [facility] [place], Corrie Dandy T, NP   4 months ago Essential hypertension, benign   [facility] [place], Elnita Maxwell, NP   4 months ago Need for influenza vaccination   [facility] Gabriel Cirri, NP   9 months ago Essential hypertension, benign   [facility] Gabriel Cirri, NP   1 year ago Need for influenza vaccination   [facility] Gabriel Cirri, NP      Future Appointments            In 2 months Cannady, Dorie Rank, NP [facility]   In 5 months  [facility]            Requested Prescriptions  Pending Prescriptions Disp Refills   allopurinol (ZYLOPRIM) 100 MG tablet [Pharmacy Med Name: ALLOPURINOL 100 MG TABLET] 90  tablet 2    Sig: Take one daily for 1 month Then take 2 daily for 1 month Then take 3 daily     Endocrinology:  Gout Agents Passed - 11/21/2018  2:31 PM      Passed - Uric Acid in normal range and within 360 days    Uric Acid  Date Value Ref Range Status  08/09/2018 6.0 3.7 - 8.6 mg/dL Final    Comment:               Therapeutic target for gout patients: <6.0         Passed - Cr in normal range and within 360 days    Creatinine  Date Value Ref Range Status  04/10/2014 1.20 0.60 - 1.30 mg/dL Final   Creatinine, Ser  Date Value Ref Range Status  08/09/2018 1.14 0.76 - 1.27 mg/dL Final         Passed - Valid encounter within last 12 months    Recent Outpatient Visits          3 months ago Annual physical exam  [facility] [place], [place] T, NP   4 months ago Essential hypertension, benign   [facility] Gabriel Cirri, NP   4 months ago Need for influenza vaccination   [facility] Gabriel Cirri, NP   9 months ago Essential hypertension, benign   [facility] Gabriel Cirri, NP   1 year ago Need for influenza vaccination   [facility] Gabriel Cirri, NP      Future Appointments            In 2 months Cannady, Dorie Rank, NP [facility]   In 5 months  [facility]

## 2019-02-10 ENCOUNTER — Other Ambulatory Visit: Payer: Self-pay | Admitting: Unknown Physician Specialty

## 2019-02-12 ENCOUNTER — Other Ambulatory Visit: Payer: Self-pay

## 2019-02-12 ENCOUNTER — Encounter: Payer: Self-pay | Admitting: Nurse Practitioner

## 2019-02-12 ENCOUNTER — Ambulatory Visit (INDEPENDENT_AMBULATORY_CARE_PROVIDER_SITE_OTHER): Payer: Medicare Other | Admitting: Nurse Practitioner

## 2019-02-12 VITALS — BP 138/76 | HR 76 | Ht 69.0 in | Wt 198.0 lb

## 2019-02-12 DIAGNOSIS — I10 Essential (primary) hypertension: Secondary | ICD-10-CM

## 2019-02-12 DIAGNOSIS — E038 Other specified hypothyroidism: Secondary | ICD-10-CM | POA: Insufficient documentation

## 2019-02-12 DIAGNOSIS — E7800 Pure hypercholesterolemia, unspecified: Secondary | ICD-10-CM | POA: Diagnosis not present

## 2019-02-12 DIAGNOSIS — R7989 Other specified abnormal findings of blood chemistry: Secondary | ICD-10-CM | POA: Diagnosis not present

## 2019-02-12 MED ORDER — ATORVASTATIN CALCIUM 10 MG PO TABS: ORAL_TABLET | ORAL | 2 refills | Status: DC

## 2019-02-12 MED ORDER — LOSARTAN POTASSIUM-HCTZ 50-12.5 MG PO TABS: 1.0000 | ORAL_TABLET | Freq: Every day | ORAL | 2 refills | Status: DC

## 2019-02-12 NOTE — Patient Instructions (Signed)
DASH Eating Plan  DASH stands for "Dietary Approaches to Stop Hypertension." The DASH eating plan is a healthy eating plan that has been shown to reduce high blood pressure (hypertension). It may also reduce your risk for type 2 diabetes, heart disease, and stroke. The DASH eating plan may also help with weight loss.  What are tips for following this plan?    General guidelines   Avoid eating more than 2,300 mg (milligrams) of salt (sodium) a day. If you have hypertension, you may need to reduce your sodium intake to 1,500 mg a day.   Limit alcohol intake to no more than 1 drink a day for nonpregnant women and 2 drinks a day for men. One drink equals 12 oz of beer, 5 oz of wine, or 1 oz of hard liquor.   Work with your health care provider to maintain a healthy body weight or to lose weight. Ask what an ideal weight is for you.   Get at least 30 minutes of exercise that causes your heart to beat faster (aerobic exercise) most days of the week. Activities may include walking, swimming, or biking.   Work with your health care provider or diet and nutrition specialist (dietitian) to adjust your eating plan to your individual calorie needs.  Reading food labels     Check food labels for the amount of sodium per serving. Choose foods with less than 5 percent of the Daily Value of sodium. Generally, foods with less than 300 mg of sodium per serving fit into this eating plan.   To find whole grains, look for the word "whole" as the first word in the ingredient list.  Shopping   Buy products labeled as "low-sodium" or "no salt added."   Buy fresh foods. Avoid canned foods and premade or frozen meals.  Cooking   Avoid adding salt when cooking. Use salt-free seasonings or herbs instead of table salt or sea salt. Check with your health care provider or pharmacist before using salt substitutes.   Do not fry foods. Cook foods using healthy methods such as baking, boiling, grilling, and broiling instead.   Cook with  heart-healthy oils, such as olive, canola, soybean, or sunflower oil.  Meal planning   Eat a balanced diet that includes:  ? 5 or more servings of fruits and vegetables each day. At each meal, try to fill half of your plate with fruits and vegetables.  ? Up to 6-8 servings of whole grains each day.  ? Less than 6 oz of lean meat, poultry, or fish each day. A 3-oz serving of meat is about the same size as a deck of cards. One egg equals 1 oz.  ? 2 servings of low-fat dairy each day.  ? A serving of nuts, seeds, or beans 5 times each week.  ? Heart-healthy fats. Healthy fats called Omega-3 fatty acids are found in foods such as flaxseeds and coldwater fish, like sardines, salmon, and mackerel.   Limit how much you eat of the following:  ? Canned or prepackaged foods.  ? Food that is high in trans fat, such as fried foods.  ? Food that is high in saturated fat, such as fatty meat.  ? Sweets, desserts, sugary drinks, and other foods with added sugar.  ? Full-fat dairy products.   Do not salt foods before eating.   Try to eat at least 2 vegetarian meals each week.   Eat more home-cooked food and less restaurant, buffet, and fast food.     When eating at a restaurant, ask that your food be prepared with less salt or no salt, if possible.  What foods are recommended?  The items listed may not be a complete list. Talk with your dietitian about what dietary choices are best for you.  Grains  Whole-grain or whole-wheat bread. Whole-grain or whole-wheat pasta. Brown rice. Oatmeal. Quinoa. Bulgur. Whole-grain and low-sodium cereals. Pita bread. Low-fat, low-sodium crackers. Whole-wheat flour tortillas.  Vegetables  Fresh or frozen vegetables (raw, steamed, roasted, or grilled). Low-sodium or reduced-sodium tomato and vegetable juice. Low-sodium or reduced-sodium tomato sauce and tomato paste. Low-sodium or reduced-sodium canned vegetables.  Fruits  All fresh, dried, or frozen fruit. Canned fruit in natural juice (without  added sugar).  Meat and other protein foods  Skinless chicken or turkey. Ground chicken or turkey. Pork with fat trimmed off. Fish and seafood. Egg whites. Dried beans, peas, or lentils. Unsalted nuts, nut butters, and seeds. Unsalted canned beans. Lean cuts of beef with fat trimmed off. Low-sodium, lean deli meat.  Dairy  Low-fat (1%) or fat-free (skim) milk. Fat-free, low-fat, or reduced-fat cheeses. Nonfat, low-sodium ricotta or cottage cheese. Low-fat or nonfat yogurt. Low-fat, low-sodium cheese.  Fats and oils  Soft margarine without trans fats. Vegetable oil. Low-fat, reduced-fat, or light mayonnaise and salad dressings (reduced-sodium). Canola, safflower, olive, soybean, and sunflower oils. Avocado.  Seasoning and other foods  Herbs. Spices. Seasoning mixes without salt. Unsalted popcorn and pretzels. Fat-free sweets.  What foods are not recommended?  The items listed may not be a complete list. Talk with your dietitian about what dietary choices are best for you.  Grains  Baked goods made with fat, such as croissants, muffins, or some breads. Dry pasta or rice meal packs.  Vegetables  Creamed or fried vegetables. Vegetables in a cheese sauce. Regular canned vegetables (not low-sodium or reduced-sodium). Regular canned tomato sauce and paste (not low-sodium or reduced-sodium). Regular tomato and vegetable juice (not low-sodium or reduced-sodium). Pickles. Olives.  Fruits  Canned fruit in a light or heavy syrup. Fried fruit. Fruit in cream or butter sauce.  Meat and other protein foods  Fatty cuts of meat. Ribs. Fried meat. Bacon. Sausage. Bologna and other processed lunch meats. Salami. Fatback. Hotdogs. Bratwurst. Salted nuts and seeds. Canned beans with added salt. Canned or smoked fish. Whole eggs or egg yolks. Chicken or turkey with skin.  Dairy  Whole or 2% milk, cream, and half-and-half. Whole or full-fat cream cheese. Whole-fat or sweetened yogurt. Full-fat cheese. Nondairy creamers. Whipped toppings.  Processed cheese and cheese spreads.  Fats and oils  Butter. Stick margarine. Lard. Shortening. Ghee. Bacon fat. Tropical oils, such as coconut, palm kernel, or palm oil.  Seasoning and other foods  Salted popcorn and pretzels. Onion salt, garlic salt, seasoned salt, table salt, and sea salt. Worcestershire sauce. Tartar sauce. Barbecue sauce. Teriyaki sauce. Soy sauce, including reduced-sodium. Steak sauce. Canned and packaged gravies. Fish sauce. Oyster sauce. Cocktail sauce. Horseradish that you find on the shelf. Ketchup. Mustard. Meat flavorings and tenderizers. Bouillon cubes. Hot sauce and Tabasco sauce. Premade or packaged marinades. Premade or packaged taco seasonings. Relishes. Regular salad dressings.  Where to find more information:   National Heart, Lung, and Blood Institute: www.nhlbi.nih.gov   American Heart Association: www.heart.org  Summary   The DASH eating plan is a healthy eating plan that has been shown to reduce high blood pressure (hypertension). It may also reduce your risk for type 2 diabetes, heart disease, and stroke.   With the   DASH eating plan, you should limit salt (sodium) intake to 2,300 mg a day. If you have hypertension, you may need to reduce your sodium intake to 1,500 mg a day.   When on the DASH eating plan, aim to eat more fresh fruits and vegetables, whole grains, lean proteins, low-fat dairy, and heart-healthy fats.   Work with your health care provider or diet and nutrition specialist (dietitian) to adjust your eating plan to your individual calorie needs.  This information is not intended to replace advice given to you by your health care provider. Make sure you discuss any questions you have with your health care provider.  Document Released: 09/14/2011 Document Revised: 09/18/2016 Document Reviewed: 09/18/2016  Elsevier Interactive Patient Education  2019 [employer]

## 2019-02-12 NOTE — Assessment & Plan Note (Signed)
Chronic, ongoing.  Continue current medication regimen.  Lipid panel next visit. 

## 2019-02-12 NOTE — Progress Notes (Signed)
BP 138/76   Pulse 76   Ht 5\' 9"  (1.753 m)   Wt 198 lb (89.8 kg)   BMI 29.24 kg/m    Subjective:    Patient ID: William Murray, male    DOB: 02/27/1951, 68 y.o.   MRN: [id number]  HPI: William Murray is a 68 y.o. male  Chief Complaint  Patient presents with  . Hypertension    f/u  . Hyperlipidemia    . This visit was completed via telephone due to the restrictions of the COVID-19 pandemic. All issues as above were discussed and addressed but no physical exam was performed. If it was felt that the patient should be evaluated in the office, they were directed there. The patient verbally consented to this visit. Patient was unable to complete an audio/visual visit due to Lack of equipment. Due to the catastrophic nature of the COVID-19 pandemic, this visit was done through audio contact only. . Location of the patient: home . Location of the provider: work . Those involved with this call:  . Provider: Aura DialsJolene Aiyanna Awtrey, DNP . CMA: Elton SinAnita Quito, CMA . Front Desk/Registration: Harriet PhoJoliza Johnson  . Time spent on call: 15 minutes on the phone discussing health concerns. 5 minutes total spent in review of patient's record and preparation of their chart. I verified patient identity using two factors (patient name and date of birth). Patient consents verbally to being seen via telemedicine visit today.   HYPERTENSION / HYPERLIPIDEMIA Currently continues on ASA, Lipitor, Losartan-HCTZ (50-12.5).  November 2019 LDL 55, TCHOL 136, TRIG 242.   Satisfied with current treatment? yes Duration of hypertension: chronic BP monitoring frequency: weekly BP range: 130/70's at baseline, occasional higher readings 150/80's BP medication side effects: no Duration of hyperlipidemia: chronic Cholesterol medication side effects: no Cholesterol supplements: none Medication compliance: good compliance Aspirin: yes Recent stressors: no Recurrent headaches: no Visual changes: no Palpitations: no Dyspnea:  no Chest pain: no Lower extremity edema: no Dizzy/lightheaded: no   ELEVATED TSH: November 2019 TSH 5.300.  Will repeat this at next face to face visit. No current medications.  TSH prior to that one was 3.440. Fatigue: no Cold intolerance: no Heat intolerance: no Weight gain: no Weight loss: no Constipation: no Diarrhea/loose stools: no Palpitations: no Lower extremity edema: no Anxiety/depressed mood: no  Relevant past medical, surgical, family and social history reviewed and updated as indicated. Interim medical history since our last visit reviewed. Allergies and medications reviewed and updated.  Review of Systems  Constitutional: Negative for activity change, diaphoresis, fatigue and fever.  Respiratory: Negative for cough, chest tightness, shortness of breath and wheezing.   Cardiovascular: Negative for chest pain, palpitations and leg swelling.  Gastrointestinal: Negative for abdominal distention, abdominal pain, constipation, diarrhea, nausea and vomiting.  Endocrine: Negative for cold intolerance and heat intolerance.  Musculoskeletal: Negative.   Skin: Negative.   Neurological: Negative for dizziness, syncope, weakness, light-headedness, numbness and headaches.  Psychiatric/Behavioral: Negative.     Per HPI unless specifically indicated above     Objective:    BP 138/76   Pulse 76   Ht 5\' 9"  (1.753 m)   Wt 198 lb (89.8 kg)   BMI 29.24 kg/m   Wt Readings from Last 3 Encounters:  02/12/19 198 lb (89.8 kg)  08/09/18 209 lb 12.8 oz (95.2 kg)  07/12/18 206 lb 9.6 oz (93.7 kg)    Physical Exam  Unable to perform due to telephone visit only.  Results for orders placed or performed in  visit on 08/09/18  Comprehensive metabolic panel  Result Value Ref Range   Glucose 127 (H) 65 - 99 mg/dL   BUN 14 8 - 27 mg/dL   Creatinine, Ser 4.78 0.76 - 1.27 mg/dL   GFR calc non Af Amer 65 >59 mL/min/1.73   GFR calc Af Amer 75 >59 mL/min/1.73   BUN/Creatinine Ratio 12  10 - 24   Sodium 139 134 - 144 mmol/L   Potassium 3.8 3.5 - 5.2 mmol/L   Chloride 101 96 - 106 mmol/L   CO2 24 20 - 29 mmol/L   Calcium 9.9 8.6 - 10.2 mg/dL   Total Protein 7.6 6.0 - 8.5 g/dL   Albumin 4.5 3.6 - 4.8 g/dL   Globulin, Total 3.1 1.5 - 4.5 g/dL   Albumin/Globulin Ratio 1.5 1.2 - 2.2   Bilirubin Total 0.6 0.0 - 1.2 mg/dL   Alkaline Phosphatase 92 39 - 117 IU/L   AST 24 0 - 40 IU/L   ALT 12 0 - 44 IU/L  CBC with Differential/Platelet  Result Value Ref Range   WBC 8.0 3.4 - 10.8 x10E3/uL   RBC 5.16 4.14 - 5.80 x10E6/uL   Hemoglobin 16.9 13.0 - 17.7 g/dL   Hematocrit 29.5 62.1 - 51.0 %   MCV 92 79 - 97 fL   MCH 32.8 26.6 - 33.0 pg   MCHC 35.7 31.5 - 35.7 g/dL   RDW 30.8 65.7 - 84.6 %   Platelets 219 150 - 450 x10E3/uL   Neutrophils 57 Not Estab. %   Lymphs 34 Not Estab. %   Monocytes 7 Not Estab. %   Eos 1 Not Estab. %   Basos 1 Not Estab. %   Neutrophils Absolute 4.6 1.4 - 7.0 x10E3/uL   Lymphocytes Absolute 2.7 0.7 - 3.1 x10E3/uL   Monocytes Absolute 0.5 0.1 - 0.9 x10E3/uL   EOS (ABSOLUTE) 0.1 0.0 - 0.4 x10E3/uL   Basophils Absolute 0.0 0.0 - 0.2 x10E3/uL   Immature Granulocytes 0 Not Estab. %   Immature Grans (Abs) 0.0 0.0 - 0.1 x10E3/uL  TSH  Result Value Ref Range   TSH 5.300 (H) 0.450 - 4.500 uIU/mL  Lipid Panel w/o Chol/HDL Ratio  Result Value Ref Range   Cholesterol, Total 136 100 - 199 mg/dL   Triglycerides 962 (H) 0 - 149 mg/dL   HDL 33 (L) >95 mg/dL   VLDL Cholesterol Cal 48 (H) 5 - 40 mg/dL   LDL Calculated 55 0 - 99 mg/dL  HgB M8U  Result Value Ref Range   Hgb A1c MFr Bld 5.5 4.8 - 5.6 %   Est. average glucose Bld gHb Est-mCnc 111 mg/dL  Uric acid  Result Value Ref Range   Uric Acid 6.0 3.7 - 8.6 mg/dL  PSA  Result Value Ref Range   Prostate Specific Ag, Serum 1.1 0.0 - 4.0 ng/mL      Assessment & Plan:   Problem List Items Addressed This Visit      Cardiovascular and Mediastinum   Essential hypertension, benign - Primary     Chronic, ongoing.  BP at goal today, has occasional elevations at home.  Continue current medication regimen and adjust as needed if frequent elevations noted.  Return in two months for labs and face to face visit.      Relevant Medications   losartan-hydrochlorothiazide (HYZAAR) 50-12.5 MG tablet   atorvastatin (LIPITOR) 10 MG tablet     Other   Hypercholesteremia    Chronic, ongoing.  Continue current medication regimen.  Lipid panel next visit.      Relevant Medications   losartan-hydrochlorothiazide (HYZAAR) 50-12.5 MG tablet   atorvastatin (LIPITOR) 10 MG tablet   Elevated TSH    Noted on November labs.  Repeat TSH next visit.         I discussed the assessment and treatment plan with the patient. The patient was provided an opportunity to ask questions and all were answered. The patient agreed with the plan and demonstrated an understanding of the instructions.   The patient was advised to call back or seek an in-person evaluation if the symptoms worsen or if the condition fails to improve as anticipated.   I provided 15 minutes of time during this encounter.  Follow up plan: Return in about 2 months (around 04/14/2019) for HTN/HLD, elevated TSH.

## 2019-02-12 NOTE — Assessment & Plan Note (Signed)
Noted on November labs.  Repeat TSH next visit.

## 2019-02-12 NOTE — Assessment & Plan Note (Signed)
Chronic, ongoing.  BP at goal today, has occasional elevations at home.  Continue current medication regimen and adjust as needed if frequent elevations noted.  Return in two months for labs and face to face visit.

## 2019-04-04 ENCOUNTER — Telehealth: Payer: Self-pay

## 2019-04-04 NOTE — Telephone Encounter (Signed)
Call pt regarding lung screening. PT is a former smoker. Pt states scan can me any day any time. Pt denies any new health issue.

## 2019-04-07 ENCOUNTER — Telehealth: Payer: Self-pay | Admitting: *Deleted

## 2019-04-07 DIAGNOSIS — Z122 Encounter for screening for malignant neoplasm of respiratory organs: Secondary | ICD-10-CM

## 2019-04-07 DIAGNOSIS — Z87891 Personal history of nicotine dependence: Secondary | ICD-10-CM

## 2019-04-07 NOTE — Telephone Encounter (Signed)
Patient has been notified that annual lung cancer screening low dose CT scan is due currently or will be in near future. Confirmed that patient is within the age range of 55-77, and asymptomatic, (no signs or symptoms of lung cancer). Patient denies illness that would prevent curative treatment for lung cancer if found. Verified smoking history, (former, quit <1 year, 124 pack year). The shared decision making visit was done 03/19/18. Patient is agreeable for CT scan being scheduled.

## 2019-04-10 ENCOUNTER — Other Ambulatory Visit: Payer: Self-pay

## 2019-04-10 ENCOUNTER — Ambulatory Visit
Admission: RE | Admit: 2019-04-10 | Discharge: 2019-04-10 | Disposition: A | Discharge status: Home / Self Care | Payer: Medicare Other | Source: Ambulatory Visit | Attending: Nurse Practitioner | Admitting: Nurse Practitioner

## 2019-04-10 DIAGNOSIS — Z87891 Personal history of nicotine dependence: Secondary | ICD-10-CM | POA: Diagnosis present

## 2019-04-10 DIAGNOSIS — Z122 Encounter for screening for malignant neoplasm of respiratory organs: Secondary | ICD-10-CM | POA: Diagnosis present

## 2019-04-14 ENCOUNTER — Encounter: Payer: Self-pay | Admitting: *Deleted

## 2019-04-25 ENCOUNTER — Other Ambulatory Visit: Payer: Self-pay

## 2019-04-25 NOTE — Patient Outreach (Signed)
[facility]) Care Management  04/25/2019  William Murray [birth year] [id number]   Medication Adherence call to [name] HIPPA Compliant Voice message left with a call back number. Mr. Klumpp is showing past due on Losartan/Hctz 50/12.5 mg under [place].   [facility] Direct Dial [phone]  Fax [phone] [email or web address]

## 2019-05-14 ENCOUNTER — Ambulatory Visit (INDEPENDENT_AMBULATORY_CARE_PROVIDER_SITE_OTHER): Payer: Medicare Other

## 2019-05-14 VITALS — BP 148/88 | HR 61 | Ht 69.0 in | Wt 200.0 lb

## 2019-05-14 DIAGNOSIS — Z0000 Encounter for general adult medical examination without abnormal findings: Secondary | ICD-10-CM

## 2019-05-14 NOTE — Progress Notes (Signed)
Subjective:   William Murray is a 68 y.o. male who presents for Medicare Annual/Subsequent preventive examination.  This visit is being conducted via phone call  - after an attmept to do on video chat - due to the COVID-19 pandemic. This patient has given me verbal consent via phone to conduct this visit, patient states they are participating from their home address. Some vital signs may be absent or patient reported.   Patient identification: identified by name, DOB, and current address.    Review of Systems:  Cardiac Risk Factors include: advanced age (>6855men, 49>65 women);male gender;hypertension;dyslipidemia;smoking/ tobacco exposure     Objective:    Vitals: BP (!) 148/88 Comment: pt reported  Pulse 61 Comment: pt reported  Ht 5\' 9"  (1.753 m) Comment: pt reported  Wt 200 lb (90.7 kg) Comment: pt reported  BMI 29.53 kg/m   Body mass index is 29.53 kg/m.  Advanced Directives 05/14/2019 05/08/2018 05/04/2017 01/16/2017 04/28/2015 04/28/2015  Does Patient Have a Medical Advance Directive? Yes No No No No No  Type of Advance Directive Living will;Healthcare Power of Attorney - - - - -  Copy of Healthcare Power of Attorney in Chart? Yes - validated most recent copy scanned in chart (See row information) - - - - -  Would patient like information on creating a medical advance directive? - Yes (MAU/Ambulatory/Procedural Areas - Information given) Yes (MAU/Ambulatory/Procedural Areas - Information given) - Yes - Educational materials given Yes - Transport plannerducational materials given    Tobacco Social History   Tobacco Use  Smoking Status Former Smoker  . Packs/day: 0.25  . Years: 62.00  . Pack years: 15.50  . Types: Cigarettes  . Quit date: 01/07/2018  . Years since quitting: 1.3  Smokeless Tobacco Never Used     Counseling given: Not Answered   Clinical Intake:                       Past Medical History:  Diagnosis Date  . Anginal pain (HCC)   . Arthritis   . COPD  (chronic obstructive pulmonary disease) (HCC)   . Erectile disorder due to medical condition in male   . Gout   . Hyperlipidemia   . Hypertension   . Shortness of breath dyspnea    Past Surgical History:  Procedure Laterality Date  . arm surgery Left   . CARDIAC CATHETERIZATION    . TONSILLECTOMY     Family History  Problem Relation Age of Onset  . Renal Disease Mother   . Diabetes Mother   . Dementia Father   . COPD Father   . Emphysema Father   . Cervical cancer Sister   . Diabetes Sister   . Bladder Cancer Brother   . Hypertension Son   . Heart disease Son   . Heart disease Maternal Grandmother   . Heart disease Maternal Grandfather   . Heart disease Paternal Grandmother   . Heart disease Paternal Grandfather   . Aneurysm Sister   . Hypertension Son    Social History   Socioeconomic History  . Marital status: Married    Spouse name: Not on file  . Number of children: Not on file  . Years of education: Not on file  . Highest education level: 9th grade  Occupational History  . Occupation: retired  Engineer, productionocial Needs  . Financial resource strain: Not hard at all  . Food insecurity    Worry: Never true    Inability: Never true  .  Transportation needs    Medical: No    Non-medical: No  Tobacco Use  . Smoking status: Former Smoker    Packs/day: 0.25    Years: 62.00    Pack years: 15.50    Types: Cigarettes    Quit date: 01/07/2018    Years since quitting: 1.3  . Smokeless tobacco: Never Used  Substance and Sexual Activity  . Alcohol use: Yes    Alcohol/week: 3.0 standard drinks    Types: 2 Shots of liquor, 1 Cans of beer per week    Comment: 2 shots of liquor  a week, one beer occasionally   . Drug use: No  . Sexual activity: Not Currently  Lifestyle  . Physical activity    Days per week: 0 days    Minutes per session: 0 min  . Stress: Not at all  Relationships  . Social Musicianconnections    Talks on phone: Once a week    Gets together: Once a week    Attends  religious service: Never    Active member of club or organization: No    Attends meetings of clubs or organizations: Never    Relationship status: Married  Other Topics Concern  . Not on file  Social History Narrative  . Not on file    Outpatient Encounter Medications as of 05/14/2019  Medication Sig  . albuterol (PROVENTIL HFA;VENTOLIN HFA) 108 (90 Base) MCG/ACT inhaler Inhale 2 puffs into the lungs every 6 (six) hours as needed for wheezing or shortness of breath.  Marland Kitchen. aspirin 81 MG tablet Take 1 tablet (81 mg total) by mouth daily.  Marland Kitchen. atorvastatin (LIPITOR) 10 MG tablet TAKE 1 TABLET BY MOUTH EVERY DAY IN THE EVENING  . losartan-hydrochlorothiazide (HYZAAR) 50-12.5 MG tablet Take 1 tablet by mouth daily. (Patient not taking: Reported on 05/14/2019)   No facility-administered encounter medications on file as of 05/14/2019.     Activities of Daily Living In your present state of health, do you have any difficulty performing the following activities: 05/14/2019  Hearing? Y  Comment some with pitch of voice, no hearing aids  Vision? N  Comment eyeglasses  Difficulty concentrating or making decisions? N  Walking or climbing stairs? N  Dressing or bathing? N  Doing errands, shopping? N  Preparing Food and eating ? N  Using the Toilet? N  In the past six months, have you accidently leaked urine? N  Do you have problems with loss of bowel control? N  Managing your Medications? N  Managing your Finances? N  Housekeeping or managing your Housekeeping? N  Some recent data might be hidden    Patient Care Team: Marjie Skiffannady, Jolene T, NP as PCP - General (Nurse Practitioner)   Assessment:   This is a routine wellness examination for William Murray.  Exercise Activities and Dietary recommendations Current Exercise Habits: The patient does not participate in regular exercise at present, Exercise limited by: None identified  Goals    . DIET - INCREASE WATER INTAKE     Recommend drinking at least 6-8  glasses of water a day     . Quit smoking / using tobacco    . Quit smoking / using tobacco     Smoking cessation discussed       Fall Risk: Fall Risk  05/14/2019 05/08/2018 02/20/2018 05/04/2017  Falls in the past year? 0 No No No    FALL RISK PREVENTION PERTAINING TO THE HOME:  Any stairs in or around the home? Yes  If  so, are there any without handrails? No   Home free of loose throw rugs in walkways, pet beds, electrical cords, etc? Yes  Adequate lighting in your home to reduce risk of falls? Yes   ASSISTIVE DEVICES UTILIZED TO PREVENT FALLS:  Life alert? No  Use of a cane, walker or w/c? No  Grab bars in the bathroom? No  Shower chair or bench in shower? No  Elevated toilet seat or a handicapped toilet? No   TIMED UP AND GO:  Unable to perform   Depression Screen PHQ 2/9 Scores 05/14/2019 05/08/2018 02/20/2018 05/04/2017  PHQ - 2 Score 0 0 0 0    Cognitive Function     6CIT Screen 05/08/2018 05/04/2017  What Year? 0 points 0 points  What month? 0 points 0 points  What time? 0 points 0 points  Count back from 20 0 points 0 points  Months in reverse 0 points 0 points  Repeat phrase 0 points 0 points  Total Score 0 0    Immunization History  Administered Date(s) Administered  . Influenza, High Dose Seasonal PF 08/21/2017, 06/28/2018  . Pneumococcal Conjugate-13 10/19/2013  . Pneumococcal Polysaccharide-23 10/19/2014  . Td 12/15/2009  . Zoster 10/19/2014    Qualifies for Shingles Vaccine? Yes  Zostavax completed 10/19/2014. Due for Shingrix. Education has been provided regarding the importance of this vaccine. Pt has been advised to call insurance company to determine out of pocket expense. Advised may also receive vaccine at local pharmacy or Health Dept. Verbalized acceptance and understanding.  Tdap:up to date   Flu Vaccine: due 06/2019  Pneumococcal Vaccine: up to date   Screening Tests Health Maintenance  Topic Date Due  . INFLUENZA VACCINE  05/10/2019   . TETANUS/TDAP  12/16/2019  . Fecal DNA (Cologuard)  05/12/2021  . Hepatitis C Screening  Completed  . PNA vac Low Risk Adult  Completed   Cancer Screenings:  Colorectal Screening: Completed cologuard 05/12/2018  Lung Cancer Screening: (Low Dose CT Chest recommended if Age 32-80 years, 30 pack-year currently smoking OR have quit w/in 15years.) does not qualify.     Additional Screening:  Hepatitis C Screening: does qualify; Completed 04/17/2017  Vision Screening: Recommended annual ophthalmology exams for early detection of glaucoma and other disorders of the eye. Is the patient up to date with their annual eye exam?  No   If pt is not established with a provider, would they like to be referred to a provider to establish care? Yes . Discussed Ophthalmologist in area  Dental Screening: Recommended annual dental exams for proper oral hygiene  Community Resource Referral:  CRR required this visit?  No        Plan:  I have personally reviewed and addressed the Medicare Annual Wellness questionnaire and have noted the following in the patient's chart:  A. Medical and social history B. Use of alcohol, tobacco or illicit drugs  C. Current medications and supplements D. Functional ability and status E.  Nutritional status F.  Physical activity G. Advance directives H. List of other physicians I.  Hospitalizations, surgeries, and ER visits in previous 12 months J.  Vitals K. Screenings such as hearing and vision if needed, cognitive and depression L. Referrals and appointments   In addition, I have reviewed and discussed with patient certain preventive protocols, quality metrics, and best practice recommendations. A written personalized care plan for preventive services as well as general preventive health recommendations were provided to patient.   Signed,   [place], [address]  A, LPN  1/0/3013 Nurse Health Advisor   Nurse Notes: patient states he ran out of losartan/hctz a month  ago. States he doesn't have any refills left at pharmacy.  Also Scheduled in office visit for Monday 05/19/2019, per last note patient to come in for face to face visit for elevated TSH follow up and BP follow up.

## 2019-05-14 NOTE — Patient Instructions (Signed)
William Murray , Thank you for taking time to come for your Medicare Wellness Visit. I appreciate your ongoing commitment to your health goals. Please review the following plan we discussed and let me know if I can assist you in the future.   Screening recommendations/referrals: Colonoscopy: completed cologuard 05/2018, due 2022 Recommended yearly ophthalmology/optometry visit for glaucoma screening and checkup Recommended yearly dental visit for hygiene and checkup  Vaccinations: Influenza vaccine: due 06/2019 Pneumococcal vaccine: up to date Tdap vaccine: up to date Shingles vaccine: shingrix eligible, check with your insurance company for coverage     Advanced directives: copy on file  Conditions/risks identified: Hypertension., ran out of medication early. Informed PCP  Next appointment: Follow up in one year for your annual wellness visit.   Preventive Care 70 Years and Older, Male Preventive care refers to lifestyle choices and visits with your health care provider that can promote health and wellness. What does preventive care include?  A yearly physical exam. This is also called an annual well check.  Dental exams once or twice a year.  Routine eye exams. Ask your health care provider how often you should have your eyes checked.  Personal lifestyle choices, including:  Daily care of your teeth and gums.  Regular physical activity.  Eating a healthy diet.  Avoiding tobacco and drug use.  Limiting alcohol use.  Practicing safe sex.  Taking low doses of aspirin every day.  Taking vitamin and mineral supplements as recommended by your health care provider. What happens during an annual well check? The services and screenings done by your health care provider during your annual well check will depend on your age, overall health, lifestyle risk factors, and family history of disease. Counseling  Your health care provider may ask you questions about your:  Alcohol use.   Tobacco use.  Drug use.  Emotional well-being.  Home and relationship well-being.  Sexual activity.  Eating habits.  History of falls.  Memory and ability to understand (cognition).  Work and work Statistician. Screening  You may have the following tests or measurements:  Height, weight, and BMI.  Blood pressure.  Lipid and cholesterol levels. These may be checked every 5 years, or more frequently if you are over 15 years old.  Skin check.  Lung cancer screening. You may have this screening every year starting at age 70 if you have a 30-pack-year history of smoking and currently smoke or have quit within the past 15 years.  Fecal occult blood test (FOBT) of the stool. You may have this test every year starting at age 70.  Flexible sigmoidoscopy or colonoscopy. You may have a sigmoidoscopy every 5 years or a colonoscopy every 10 years starting at age 40.  Prostate cancer screening. Recommendations will vary depending on your family history and other risks.  Hepatitis C blood test.  Hepatitis B blood test.  Sexually transmitted disease (STD) testing.  Diabetes screening. This is done by checking your blood sugar (glucose) after you have not eaten for a while (fasting). You may have this done every 1-3 years.  Abdominal aortic aneurysm (AAA) screening. You may need this if you are a current or former smoker.  Osteoporosis. You may be screened starting at age 70 if you are at high risk. Talk with your health care provider about your test results, treatment options, and if necessary, the need for more tests. Vaccines  Your health care provider may recommend certain vaccines, such as:  Influenza vaccine. This is recommended every  year.  Tetanus, diphtheria, and acellular pertussis (Tdap, Td) vaccine. You may need a Td booster every 10 years.  Zoster vaccine. You may need this after age 10.  Pneumococcal 13-valent conjugate (PCV13) vaccine. One dose is recommended  after age 61.  Pneumococcal polysaccharide (PPSV23) vaccine. One dose is recommended after age 53. Talk to your health care provider about which screenings and vaccines you need and how often you need them. This information is not intended to replace advice given to you by your health care provider. Make sure you discuss any questions you have with your health care provider. Document Released: 10/22/2015 Document Revised: 06/14/2016 Document Reviewed: 07/27/2015 Elsevier Interactive Patient Education  2017 [place] Prevention in the Home Falls can cause injuries. They can happen to people of all ages. There are many things you can do to make your home safe and to help prevent falls. What can I do on the outside of my home?  Regularly fix the edges of walkways and driveways and fix any cracks.  Remove anything that might make you trip as you walk through a door, such as a raised step or threshold.  Trim any bushes or trees on the path to your home.  Use bright outdoor lighting.  Clear any walking paths of anything that might make someone trip, such as rocks or tools.  Regularly check to see if handrails are loose or broken. Make sure that both sides of any steps have handrails.  Any raised decks and porches should have guardrails on the edges.  Have any leaves, snow, or ice cleared regularly.  Use sand or salt on walking paths during winter.  Clean up any spills in your garage right away. This includes oil or grease spills. What can I do in the bathroom?  Use night lights.  Install grab bars by the toilet and in the tub and shower. Do not use towel bars as grab bars.  Use non-skid mats or decals in the tub or shower.  If you need to sit down in the shower, use a plastic, non-slip stool.  Keep the floor dry. Clean up any water that spills on the floor as soon as it happens.  Remove soap buildup in the tub or shower regularly.  Attach bath mats securely with  double-sided non-slip rug tape.  Do not have throw rugs and other things on the floor that can make you trip. What can I do in the bedroom?  Use night lights.  Make sure that you have a light by your bed that is easy to reach.  Do not use any sheets or blankets that are too big for your bed. They should not hang down onto the floor.  Have a firm chair that has side arms. You can use this for support while you get dressed.  Do not have throw rugs and other things on the floor that can make you trip. What can I do in the kitchen?  Clean up any spills right away.  Avoid walking on wet floors.  Keep items that you use a lot in easy-to-reach places.  If you need to reach something above you, use a strong step stool that has a grab bar.  Keep electrical cords out of the way.  Do not use floor polish or wax that makes floors slippery. If you must use wax, use non-skid floor wax.  Do not have throw rugs and other things on the floor that can make you trip. What can  I do with my stairs?  Do not leave any items on the stairs.  Make sure that there are handrails on both sides of the stairs and use them. Fix handrails that are broken or loose. Make sure that handrails are as long as the stairways.  Check any carpeting to make sure that it is firmly attached to the stairs. Fix any carpet that is loose or worn.  Avoid having throw rugs at the top or bottom of the stairs. If you do have throw rugs, attach them to the floor with carpet tape.  Make sure that you have a light switch at the top of the stairs and the bottom of the stairs. If you do not have them, ask someone to add them for you. What else can I do to help prevent falls?  Wear shoes that:  Do not have high heels.  Have rubber bottoms.  Are comfortable and fit you well.  Are closed at the toe. Do not wear sandals.  If you use a stepladder:  Make sure that it is fully opened. Do not climb a closed stepladder.  Make  sure that both sides of the stepladder are locked into place.  Ask someone to hold it for you, if possible.  Clearly mark and make sure that you can see:  Any grab bars or handrails.  First and last steps.  Where the edge of each step is.  Use tools that help you move around (mobility aids) if they are needed. These include:  Canes.  Walkers.  Scooters.  Crutches.  Turn on the lights when you go into a dark area. Replace any light bulbs as soon as they burn out.  Set up your furniture so you have a clear path. Avoid moving your furniture around.  If any of your floors are uneven, fix them.  If there are any pets around you, be aware of where they are.  Review your medicines with your doctor. Some medicines can make you feel dizzy. This can increase your chance of falling. Ask your doctor what other things that you can do to help prevent falls. This information is not intended to replace advice given to you by your health care provider. Make sure you discuss any questions you have with your health care provider. Document Released: 07/22/2009 Document Revised: 03/02/2016 Document Reviewed: 10/30/2014 Elsevier Interactive Patient Education  2017 [employer].

## 2019-05-15 ENCOUNTER — Other Ambulatory Visit: Payer: Self-pay

## 2019-05-15 NOTE — Patient Outreach (Signed)
[facility]) Care Management  05/15/2019  William Murray 07-14-1951 [id number]   Medication Adherence call to [name] Telephone call to Patient regarding Medication Adherence unable to reach patient. [name] is showing past due on Losartan/Hctz 50/12.5 mg under [place].   [facility] Management Direct Dial [phone]  Fax [phone] [email or web address]

## 2019-05-19 ENCOUNTER — Other Ambulatory Visit: Payer: Self-pay

## 2019-05-19 ENCOUNTER — Encounter: Payer: Self-pay | Admitting: Nurse Practitioner

## 2019-05-19 ENCOUNTER — Ambulatory Visit (INDEPENDENT_AMBULATORY_CARE_PROVIDER_SITE_OTHER): Payer: Medicare Other | Admitting: Nurse Practitioner

## 2019-05-19 VITALS — BP 148/80 | HR 63 | Temp 98.2°F | Ht 69.0 in | Wt 201.0 lb

## 2019-05-19 DIAGNOSIS — E7800 Pure hypercholesterolemia, unspecified: Secondary | ICD-10-CM

## 2019-05-19 DIAGNOSIS — R7989 Other specified abnormal findings of blood chemistry: Secondary | ICD-10-CM

## 2019-05-19 DIAGNOSIS — I10 Essential (primary) hypertension: Secondary | ICD-10-CM | POA: Diagnosis not present

## 2019-05-19 DIAGNOSIS — K089 Disorder of teeth and supporting structures, unspecified: Secondary | ICD-10-CM

## 2019-05-19 DIAGNOSIS — J449 Chronic obstructive pulmonary disease, unspecified: Secondary | ICD-10-CM | POA: Diagnosis not present

## 2019-05-19 MED ORDER — LOSARTAN POTASSIUM-HCTZ 50-12.5 MG PO TABS: 1.0000 | ORAL_TABLET | Freq: Every day | ORAL | 2 refills | Status: DC

## 2019-05-19 NOTE — Progress Notes (Signed)
BP (!) 148/80 (BP Location: Left Arm, Patient Position: Sitting)   Pulse 63   Temp 98.2 F (36.8 C) (Oral)   Ht 5\' 9"  (1.753 m)   Wt 201 lb (91.2 kg)   SpO2 96%   BMI 29.68 kg/m    Subjective:    Patient ID: William Murray, male    DOB: 16-Mar-1951, 68 y.o.   MRN: [id number]  HPI: William Murray is a 68 y.o. male  Chief Complaint  Patient presents with  . Hypertension    pt states he has been taking the Losartan-HCTZ for about a month now  . Hyperlipidemia   HYPERTENSION / HYPERLIPIDEMIA Has not taken blood pressure medication in one month. Did not pick up from pharmacy, Losartan-HCTZ.  Takes Lipitor for HLD.  No recent gout flares and denies pain today.  Discussed monitoring closely with use of HCTZ. Satisfied with current treatment? yes Duration of hypertension: chronic BP monitoring frequency: a few times a week BP range: 132/76 (in this range when taking medications), occasionally higher SBP 140  BP medication side effects: no Duration of hyperlipidemia: chronic Cholesterol medication side effects: no Cholesterol supplements: none Medication compliance: good compliance Aspirin: yes Recent stressors: no Recurrent headaches: no Visual changes: no Palpitations: no Dyspnea: no Chest pain: no Lower extremity edema: no Dizzy/lightheaded: no   COPD Quit smoking one year ago.  No current maintenance inhaler regimen.  Denies issues with SOB, including with activity.   COPD status: stable Satisfied with current treatment?: yes Oxygen use: no Dyspnea frequency: none Cough frequency: none Rescue inhaler frequency: has used in a long time Limitation of activity: no Productive cough: none Last Spirometry: 2018 Pneumovax: Up to Date Influenza: Up to Date   ELEVATED TSH: Last TSH level 5.300.  He denies any symptoms and no current medications, Fatigue: no Cold intolerance: no Heat intolerance: no Weight gain: no Weight loss: no Constipation: no Diarrhea/loose  stools: no Palpitations: no Lower extremity edema: no Anxiety/depressed mood: no   REFERRAL DENTIST: Wishes referral to dentist for exam and discussion of pulling teeth and getting dentures.  Has not been seen in several years.  Relevant past medical, surgical, family and social history reviewed and updated as indicated. Interim medical history since our last visit reviewed. Allergies and medications reviewed and updated.  Review of Systems  Constitutional: Negative for activity change, diaphoresis, fatigue and fever.  Respiratory: Negative for cough, chest tightness, shortness of breath and wheezing.   Cardiovascular: Negative for chest pain, palpitations and leg swelling.  Gastrointestinal: Negative for abdominal distention, abdominal pain, constipation, diarrhea, nausea and vomiting.  Endocrine: Negative for cold intolerance and heat intolerance.  Neurological: Negative for dizziness, syncope, weakness, light-headedness, numbness and headaches.  Psychiatric/Behavioral: Negative.     Per HPI unless specifically indicated above     Objective:    BP (!) 148/80 (BP Location: Left Arm, Patient Position: Sitting)   Pulse 63   Temp 98.2 F (36.8 C) (Oral)   Ht 5\' 9"  (1.753 m)   Wt 201 lb (91.2 kg)   SpO2 96%   BMI 29.68 kg/m   Wt Readings from Last 3 Encounters:  05/19/19 201 lb (91.2 kg)  05/14/19 200 lb (90.7 kg)  04/10/19 200 lb (90.7 kg)    Physical Exam Vitals signs and nursing note reviewed.  Constitutional:      General: He is awake. He is not in acute distress.    Appearance: He is well-developed. He is not ill-appearing.  HENT:  Head: Normocephalic and atraumatic.     Right Ear: Hearing normal. No drainage.     Left Ear: Hearing normal. No drainage.     Mouth/Throat:     Dentition: Abnormal dentition (poor dentition, multiple broken teeth).     Pharynx: Uvula midline.  Eyes:     General: Lids are normal.        Right eye: No discharge.        Left eye:  No discharge.     Conjunctiva/sclera: Conjunctivae normal.     Pupils: Pupils are equal, round, and reactive to light.  Neck:     Musculoskeletal: Normal range of motion and neck supple.     Thyroid: No thyromegaly.     Vascular: No carotid bruit.     Trachea: Trachea normal.  Cardiovascular:     Rate and Rhythm: Normal rate and regular rhythm.     Heart sounds: Normal heart sounds, S1 normal and S2 normal. No murmur. No gallop.   Pulmonary:     Effort: Pulmonary effort is normal. No accessory muscle usage or respiratory distress.     Breath sounds: Normal breath sounds.  Abdominal:     General: Bowel sounds are normal.     Palpations: Abdomen is soft. There is no hepatomegaly or splenomegaly.  Musculoskeletal: Normal range of motion.     Right lower leg: No edema.     Left lower leg: No edema.  Skin:    General: Skin is warm and dry.     Capillary Refill: Capillary refill takes less than 2 seconds.     Findings: No rash.  Neurological:     Mental Status: He is alert and oriented to person, place, and time.     Deep Tendon Reflexes: Reflexes are normal and symmetric.  Psychiatric:        Mood and Affect: Mood normal.        Behavior: Behavior normal. Behavior is cooperative.        Thought Content: Thought content normal.        Judgment: Judgment normal.     Results for orders placed or performed in visit on 08/09/18  Comprehensive metabolic panel  Result Value Ref Range   Glucose 127 (H) 65 - 99 mg/dL   BUN 14 8 - 27 mg/dL   Creatinine, Ser 1.611.14 0.76 - 1.27 mg/dL   GFR calc non Af Amer 65 >59 mL/min/1.73   GFR calc Af Amer 75 >59 mL/min/1.73   BUN/Creatinine Ratio 12 10 - 24   Sodium 139 134 - 144 mmol/L   Potassium 3.8 3.5 - 5.2 mmol/L   Chloride 101 96 - 106 mmol/L   CO2 24 20 - 29 mmol/L   Calcium 9.9 8.6 - 10.2 mg/dL   Total Protein 7.6 6.0 - 8.5 g/dL   Albumin 4.5 3.6 - 4.8 g/dL   Globulin, Total 3.1 1.5 - 4.5 g/dL   Albumin/Globulin Ratio 1.5 1.2 - 2.2    Bilirubin Total 0.6 0.0 - 1.2 mg/dL   Alkaline Phosphatase 92 39 - 117 IU/L   AST 24 0 - 40 IU/L   ALT 12 0 - 44 IU/L  CBC with Differential/Platelet  Result Value Ref Range   WBC 8.0 3.4 - 10.8 x10E3/uL   RBC 5.16 4.14 - 5.80 x10E6/uL   Hemoglobin 16.9 13.0 - 17.7 g/dL   Hematocrit 09.647.4 04.537.5 - 51.0 %   MCV 92 79 - 97 fL   MCH 32.8 26.6 - 33.0 pg  MCHC 35.7 31.5 - 35.7 g/dL   RDW 16.113.0 09.612.3 - 04.515.4 %   Platelets 219 150 - 450 x10E3/uL   Neutrophils 57 Not Estab. %   Lymphs 34 Not Estab. %   Monocytes 7 Not Estab. %   Eos 1 Not Estab. %   Basos 1 Not Estab. %   Neutrophils Absolute 4.6 1.4 - 7.0 x10E3/uL   Lymphocytes Absolute 2.7 0.7 - 3.1 x10E3/uL   Monocytes Absolute 0.5 0.1 - 0.9 x10E3/uL   EOS (ABSOLUTE) 0.1 0.0 - 0.4 x10E3/uL   Basophils Absolute 0.0 0.0 - 0.2 x10E3/uL   Immature Granulocytes 0 Not Estab. %   Immature Grans (Abs) 0.0 0.0 - 0.1 x10E3/uL  TSH  Result Value Ref Range   TSH 5.300 (H) 0.450 - 4.500 uIU/mL  Lipid Panel w/o Chol/HDL Ratio  Result Value Ref Range   Cholesterol, Total 136 100 - 199 mg/dL   Triglycerides 409242 (H) 0 - 149 mg/dL   HDL 33 (L) >81>39 mg/dL   VLDL Cholesterol Cal 48 (H) 5 - 40 mg/dL   LDL Calculated 55 0 - 99 mg/dL  HgB X9JA1c  Result Value Ref Range   Hgb A1c MFr Bld 5.5 4.8 - 5.6 %   Est. average glucose Bld gHb Est-mCnc 111 mg/dL  Uric acid  Result Value Ref Range   Uric Acid 6.0 3.7 - 8.6 mg/dL  PSA  Result Value Ref Range   Prostate Specific Ag, Serum 1.1 0.0 - 4.0 ng/mL      Assessment & Plan:   Problem List Items Addressed This Visit      Cardiovascular and Mediastinum   Essential hypertension, benign    Chronic, ongoing with initial BP elevated today and repeat improved but above goal.  Suspect related to no BP medication in one month, prior to this home BP at goal.  Restart current medication regimen.  Labs today.  Monitor HCTZ due to hx gout.  Return in 6 months.      Relevant Medications    losartan-hydrochlorothiazide (HYZAAR) 50-12.5 MG tablet     Respiratory   Stage 1 mild COPD by GOLD classification (HCC) - Primary    Stable with no use of Albuterol.  Continue current regimen.  Spirometry next visit.        Other   Hypercholesteremia    Chronic, ongoing.  Continue current medication regimen and adjust as needed. Labs today.        Relevant Medications   losartan-hydrochlorothiazide (HYZAAR) 50-12.5 MG tablet   Other Relevant Orders   Comprehensive metabolic panel   Lipid Panel w/o Chol/HDL Ratio   Elevated TSH    Recheck TSH today.      Relevant Orders   Thyroid Panel With TSH    Other Visit Diagnoses    Poor dentition       Dental referral   Relevant Orders   Ambulatory referral to Dentistry       Follow up plan: Return in about 6 months (around 11/19/2019) for HTN/HLD, COPD (spirometry need).

## 2019-05-19 NOTE — Assessment & Plan Note (Signed)
Chronic, ongoing.  Continue current medication regimen and adjust as needed.  Labs today. 

## 2019-05-19 NOTE — Assessment & Plan Note (Signed)
Chronic, ongoing with initial BP elevated today and repeat improved but above goal.  Suspect related to no BP medication in one month, prior to this home BP at goal.  Restart current medication regimen.  Labs today.  Monitor HCTZ due to hx gout.  Return in 6 months.

## 2019-05-19 NOTE — Patient Instructions (Signed)
Hypothyroidism  Hypothyroidism is when the thyroid gland does not make enough of certain hormones (it is underactive). The thyroid gland is a small gland located in the lower front part of the neck, just in front of the windpipe (trachea). This gland makes hormones that help control how the body uses food for energy (metabolism) as well as how the heart and brain function. These hormones also play a role in keeping your bones strong. When the thyroid is underactive, it produces too little of the hormones thyroxine (T4) and triiodothyronine (T3). What are the causes? This condition may be caused by:  Hashimoto's disease. This is a disease in which the body's disease-fighting system (immune system) attacks the thyroid gland. This is the most common cause.  Viral infections.  Pregnancy.  Certain medicines.  Birth defects.  Past radiation treatments to the head or neck for cancer.  Past treatment with radioactive iodine.  Past exposure to radiation in the environment.  Past surgical removal of part or all of the thyroid.  Problems with a gland in the center of the brain (pituitary gland).  Lack of enough iodine in the diet. What increases the risk? You are more likely to develop this condition if:  You are male.  You have a family history of thyroid conditions.  You use a medicine called lithium.  You take medicines that affect the immune system (immunosuppressants). What are the signs or symptoms? Symptoms of this condition include:  Feeling as though you have no energy (lethargy).  Not being able to tolerate cold.  Weight gain that is not explained by a change in diet or exercise habits.  Lack of appetite.  Dry skin.  Coarse hair.  Menstrual irregularity.  Slowing of thought processes.  Constipation.  Sadness or depression. How is this diagnosed? This condition may be diagnosed based on:  Your symptoms, your medical history, and a physical exam.  Blood  tests. You may also have imaging tests, such as an ultrasound or MRI. How is this treated? This condition is treated with medicine that replaces the thyroid hormones that your body does not make. After you begin treatment, it may take several weeks for symptoms to go away. Follow these instructions at home:  Take over-the-counter and prescription medicines only as told by your health care provider.  If you start taking any new medicines, tell your health care provider.  Keep all follow-up visits as told by your health care provider. This is important. ? As your condition improves, your dosage of thyroid hormone medicine may change. ? You will need to have blood tests regularly so that your health care provider can monitor your condition. Contact a health care provider if:  Your symptoms do not get better with treatment.  You are taking thyroid replacement medicine and you: ? Sweat a lot. ? Have tremors. ? Feel anxious. ? Lose weight rapidly. ? Cannot tolerate heat. ? Have emotional swings. ? Have diarrhea. ? Feel weak. Get help right away if you have:  Chest pain.  An irregular heartbeat.  A rapid heartbeat.  Difficulty breathing. Summary  Hypothyroidism is when the thyroid gland does not make enough of certain hormones (it is underactive).  When the thyroid is underactive, it produces too little of the hormones thyroxine (T4) and triiodothyronine (T3).  The most common cause is Hashimoto's disease, a disease in which the body's disease-fighting system (immune system) attacks the thyroid gland. The condition can also be caused by viral infections, medicine, pregnancy, or past   radiation treatment to the head or neck.  Symptoms may include weight gain, dry skin, constipation, feeling as though you do not have energy, and not being able to tolerate cold.  This condition is treated with medicine to replace the thyroid hormones that your body does not make. This information  is not intended to replace advice given to you by your health care provider. Make sure you discuss any questions you have with your health care provider. Document Released: 09/25/2005 Document Revised: 09/07/2017 Document Reviewed: 09/05/2017 Elsevier Patient Education  2020 [employer]  

## 2019-05-19 NOTE — Assessment & Plan Note (Signed)
Recheck TSH today.  

## 2019-05-19 NOTE — Assessment & Plan Note (Signed)
Stable with no use of Albuterol.  Continue current regimen.  Spirometry next visit.

## 2019-05-20 ENCOUNTER — Ambulatory Visit: Payer: Self-pay | Admitting: Pharmacist

## 2019-05-20 DIAGNOSIS — Z720 Tobacco use: Secondary | ICD-10-CM

## 2019-05-20 DIAGNOSIS — I10 Essential (primary) hypertension: Secondary | ICD-10-CM

## 2019-05-20 DIAGNOSIS — J449 Chronic obstructive pulmonary disease, unspecified: Secondary | ICD-10-CM

## 2019-05-20 LAB — COMPREHENSIVE METABOLIC PANEL
ALT: 11 IU/L (ref 0–44)
AST: 24 IU/L (ref 0–40)
Albumin/Globulin Ratio: 1.6 (ref 1.2–2.2)
Albumin: 4.5 g/dL (ref 3.8–4.8)
Alkaline Phosphatase: 90 IU/L (ref 39–117)
BUN/Creatinine Ratio: 15 (ref 10–24)
BUN: 14 mg/dL (ref 8–27)
Bilirubin Total: 0.7 mg/dL (ref 0.0–1.2)
CO2: 24 mmol/L (ref 20–29)
Calcium: 10 mg/dL (ref 8.6–10.2)
Chloride: 106 mmol/L (ref 96–106)
Creatinine, Ser: 0.95 mg/dL (ref 0.76–1.27)
GFR calc Af Amer: 94 mL/min/{1.73_m2} (ref >59)
GFR calc non Af Amer: 81 mL/min/{1.73_m2} (ref >59)
Globulin, Total: 2.8 g/dL (ref 1.5–4.5)
Glucose: 103 mg/dL — ABNORMAL HIGH (ref 65–99)
Potassium: 5 mmol/L (ref 3.5–5.2)
Sodium: 143 mmol/L (ref 134–144)
Total Protein: 7.3 g/dL (ref 6.0–8.5)

## 2019-05-20 LAB — LIPID PANEL W/O CHOL/HDL RATIO
Cholesterol, Total: 161 mg/dL (ref 100–199)
HDL: 38 mg/dL — ABNORMAL LOW (ref >39)
LDL Calculated: 84 mg/dL (ref 0–99)
Triglycerides: 193 mg/dL — ABNORMAL HIGH (ref 0–149)
VLDL Cholesterol Cal: 39 mg/dL (ref 5–40)

## 2019-05-20 LAB — THYROID PANEL WITH TSH
Free Thyroxine Index: 2.1 (ref 1.2–4.9)
T3 Uptake Ratio: 31 % (ref 24–39)
T4, Total: 6.8 ug/dL (ref 4.5–12.0)
TSH: 4.32 u[IU]/mL (ref 0.450–4.500)

## 2019-05-20 NOTE — Chronic Care Management (AMB) (Signed)
  Chronic Care Management   Note  05/20/2019 Name: William Murray MRN: [id number] DOB: [birth year]  [name] is a 68 y.o. year old male who is a primary care patient of Cannady, Barbaraann Faster, NP. The CCM team was consulted for assistance with chronic disease management and care coordination needs.    Received referral from patient's insurance plan due to being <80% adherence to antihypertensive therapy though pharmacy claims. Discussed with PCP Marnee Guarneri, NP; recommended CCM referral be placed. I have done so.   Follow up plan: - Will outreach patient in the next 3-4 weeks for medication management support  Catie Darnelle Maffucci, PharmD Clinical Pharmacist [place] [phone]

## 2019-05-21 DIAGNOSIS — H25813 Combined forms of age-related cataract, bilateral: Secondary | ICD-10-CM | POA: Diagnosis not present

## 2019-05-23 DIAGNOSIS — H5213 Myopia, bilateral: Secondary | ICD-10-CM | POA: Diagnosis not present

## 2019-05-29 ENCOUNTER — Ambulatory Visit: Payer: Self-pay | Admitting: Pharmacist

## 2019-05-29 NOTE — Chronic Care Management (AMB) (Signed)
  Chronic Care Management   Note  05/29/2019 Name: William Murray MRN: [id number] DOB: 03-15-1951  [name] is a 68 y.o. year old male who is a primary care patient of Cannady, Barbaraann Faster, NP. The CCM team was consulted for assistance with chronic disease management and care coordination needs.    Received notice that patient was failing statin and RAAS adherence measures. Attempted to outreach patient to discuss chronic care management services; left HIPAA compliant message for him to return my call at his convenience.   Follow up plan: - If I do not hear back from patient, will outreach again in the next 4-6 weeks  Catie Darnelle Maffucci, PharmD Clinical Pharmacist [place] [phone]

## 2019-05-30 ENCOUNTER — Telehealth: Payer: Self-pay

## 2019-06-19 DIAGNOSIS — H524 Presbyopia: Secondary | ICD-10-CM | POA: Diagnosis not present

## 2019-07-09 ENCOUNTER — Telehealth: Payer: Self-pay

## 2019-07-09 ENCOUNTER — Ambulatory Visit: Payer: Self-pay | Admitting: Pharmacist

## 2019-07-09 NOTE — Chronic Care Management (AMB) (Signed)
  Chronic Care Management   Note  07/09/2019 Name: JENSYN CAMBRIA MRN: [id number] DOB: Nov 28, 1950  GRAYSON PFEFFERLE is a 68 y.o. year old male who is a primary care patient of Cannady, Barbaraann Faster, NP. The CCM team was consulted for assistance with chronic disease management and care coordination needs.    Attempted to contact patient to discuss chronic care management. Left HIPAA compliant message for patient to return my call at his convenience. Third unsuccessful outreach attempt.   Follow up plan: - If patient interested in working with CCM team in the future, I would be happy to attempt to outreach again with a new referral.   Catie Darnelle Maffucci, PharmD Clinical Pharmacist [place] [phone]

## 2019-07-14 ENCOUNTER — Ambulatory Visit: Payer: Self-pay | Admitting: Pharmacist

## 2019-07-14 NOTE — Chronic Care Management (AMB) (Signed)
  Chronic Care Management   Note  07/14/2019 Name: William Murray MRN: [id number] DOB: [birth year]   Subjective:  William Murray is a 68 y.o. year old male who is a primary care patient of Cannady, Barbaraann Faster, NP. The CCM team was consulted for assistance with chronic disease management and care coordination needs, specifically related to patient appearing non-adherent to his cholesterol medication.   Patient DECLINED services at this time. Encouraged patient to contact primary care provider if he has questions or concerns in the future.   Catie Darnelle Maffucci, PharmD Clinical Pharmacist [place] [phone]

## 2019-08-28 ENCOUNTER — Ambulatory Visit: Payer: Medicare Other | Admitting: Nurse Practitioner

## 2019-09-01 ENCOUNTER — Other Ambulatory Visit: Payer: Self-pay

## 2019-09-02 ENCOUNTER — Ambulatory Visit (INDEPENDENT_AMBULATORY_CARE_PROVIDER_SITE_OTHER): Payer: Medicare Other | Admitting: Nurse Practitioner

## 2019-09-02 ENCOUNTER — Encounter: Payer: Self-pay | Admitting: Nurse Practitioner

## 2019-09-02 ENCOUNTER — Other Ambulatory Visit: Payer: Self-pay

## 2019-09-02 VITALS — BP 149/82 | HR 62 | Temp 98.2°F

## 2019-09-02 DIAGNOSIS — G2581 Restless legs syndrome: Secondary | ICD-10-CM

## 2019-09-02 DIAGNOSIS — M79641 Pain in right hand: Secondary | ICD-10-CM | POA: Diagnosis not present

## 2019-09-02 DIAGNOSIS — M79642 Pain in left hand: Secondary | ICD-10-CM | POA: Diagnosis not present

## 2019-09-02 DIAGNOSIS — Z23 Encounter for immunization: Secondary | ICD-10-CM | POA: Diagnosis not present

## 2019-09-02 NOTE — Progress Notes (Signed)
BP (!) 149/82 (BP Location: Left Arm, Cuff Size: Normal)   Pulse 62   Temp 98.2 F (36.8 C) (Oral)   SpO2 98%    Subjective:    Patient ID: William Murray, male    DOB: 21-Feb-1951, 68 y.o.   MRN: [id number]  HPI: William Murray is a 68 y.o. male  Chief Complaint  Patient presents with  . Joint Pain    pt states he has been having joint pain all over, states it has gotten worse in the last month  . Muscle Pain    pt states he has been having muscle pain and cramps in his calves for the last month or so    HAND PAIN Worse to bilateral hands.  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  Has not worked on truck for a couple weeks now, prior to this was working on it 3-4 hours a day.  Current medications include Lipitor and Losartan-HCTZ.  Last labs in August showed normal electrolytes and liver function.  He reports this joint pain to hands started 2 months ago.  Currently does endorse drinking two to three mixed drinks a day.  Denies any swelling, redness, or warmth to hands.  Reports pain is worse in morning, but does improve after about one hour.  Reports since he retired he has not done much physical work until lately. Right hand dominant and pain worse in this hand.  Denies recent history of tick bite. Duration: months Mechanism of injury: unknown Location: bilateral hands Onset: gradual Severity: 7/10 at worst Quality: dull and aching Frequency: intermittent Aggravating factors: putting in hot water and moving them (whole hands bilaterally) Alleviating factors: rest Status: fluctuating Treatments attempted: rest Relief with NSAIDs?: none Nighttime pain:  no Paresthesias / decreased sensation:  no Fevers:  no  RESTLESS LEGS Reports cramps in bilateral legs on/off overnight, he will lift his legs and they go away.  No legs cramps during day and no pain with walking.  He reports he has had issues with restless  legs for several years, when he sleeps at night constantly moving legs and intermittent aches and pains with this. Duration: years Discomfort description:  cramping Pain: yes Location: calves Bilateral: yes Symmetric: yes Severity: mild Onset:  gradual Frequency:  intermittent Symptoms only occur while legs at rest: yes Sudden unintentional leg jerking: no Bed partner bothered by leg movements: no LE numbness: no Decreased sensation: no Weakness: no Insomnia: yes Daytime somnolence: no Fatigue: no Alleviating factors: moving legs Aggravating factors: rest at night Status: fluctuating Treatments attempted: none  Relevant past medical, surgical, family and social history reviewed and updated as indicated. Interim medical history since our last visit reviewed. Allergies and medications reviewed and updated.  Review of Systems  Constitutional: Negative for activity change, diaphoresis, fatigue and fever.  Respiratory: Negative for cough, chest tightness, shortness of breath and wheezing.   Cardiovascular: Negative for chest pain, palpitations and leg swelling.  Gastrointestinal: Negative for abdominal distention, abdominal pain, constipation, diarrhea, nausea and vomiting.  Endocrine: Negative for cold intolerance, heat intolerance, polydipsia, polyphagia and polyuria.  Musculoskeletal: Positive for arthralgias.  Neurological: Negative for dizziness, syncope, weakness, light-headedness, numbness and headaches.  Psychiatric/Behavioral: Negative.     Per HPI unless specifically indicated above     Objective:    BP (!) 149/82 (BP Location: Left Arm, Cuff Size: Normal)   Pulse 62   Temp 98.2  F (36.8 C) (Oral)   SpO2 98%   Wt Readings from Last 3 Encounters:  05/19/19 201 lb (91.2 kg)  05/14/19 200 lb (90.7 kg)  04/10/19 200 lb (90.7 kg)    Physical Exam Vitals signs and nursing note reviewed.  Constitutional:      General: He is awake. He is not in acute distress.     Appearance: He is well-developed and overweight. He is not ill-appearing.  HENT:     Head: Normocephalic and atraumatic.     Right Ear: Hearing normal. No drainage.     Left Ear: Hearing normal. No drainage.  Eyes:     General: Lids are normal.        Right eye: No discharge.        Left eye: No discharge.     Conjunctiva/sclera: Conjunctivae normal.     Pupils: Pupils are equal, round, and reactive to light.  Neck:     Musculoskeletal: Normal range of motion and neck supple.     Thyroid: No thyromegaly.     Vascular: No carotid bruit.  Cardiovascular:     Rate and Rhythm: Normal rate and regular rhythm.     Pulses:          Dorsalis pedis pulses are 2+ on the right side and 2+ on the left side.       Posterior tibial pulses are 1+ on the right side and 1+ on the left side.     Heart sounds: Normal heart sounds, S1 normal and S2 normal. No murmur. No gallop.   Pulmonary:     Effort: Pulmonary effort is normal. No accessory muscle usage or respiratory distress.     Breath sounds: Normal breath sounds.  Abdominal:     General: Bowel sounds are normal.     Palpations: Abdomen is soft. There is no hepatomegaly or splenomegaly.  Musculoskeletal: Normal range of motion.     Right hand: He exhibits normal range of motion, no tenderness, normal capillary refill and no swelling. Normal sensation noted. Normal strength noted.     Left hand: He exhibits normal range of motion, no tenderness and no swelling. Normal sensation noted. Normal strength noted.     Right lower leg: He exhibits no tenderness, no swelling and no laceration. No edema.     Left lower leg: He exhibits no tenderness, no swelling and no laceration. No edema.     Comments: No decreased in AROM hands bialterally, but tenderness reported while making fist and with movement of finger tip to finger tip, with R>L in tenderness with ROM.  No swelling, warmth, or erythema noted.    Full AROM of BLE with negative Homan's test  bilaterally.  Intact sensation.  No rashes or bruising noted.    Skin:    General: Skin is warm and dry.     Capillary Refill: Capillary refill takes less than 2 seconds.     Findings: No rash.  Neurological:     Mental Status: He is alert and oriented to person, place, and time.     Deep Tendon Reflexes: Reflexes are normal and symmetric.     Reflex Scores:      Brachioradialis reflexes are 2+ on the right side and 2+ on the left side.      Patellar reflexes are 2+ on the right side and 2+ on the left side. Psychiatric:        Mood and Affect: Mood normal.  Behavior: Behavior normal. Behavior is cooperative.        Thought Content: Thought content normal.        Judgment: Judgment normal.     Results for orders placed or performed in visit on 05/19/19  Comprehensive metabolic panel  Result Value Ref Range   Glucose 103 (H) 65 - 99 mg/dL   BUN 14 8 - 27 mg/dL   Creatinine, Ser 0.95 0.76 - 1.27 mg/dL   GFR calc non Af Amer 81 >59 mL/min/1.73   GFR calc Af Amer 94 >59 mL/min/1.73   BUN/Creatinine Ratio 15 10 - 24   Sodium 143 134 - 144 mmol/L   Potassium 5.0 3.5 - 5.2 mmol/L   Chloride 106 96 - 106 mmol/L   CO2 24 20 - 29 mmol/L   Calcium 10.0 8.6 - 10.2 mg/dL   Total Protein 7.3 6.0 - 8.5 g/dL   Albumin 4.5 3.8 - 4.8 g/dL   Globulin, Total 2.8 1.5 - 4.5 g/dL   Albumin/Globulin Ratio 1.6 1.2 - 2.2   Bilirubin Total 0.7 0.0 - 1.2 mg/dL   Alkaline Phosphatase 90 39 - 117 IU/L   AST 24 0 - 40 IU/L   ALT 11 0 - 44 IU/L  Lipid Panel w/o Chol/HDL Ratio  Result Value Ref Range   Cholesterol, Total 161 100 - 199 mg/dL   Triglycerides 193 (H) 0 - 149 mg/dL   HDL 38 (L) >39 mg/dL   VLDL Cholesterol Cal 39 5 - 40 mg/dL   LDL Calculated 84 0 - 99 mg/dL  Thyroid Panel With TSH  Result Value Ref Range   TSH 4.320 0.450 - 4.500 uIU/mL   T4, Total 6.8 4.5 - 12.0 ug/dL   T3 Uptake Ratio 31 24 - 39 %   Free Thyroxine Index 2.1 1.2 - 4.9      Assessment & Plan:   Problem  List Items Addressed This Visit      Other   Restless leg syndrome - Primary    Based on HPI and exam suspect RLS.  Will obtain labs: Mag, uric acid, CMP, CBC.  Recommend taking Magnesium 400 MG in evening and will assess if benefit present.  Will consider trial of Gabapentin if ongoing discomfort and labs return within normal range.  Return in one week.      Relevant Orders   Comprehensive metabolic panel   Magnesium   Pain in both hands    Suspect arthritic changes exacerbated by recent work on truck.  Will obtain labs to r/o other causes: CMP, ANA, ESR, CRP, CBC, and uric acid.  Recommend trial of Tylenol as needed, may take up to 1000 MG three times a day for discomfort.  Continue to rest hands and perform daily gentle stretches.  If ongoing discomfort will consider imaging.  Return in one week.      Relevant Orders   ANA w/Reflex if Positive   C-reactive protein   Sed Rate (ESR)   CBC with Differential/Platelet out   Uric acid    Other Visit Diagnoses    Need for influenza vaccination       Relevant Orders   Flu Vaccine QUAD High Dose(Fluad) (Completed)       Follow up plan: Return in about 1 week (around 09/09/2019) for Restless leg and hand pain.

## 2019-09-02 NOTE — Assessment & Plan Note (Signed)
Based on HPI and exam suspect RLS.  Will obtain labs: Mag, uric acid, CMP, CBC.  Recommend taking Magnesium 400 MG in evening and will assess if benefit present.  Will consider trial of Gabapentin if ongoing discomfort and labs return within normal range.  Return in one week.

## 2019-09-02 NOTE — Assessment & Plan Note (Addendum)
Suspect arthritic changes exacerbated by recent work on truck.  Will obtain labs to r/o other causes: CMP, ANA, ESR, CRP, CBC, and uric acid.  Recommend trial of Tylenol as needed, may take up to 1000 MG three times a day for discomfort.  Continue to rest hands and perform daily gentle stretches.  If ongoing discomfort will consider imaging.  Return in one week.

## 2019-09-02 NOTE — Patient Instructions (Addendum)
Tylenol can take up to 1000 MG three times a day as needed Add potassium rich foods to diet and can try Magnesium oxide 400 MG a day.  Osteoarthritis  Osteoarthritis is a type of arthritis that affects tissue that covers the ends of bones in joints (cartilage). Cartilage acts as a cushion between the bones and helps them move smoothly. Osteoarthritis results when cartilage in the joints gets worn down. Osteoarthritis is sometimes called "wear and tear" arthritis. Osteoarthritis is the most common form of arthritis. It often occurs in older people. It is a condition that gets worse over time (a progressive condition). Joints that are most often affected by this condition are in:  Fingers.  Toes.  Hips.  Knees.  Spine, including neck and lower back. What are the causes? This condition is caused by age-related wearing down of cartilage that covers the ends of bones. What increases the risk? The following factors may make you more likely to develop this condition:  Older age.  Being overweight or obese.  Overuse of joints, such as in athletes.  Past injury of a joint.  Past surgery on a joint.  Family history of osteoarthritis. What are the signs or symptoms? The main symptoms of this condition are pain, swelling, and stiffness in the joint. The joint may lose its shape over time. Small pieces of bone or cartilage may break off and float inside of the joint, which may cause more pain and damage to the joint. Small deposits of bone (osteophytes) may grow on the edges of the joint. Other symptoms may include:  A grating or scraping feeling inside the joint when you move it.  Popping or creaking sounds when you move. Symptoms may affect one or more joints. Osteoarthritis in a major joint, such as your knee or hip, can make it painful to walk or exercise. If you have osteoarthritis in your hands, you might not be able to grip items, twist your hand, or control small movements of your  hands and fingers (fine motor skills). How is this diagnosed? This condition may be diagnosed based on:  Your medical history.  A physical exam.  Your symptoms.  X-rays of the affected joint(s).  Blood tests to rule out other types of arthritis. How is this treated? There is no cure for this condition, but treatment can help to control pain and improve joint function. Treatment plans may include:  A prescribed exercise program that allows for rest and joint relief. You may work with a physical therapist.  A weight control plan.  Pain relief techniques, such as: ? Applying heat and cold to the joint. ? Electric pulses delivered to nerve endings under the skin (transcutaneous electrical nerve stimulation, or TENS). ? Massage. ? Certain nutritional supplements.  NSAIDs or prescription medicines to help relieve pain.  Medicine to help relieve pain and inflammation (corticosteroids). This can be given by mouth (orally) or as an injection.  Assistive devices, such as a brace, wrap, splint, specialized glove, or cane.  Surgery, such as: ? An osteotomy. This is done to reposition the bones and relieve pain or to remove loose pieces of bone and cartilage. ? Joint replacement surgery. You may need this surgery if you have very bad (advanced) osteoarthritis. Follow these instructions at home: Activity  Rest your affected joints as directed by your health care provider.  Do not drive or use heavy machinery while taking prescription pain medicine.  Exercise as directed. Your health care provider or physical therapist may  recommend specific types of exercise, such as: ? Strengthening exercises. These are done to strengthen the muscles that support joints that are affected by arthritis. They can be performed with weights or with exercise bands to add resistance. ? Aerobic activities. These are exercises, such as brisk walking or water aerobics, that get your heart pumping. ?  Range-of-motion activities. These keep your joints easy to move. ? Balance and agility exercises. Managing pain, stiffness, and swelling      If directed, apply heat to the affected area as often as told by your health care provider. Use the heat source that your health care provider recommends, such as a moist heat pack or a heating pad. ? If you have a removable assistive device, remove it as told by your health care provider. ? Place a towel between your skin and the heat source. If your health care provider tells you to keep the assistive device on while you apply heat, place a towel between the assistive device and the heat source. ? Leave the heat on for 20-30 minutes. ? Remove the heat if your skin turns bright red. This is especially important if you are unable to feel pain, heat, or cold. You may have a greater risk of getting burned.  If directed, put ice on the affected joint: ? If you have a removable assistive device, remove it as told by your health care provider. ? Put ice in a plastic bag. ? Place a towel between your skin and the bag. If your health care provider tells you to keep the assistive device on during icing, place a towel between the assistive device and the bag. ? Leave the ice on for 20 minutes, 2-3 times a day. General instructions  Take over-the-counter and prescription medicines only as told by your health care provider.  Maintain a healthy weight. Follow instructions from your health care provider for weight control. These may include dietary restrictions.  Do not use any products that contain nicotine or tobacco, such as cigarettes and e-cigarettes. These can delay bone healing. If you need help quitting, ask your health care provider.  Use assistive devices as directed by your health care provider.  Keep all follow-up visits as told by your health care provider. This is important. Where to find more information  [employer] of Arthritis and  Musculoskeletal and Skin Diseases: www.niams.http://www.myers.net/  [employer] on Aging: https://walker.com/  American College of Rheumatology: www.rheumatology.org Contact a health care provider if:  Your skin turns red.  You develop a rash.  You have pain that gets worse.  You have a fever along with joint or muscle aches. Get help right away if:  You lose a lot of weight.  You suddenly lose your appetite.  You have night sweats. Summary  Osteoarthritis is a type of arthritis that affects tissue covering the ends of bones in joints (cartilage).  This condition is caused by age-related wearing down of cartilage that covers the ends of bones.  The main symptom of this condition is pain, swelling, and stiffness in the joint.  There is no cure for this condition, but treatment can help to control pain and improve joint function. This information is not intended to replace advice given to you by your health care provider. Make sure you discuss any questions you have with your health care provider. Document Released: 09/25/2005 Document Revised: 09/07/2017 Document Reviewed: 05/29/2016 Elsevier Patient Education  2020 [employer] Influenza (Flu) Vaccine (Inactivated or Recombinant): What You Need to  Know 1. Why get vaccinated? Influenza vaccine can prevent influenza (flu). Flu is a contagious disease that spreads around the [place] every year, usually between October and May. Anyone can get the flu, but it is more dangerous for some people. Infants and young children, people 165 years of age and older, pregnant women, and people with certain health conditions or a weakened immune system are at greatest risk of flu complications. Pneumonia, bronchitis, sinus infections and ear infections are examples of flu-related complications. If you have a medical condition, such as heart disease, cancer or diabetes, flu can make it worse. Flu can cause fever and chills, sore throat, muscle aches,  fatigue, cough, headache, and runny or stuffy nose. Some people may have vomiting and diarrhea, though this is more common in children than adults. Each year thousands of people in the [place] die from flu, and many more are hospitalized. Flu vaccine prevents millions of illnesses and flu-related visits to the doctor each year. 2. Influenza vaccine CDC recommends everyone 836 months of age and older get vaccinated every flu season. Children 6 months through 258 years of age may need 2 doses during a single flu season. Everyone else needs only 1 dose each flu season. It takes about 2 weeks for protection to develop after vaccination. There are many flu viruses, and they are always changing. Each year a new flu vaccine is made to protect against three or four viruses that are likely to cause disease in the upcoming flu season. Even when the vaccine doesn't exactly match these viruses, it may still provide some protection. Influenza vaccine does not cause flu. Influenza vaccine may be given at the same time as other vaccines. 3. Talk with your health care provider Tell your vaccine provider if the person getting the vaccine:  Has had an allergic reaction after a previous dose of influenza vaccine, or has any severe, life-threatening allergies.  Has ever had Guillain-Barr Syndrome (also called GBS). In some cases, your health care provider may decide to postpone influenza vaccination to a future visit. People with minor illnesses, such as a cold, may be vaccinated. People who are moderately or severely ill should usually wait until they recover before getting influenza vaccine. Your health care provider can give you more information. 4. Risks of a vaccine reaction  Soreness, redness, and swelling where shot is given, fever, muscle aches, and headache can happen after influenza vaccine.  There may be a very small increased risk of Guillain-Barr Syndrome (GBS) after inactivated influenza vaccine  (the flu shot). Young children who get the flu shot along with pneumococcal vaccine (PCV13), and/or DTaP vaccine at the same time might be slightly more likely to have a seizure caused by fever. Tell your health care provider if a child who is getting flu vaccine has ever had a seizure. People sometimes faint after medical procedures, including vaccination. Tell your provider if you feel dizzy or have vision changes or ringing in the ears. As with any medicine, there is a very remote chance of a vaccine causing a severe allergic reaction, other serious injury, or death. 5. What if there is a serious problem? An allergic reaction could occur after the vaccinated person leaves the clinic. If you see signs of a severe allergic reaction (hives, swelling of the face and throat, difficulty breathing, a fast heartbeat, dizziness, or weakness), call 9-1-1 and get the person to the nearest hospital. For other signs that concern you, call your health care provider. Adverse reactions  should be reported to the Vaccine Adverse Event Reporting System (VAERS). Your health care provider will usually file this report, or you can do it yourself. Visit the VAERS website at www.vaers.LAgents.no or call (984)828-0031.VAERS is only for reporting reactions, and VAERS staff do not give medical advice. 6. The National Vaccine Injury Compensation Program The [employer] Vaccine Injury Compensation Program (VICP) is a federal program that was created to compensate people who may have been injured by certain vaccines. Visit the VICP website at SpiritualWord.at or call (226) 705-8893 to learn about the program and about filing a claim. There is a time limit to file a claim for compensation. 7. How can I learn more?  Ask your healthcare provider.  Call your local or state health department.  Contact the Centers for Disease Control and Prevention (CDC): ? Call (509)839-4087 (1-800-CDC-INFO) or ? Visit CDC's  BiotechRoom.com.cy Vaccine Information Statement (Interim) Inactivated Influenza Vaccine (05/23/2018) This information is not intended to replace advice given to you by your health care provider. Make sure you discuss any questions you have with your health care provider. Document Released: 07/20/2006 Document Revised: 01/14/2019 Document Reviewed: 05/27/2018 Elsevier Patient Education  2020 [employer].

## 2019-09-03 LAB — SEDIMENTATION RATE: Sed Rate: 10 mm/hr (ref 0–30)

## 2019-09-03 LAB — C-REACTIVE PROTEIN: CRP: 1 mg/L (ref 0–10)

## 2019-09-03 LAB — CBC WITH DIFFERENTIAL/PLATELET
Basophils Absolute: 0.1 10*3/uL (ref 0.0–0.2)
Basos: 1 %
EOS (ABSOLUTE): 0.1 10*3/uL (ref 0.0–0.4)
Eos: 1 %
Hematocrit: 50.3 % (ref 37.5–51.0)
Hemoglobin: 17.2 g/dL (ref 13.0–17.7)
Immature Grans (Abs): 0 10*3/uL (ref 0.0–0.1)
Immature Granulocytes: 0 %
Lymphocytes Absolute: 3.5 10*3/uL — ABNORMAL HIGH (ref 0.7–3.1)
Lymphs: 38 %
MCH: 32.4 pg (ref 26.6–33.0)
MCHC: 34.2 g/dL (ref 31.5–35.7)
MCV: 95 fL (ref 79–97)
Monocytes Absolute: 0.7 10*3/uL (ref 0.1–0.9)
Monocytes: 8 %
Neutrophils Absolute: 4.9 10*3/uL (ref 1.4–7.0)
Neutrophils: 52 %
Platelets: 179 10*3/uL (ref 150–450)
RBC: 5.31 x10E6/uL (ref 4.14–5.80)
RDW: 12.7 % (ref 11.6–15.4)
WBC: 9.2 10*3/uL (ref 3.4–10.8)

## 2019-09-03 LAB — COMPREHENSIVE METABOLIC PANEL
ALT: 12 IU/L (ref 0–44)
AST: 26 IU/L (ref 0–40)
Albumin/Globulin Ratio: 1.5 (ref 1.2–2.2)
Albumin: 4.7 g/dL (ref 3.8–4.8)
Alkaline Phosphatase: 99 IU/L (ref 39–117)
BUN/Creatinine Ratio: 13 (ref 10–24)
BUN: 13 mg/dL (ref 8–27)
Bilirubin Total: 1.1 mg/dL (ref 0.0–1.2)
CO2: 18 mmol/L — ABNORMAL LOW (ref 20–29)
Calcium: 9.9 mg/dL (ref 8.6–10.2)
Chloride: 100 mmol/L (ref 96–106)
Creatinine, Ser: 0.98 mg/dL (ref 0.76–1.27)
GFR calc Af Amer: 90 mL/min/{1.73_m2} (ref >59)
GFR calc non Af Amer: 78 mL/min/{1.73_m2} (ref >59)
Globulin, Total: 3.2 g/dL (ref 1.5–4.5)
Glucose: 106 mg/dL — ABNORMAL HIGH (ref 65–99)
Potassium: 3.8 mmol/L (ref 3.5–5.2)
Sodium: 139 mmol/L (ref 134–144)
Total Protein: 7.9 g/dL (ref 6.0–8.5)

## 2019-09-03 LAB — ANA W/REFLEX IF POSITIVE: Anti Nuclear Antibody (ANA): NEGATIVE

## 2019-09-03 LAB — URIC ACID: Uric Acid: 8.4 mg/dL (ref 3.7–8.6)

## 2019-09-03 LAB — MAGNESIUM: Magnesium: 2.1 mg/dL (ref 1.6–2.3)

## 2019-09-14 ENCOUNTER — Encounter: Payer: Self-pay | Admitting: Nurse Practitioner

## 2019-09-14 DIAGNOSIS — N281 Cyst of kidney, acquired: Secondary | ICD-10-CM

## 2019-09-14 DIAGNOSIS — K760 Fatty (change of) liver, not elsewhere classified: Secondary | ICD-10-CM | POA: Insufficient documentation

## 2019-09-14 DIAGNOSIS — I700 Atherosclerosis of aorta: Secondary | ICD-10-CM | POA: Insufficient documentation

## 2019-09-14 HISTORY — DX: Cyst of kidney, acquired: N28.1

## 2019-09-16 ENCOUNTER — Ambulatory Visit: Payer: Medicare Other | Admitting: Nurse Practitioner

## 2019-09-24 ENCOUNTER — Other Ambulatory Visit: Payer: Self-pay

## 2019-09-24 ENCOUNTER — Encounter: Payer: Self-pay | Admitting: Nurse Practitioner

## 2019-09-24 ENCOUNTER — Ambulatory Visit (INDEPENDENT_AMBULATORY_CARE_PROVIDER_SITE_OTHER): Payer: Medicare Other | Admitting: Nurse Practitioner

## 2019-09-24 VITALS — BP 143/71 | Temp 98.0°F

## 2019-09-24 DIAGNOSIS — M79642 Pain in left hand: Secondary | ICD-10-CM

## 2019-09-24 DIAGNOSIS — G2581 Restless legs syndrome: Secondary | ICD-10-CM

## 2019-09-24 DIAGNOSIS — M79641 Pain in right hand: Secondary | ICD-10-CM

## 2019-09-24 DIAGNOSIS — M1A9XX0 Chronic gout, unspecified, without tophus (tophi): Secondary | ICD-10-CM | POA: Diagnosis not present

## 2019-09-24 NOTE — Patient Instructions (Signed)
Gout  Gout is painful swelling of your joints. Gout is a type of arthritis. It is caused by having too much uric acid in your body. Uric acid is a chemical that is made when your body breaks down substances called purines. If your body has too much uric acid, sharp crystals can form and build up in your joints. This causes pain and swelling. Gout attacks can happen quickly and be very painful (acute gout). Over time, the attacks can affect more joints and happen more often (chronic gout). What are the causes?  Too much uric acid in your blood. This can happen because: ? Your kidneys do not remove enough uric acid from your blood. ? Your body makes too much uric acid. ? You eat too many foods that are high in purines. These foods include organ meats, some seafood, and beer.  Trauma or stress. What increases the risk?  Having a family history of gout.  Being male and middle-aged.  Being male and having gone through menopause.  Being very overweight (obese).  Drinking alcohol, especially beer.  Not having enough water in the body (being dehydrated).  Losing weight too quickly.  Having an organ transplant.  Having lead poisoning.  Taking certain medicines.  Having kidney disease.  Having a skin condition called psoriasis. What are the signs or symptoms? An attack of acute gout usually happens in just one joint. The most common place is the big toe. Attacks often start at night. Other joints that may be affected include joints of the feet, ankle, knee, fingers, wrist, or elbow. Symptoms of an attack may include:  Very bad pain.  Warmth.  Swelling.  Stiffness.  Shiny, red, or purple skin.  Tenderness. The affected joint may be very painful to touch.  Chills and fever. Chronic gout may cause symptoms more often. More joints may be involved. You may also have white or yellow lumps (tophi) on your hands or feet or in other areas near your joints. How is this  treated?  Treatment for this condition has two phases: treating an acute attack and preventing future attacks.  Acute gout treatment may include: ? NSAIDs. ? Steroids. These are taken by mouth or injected into a joint. ? Colchicine. This medicine relieves pain and swelling. It can be given by mouth or through an IV tube.  Preventive treatment may include: ? Taking small doses of NSAIDs or colchicine daily. ? Using a medicine that reduces uric acid levels in your blood. ? Making changes to your diet. You may need to see a food expert (dietitian) about what to eat and drink to prevent gout. Follow these instructions at home: During a gout attack   If told, put ice on the painful area: ? Put ice in a plastic bag. ? Place a towel between your skin and the bag. ? Leave the ice on for 20 minutes, 2-3 times a day.  Raise (elevate) the painful joint above the level of your heart as often as you can.  Rest the joint as much as possible. If the joint is in your leg, you may be given crutches.  Follow instructions from your doctor about what you cannot eat or drink. Avoiding future gout attacks  Eat a low-purine diet. Avoid foods and drinks such as: ? Liver. ? Kidney. ? Anchovies. ? Asparagus. ? Herring. ? Mushrooms. ? Mussels. ? Beer.  Stay at a healthy weight. If you want to lose weight, talk with your doctor. Do not lose weight   too fast.  Start or continue an exercise plan as told by your doctor. Eating and drinking  Drink enough fluids to keep your pee (urine) pale yellow.  If you drink alcohol: ? Limit how much you use to:  0-1 drink a day for women.  0-2 drinks a day for men. ? Be aware of how much alcohol is in your drink. In the [place] one drink equals one 12 oz bottle of beer (355 mL), one 5 oz glass of wine (148 mL), or one 1 oz glass of hard liquor (44 mL). General instructions  Take over-the-counter and prescription medicines only as told by your doctor.  Do  not drive or use heavy machinery while taking prescription pain medicine.  Return to your normal activities as told by your doctor. Ask your doctor what activities are safe for you.  Keep all follow-up visits as told by your doctor. This is important. Contact a doctor if:  You have another gout attack.  You still have symptoms of a gout attack after 10 days of treatment.  You have problems (side effects) because of your medicines.  You have chills or a fever.  You have burning pain when you pee (urinate).  You have pain in your lower back or belly. Get help right away if:  You have very bad pain.  Your pain cannot be controlled.  You cannot pee. Summary  Gout is painful swelling of the joints.  The most common site of pain is the big toe, but it can affect other joints.  Medicines and avoiding some foods can help to prevent and treat gout attacks. This information is not intended to replace advice given to you by your health care provider. Make sure you discuss any questions you have with your health care provider. Document Released: 07/04/2008 Document Revised: 04/17/2018 Document Reviewed: 04/17/2018 Elsevier Patient Education  2020 [employer] Inc[employer]  

## 2019-09-24 NOTE — Assessment & Plan Note (Signed)
Improved at this time, taking Magnesium tablet daily.  Denies any further concerns at this time . Could consider Gabapentin in future if return of discomfort.

## 2019-09-24 NOTE — Assessment & Plan Note (Signed)
Improved at this time with Tylenol.  Will recheck uric acid in 4 weeks at visit, if remains elevated will restart his Allopurinol.  Unsure why he stopped taking, but may benefit from reinitiating this.

## 2019-09-24 NOTE — Assessment & Plan Note (Signed)
Uric acid level 8.4 on labs recently, will recheck this at visit in 4 weeks and if remains elevated will restart Allopurinol.  Unsure why he stopped taking, but may benefit from it back on board.

## 2019-09-24 NOTE — Progress Notes (Signed)
BP (!) 143/71   Temp 98 F (36.7 C) (Oral)    Subjective:    Patient ID: William Murray, male    DOB: 1951/05/30, 68 y.o.   MRN: [id number]  HPI: William Murray is a 68 y.o. male  Chief Complaint  Patient presents with  . RLS  . Hand Pain    . This visit was completed via telephone due to the restrictions of the COVID-19 pandemic. All issues as above were discussed and addressed but no physical exam was performed. If it was felt that the patient should be evaluated in the office, they were directed there. The patient verbally consented to this visit. Patient was unable to complete an audio/visual visit due to Lack of equipment. Due to the catastrophic nature of the COVID-19 pandemic, this visit was done through audio contact only. . Location of the patient: home . Location of the provider: home . Those involved with this call:  . Provider: Marnee Guarneri, DNP . CMA: Yvonna Alanis, CMA . Front Desk/Registration: Jill Side  . Time spent on call: 15 minutes on the phone discussing health concerns. 10 minutes total spent in review of patient's record and preparation of their chart.  . I verified patient identity using two factors (patient name and date of birth). Patient consents verbally to being seen via telemedicine visit today.    HAND PAIN Discussed at recent visit.  Labs all WNL with exception of uric acid slightly elevated at 8.4 (previous 6 to 7.7).  Worse to bilateral hands.  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  Has not worked on truck for a couple weeks now, prior to this was working on it 3-4 hours a day.  Currently does endorse drinking two to three mixed drinks a day.  Denies any swelling, redness, or warmth to hands. Reports pain is worse in morning, but does improve after about one hour. Reports since he retired he has not done much physical work until lately. Right hand dominant and pain worse in  this hand.  Denies recent history of tick bite.  Started taking Tylenol and he reports this is helping pain.  Denies any further concerns or issues at this time.  No current maintenance medicine for gout, on review he stopped taking in May. Duration: months Mechanism of injury: unknown Location: bilateral hands Onset: gradual Severity: 2/10 at worst Quality: dull and aching Frequency: intermittent Aggravating factors: putting in hot water and moving them (whole hands bilaterally) Alleviating factors: rest, Tylenol Status: fluctuating Treatments attempted: rest, Tylenol Relief with NSAIDs?: none Nighttime pain:  no Paresthesias / decreased sensation:  no Fevers:  no  RESTLESS LEGS Reported last visit cramps in bilateral legs on/off overnight, he will lift his legs and they go away.  No legs cramps during day and no pain with walking.  He reports he has had issues with restless legs for several years, when he sleeps at night constantly moving legs and intermittent aches and pains with this. Has been taking Magnesium since last visit which is helping.  No further leg cramp issues at this time. Duration: years Discomfort description:  cramping Pain: yes Location: calves Bilateral: yes Symmetric: yes Severity: mild Onset:  gradual Frequency:  intermittent Symptoms only occur while legs at rest: yes Sudden unintentional leg jerking: no Bed partner bothered by leg movements: no LE numbness: no Decreased sensation: no Weakness: no Insomnia: yes Daytime somnolence: no  Fatigue: no Alleviating factors: moving legs Aggravating factors: rest at night Status: fluctuating Treatments attempted: Magnesium  Relevant past medical, surgical, family and social history reviewed and updated as indicated. Interim medical history since our last visit reviewed. Allergies and medications reviewed and updated.  Review of Systems  Constitutional: Negative for activity change, diaphoresis,  fatigue and fever.  Respiratory: Negative for cough, chest tightness, shortness of breath and wheezing.   Cardiovascular: Negative for chest pain, palpitations and leg swelling.  Musculoskeletal: Positive for arthralgias.  Neurological: Negative.   Psychiatric/Behavioral: Negative.     Per HPI unless specifically indicated above     Objective:    BP (!) 143/71   Temp 98 F (36.7 C) (Oral)   Wt Readings from Last 3 Encounters:  05/19/19 201 lb (91.2 kg)  05/14/19 200 lb (90.7 kg)  04/10/19 200 lb (90.7 kg)    Physical Exam   Unable to perform due to telephone visit only  Results for orders placed or performed in visit on 09/02/19  ANA w/Reflex if Positive  Result Value Ref Range   Anti Nuclear Antibody (ANA) Negative Negative  C-reactive protein  Result Value Ref Range   CRP 1 0 - 10 mg/L  Sed Rate (ESR)  Result Value Ref Range   Sed Rate 10 0 - 30 mm/hr  CBC with Differential/Platelet out  Result Value Ref Range   WBC 9.2 3.4 - 10.8 x10E3/uL   RBC 5.31 4.14 - 5.80 x10E6/uL   Hemoglobin 17.2 13.0 - 17.7 g/dL   Hematocrit 50.3 37.5 - 51.0 %   MCV 95 79 - 97 fL   MCH 32.4 26.6 - 33.0 pg   MCHC 34.2 31.5 - 35.7 g/dL   RDW 12.7 11.6 - 15.4 %   Platelets 179 150 - 450 x10E3/uL   Neutrophils 52 Not Estab. %   Lymphs 38 Not Estab. %   Monocytes 8 Not Estab. %   Eos 1 Not Estab. %   Basos 1 Not Estab. %   Neutrophils Absolute 4.9 1.4 - 7.0 x10E3/uL   Lymphocytes Absolute 3.5 (H) 0.7 - 3.1 x10E3/uL   Monocytes Absolute 0.7 0.1 - 0.9 x10E3/uL   EOS (ABSOLUTE) 0.1 0.0 - 0.4 x10E3/uL   Basophils Absolute 0.1 0.0 - 0.2 x10E3/uL   Immature Granulocytes 0 Not Estab. %   Immature Grans (Abs) 0.0 0.0 - 0.1 x10E3/uL  Comprehensive metabolic panel  Result Value Ref Range   Glucose 106 (H) 65 - 99 mg/dL   BUN 13 8 - 27 mg/dL   Creatinine, Ser 0.98 0.76 - 1.27 mg/dL   GFR calc non Af Amer 78 >59 mL/min/1.73   GFR calc Af Amer 90 >59 mL/min/1.73   BUN/Creatinine Ratio 13 10  - 24   Sodium 139 134 - 144 mmol/L   Potassium 3.8 3.5 - 5.2 mmol/L   Chloride 100 96 - 106 mmol/L   CO2 18 (L) 20 - 29 mmol/L   Calcium 9.9 8.6 - 10.2 mg/dL   Total Protein 7.9 6.0 - 8.5 g/dL   Albumin 4.7 3.8 - 4.8 g/dL   Globulin, Total 3.2 1.5 - 4.5 g/dL   Albumin/Globulin Ratio 1.5 1.2 - 2.2   Bilirubin Total 1.1 0.0 - 1.2 mg/dL   Alkaline Phosphatase 99 39 - 117 IU/L   AST 26 0 - 40 IU/L   ALT 12 0 - 44 IU/L  Magnesium  Result Value Ref Range   Magnesium 2.1 1.6 - 2.3 mg/dL  Uric acid  Result  Value Ref Range   Uric Acid 8.4 3.7 - 8.6 mg/dL      Assessment & Plan:   Problem List Items Addressed This Visit      Other   Chronic gout    Uric acid level 8.4 on labs recently, will recheck this at visit in 4 weeks and if remains elevated will restart Allopurinol.  Unsure why he stopped taking, but may benefit from it back on board.      Restless leg syndrome    Improved at this time, taking Magnesium tablet daily.  Denies any further concerns at this time . Could consider Gabapentin in future if return of discomfort.      Pain in both hands - Primary    Improved at this time with Tylenol.  Will recheck uric acid in 4 weeks at visit, if remains elevated will restart his Allopurinol.  Unsure why he stopped taking, but may benefit from reinitiating this.         I discussed the assessment and treatment plan with the patient. The patient was provided an opportunity to ask questions and all were answered. The patient agreed with the plan and demonstrated an understanding of the instructions.   The patient was advised to call back or seek an in-person evaluation if the symptoms worsen or if the condition fails to improve as anticipated.   I provided 15 minutes of time during this encounter.  Follow up plan: Return in about 4 weeks (around 10/22/2019) for Gout follow-up and labs in office.

## 2019-10-01 DIAGNOSIS — Z719 Counseling, unspecified: Secondary | ICD-10-CM | POA: Diagnosis not present

## 2019-10-24 ENCOUNTER — Other Ambulatory Visit: Payer: Self-pay

## 2019-10-24 ENCOUNTER — Ambulatory Visit (INDEPENDENT_AMBULATORY_CARE_PROVIDER_SITE_OTHER): Payer: Medicare Other | Admitting: Nurse Practitioner

## 2019-10-24 ENCOUNTER — Encounter: Payer: Self-pay | Admitting: Nurse Practitioner

## 2019-10-24 VITALS — BP 144/74 | HR 59 | Temp 97.7°F

## 2019-10-24 DIAGNOSIS — M1A0710 Idiopathic chronic gout, right ankle and foot, without tophus (tophi): Secondary | ICD-10-CM | POA: Diagnosis not present

## 2019-10-24 DIAGNOSIS — I10 Essential (primary) hypertension: Secondary | ICD-10-CM | POA: Diagnosis not present

## 2019-10-24 MED ORDER — LOSARTAN POTASSIUM 100 MG PO TABS: 100.0000 mg | ORAL_TABLET | Freq: Every day | ORAL | 3 refills | Status: DC

## 2019-10-24 NOTE — Assessment & Plan Note (Signed)
Chronic, ongoing with continued intermittent elevations at home.  At this time will discontinue HCTZ due to uric acid >8 and history of gout flares. Increase Losartan to 100 MG, script sent.  Continue to monitor BP daily at home.  Return in 4 weeks for BP and lab recheck.

## 2019-10-24 NOTE — Patient Instructions (Signed)
DASH Eating Plan DASH stands for "Dietary Approaches to Stop Hypertension." The DASH eating plan is a healthy eating plan that has been shown to reduce high blood pressure (hypertension). It may also reduce your risk for type 2 diabetes, heart disease, and stroke. The DASH eating plan may also help with weight loss. What are tips for following this plan?  General guidelines  Avoid eating more than 2,300 mg (milligrams) of salt (sodium) a day. If you have hypertension, you may need to reduce your sodium intake to 1,500 mg a day.  Limit alcohol intake to no more than 1 drink a day for nonpregnant women and 2 drinks a day for men. One drink equals 12 oz of beer, 5 oz of wine, or 1 oz of hard liquor.  Work with your health care provider to maintain a healthy body weight or to lose weight. Ask what an ideal weight is for you.  Get at least 30 minutes of exercise that causes your heart to beat faster (aerobic exercise) most days of the week. Activities may include walking, swimming, or biking.  Work with your health care provider or diet and nutrition specialist (dietitian) to adjust your eating plan to your individual calorie needs. Reading food labels   Check food labels for the amount of sodium per serving. Choose foods with less than 5 percent of the Daily Value of sodium. Generally, foods with less than 300 mg of sodium per serving fit into this eating plan.  To find whole grains, look for the word "whole" as the first word in the ingredient list. Shopping  Buy products labeled as "low-sodium" or "no salt added."  Buy fresh foods. Avoid canned foods and premade or frozen meals. Cooking  Avoid adding salt when cooking. Use salt-free seasonings or herbs instead of table salt or sea salt. Check with your health care provider or pharmacist before using salt substitutes.  Do not fry foods. Cook foods using healthy methods such as baking, boiling, grilling, and broiling instead.  Cook with  heart-healthy oils, such as olive, canola, soybean, or sunflower oil. Meal planning  Eat a balanced diet that includes: ? 5 or more servings of fruits and vegetables each day. At each meal, try to fill half of your plate with fruits and vegetables. ? Up to 6-8 servings of whole grains each day. ? Less than 6 oz of lean meat, poultry, or fish each day. A 3-oz serving of meat is about the same size as a deck of cards. One egg equals 1 oz. ? 2 servings of low-fat dairy each day. ? A serving of nuts, seeds, or beans 5 times each week. ? Heart-healthy fats. Healthy fats called Omega-3 fatty acids are found in foods such as flaxseeds and coldwater fish, like sardines, salmon, and mackerel.  Limit how much you eat of the following: ? Canned or prepackaged foods. ? Food that is high in trans fat, such as fried foods. ? Food that is high in saturated fat, such as fatty meat. ? Sweets, desserts, sugary drinks, and other foods with added sugar. ? Full-fat dairy products.  Do not salt foods before eating.  Try to eat at least 2 vegetarian meals each week.  Eat more home-cooked food and less restaurant, buffet, and fast food.  When eating at a restaurant, ask that your food be prepared with less salt or no salt, if possible. What foods are recommended? The items listed may not be a complete list. Talk with your dietitian about   what dietary choices are best for you. Grains Whole-grain or whole-wheat bread. Whole-grain or whole-wheat pasta. Brown rice. Oatmeal. Quinoa. Bulgur. Whole-grain and low-sodium cereals. Pita bread. Low-fat, low-sodium crackers. Whole-wheat flour tortillas. Vegetables Fresh or frozen vegetables (raw, steamed, roasted, or grilled). Low-sodium or reduced-sodium tomato and vegetable juice. Low-sodium or reduced-sodium tomato sauce and tomato paste. Low-sodium or reduced-sodium canned vegetables. Fruits All fresh, dried, or frozen fruit. Canned fruit in natural juice (without  added sugar). Meat and other protein foods Skinless chicken or turkey. Ground chicken or turkey. Pork with fat trimmed off. Fish and seafood. Egg whites. Dried beans, peas, or lentils. Unsalted nuts, nut butters, and seeds. Unsalted canned beans. Lean cuts of beef with fat trimmed off. Low-sodium, lean deli meat. Dairy Low-fat (1%) or fat-free (skim) milk. Fat-free, low-fat, or reduced-fat cheeses. Nonfat, low-sodium ricotta or cottage cheese. Low-fat or nonfat yogurt. Low-fat, low-sodium cheese. Fats and oils Soft margarine without trans fats. Vegetable oil. Low-fat, reduced-fat, or light mayonnaise and salad dressings (reduced-sodium). Canola, safflower, olive, soybean, and sunflower oils. Avocado. Seasoning and other foods Herbs. Spices. Seasoning mixes without salt. Unsalted popcorn and pretzels. Fat-free sweets. What foods are not recommended? The items listed may not be a complete list. Talk with your dietitian about what dietary choices are best for you. Grains Baked goods made with fat, such as croissants, muffins, or some breads. Dry pasta or rice meal packs. Vegetables Creamed or fried vegetables. Vegetables in a cheese sauce. Regular canned vegetables (not low-sodium or reduced-sodium). Regular canned tomato sauce and paste (not low-sodium or reduced-sodium). Regular tomato and vegetable juice (not low-sodium or reduced-sodium). Pickles. Olives. Fruits Canned fruit in a light or heavy syrup. Fried fruit. Fruit in cream or butter sauce. Meat and other protein foods Fatty cuts of meat. Ribs. Fried meat. Bacon. Sausage. Bologna and other processed lunch meats. Salami. Fatback. Hotdogs. Bratwurst. Salted nuts and seeds. Canned beans with added salt. Canned or smoked fish. Whole eggs or egg yolks. Chicken or turkey with skin. Dairy Whole or 2% milk, cream, and half-and-half. Whole or full-fat cream cheese. Whole-fat or sweetened yogurt. Full-fat cheese. Nondairy creamers. Whipped toppings.  Processed cheese and cheese spreads. Fats and oils Butter. Stick margarine. Lard. Shortening. Ghee. Bacon fat. Tropical oils, such as coconut, palm kernel, or palm oil. Seasoning and other foods Salted popcorn and pretzels. Onion salt, garlic salt, seasoned salt, table salt, and sea salt. Worcestershire sauce. Tartar sauce. Barbecue sauce. Teriyaki sauce. Soy sauce, including reduced-sodium. Steak sauce. Canned and packaged gravies. Fish sauce. Oyster sauce. Cocktail sauce. Horseradish that you find on the shelf. Ketchup. Mustard. Meat flavorings and tenderizers. Bouillon cubes. Hot sauce and Tabasco sauce. Premade or packaged marinades. Premade or packaged taco seasonings. Relishes. Regular salad dressings. Where to find more information:  National Heart, Lung, and Blood Institute: www.nhlbi.nih.gov  American Heart Association: www.heart.org Summary  The DASH eating plan is a healthy eating plan that has been shown to reduce high blood pressure (hypertension). It may also reduce your risk for type 2 diabetes, heart disease, and stroke.  With the DASH eating plan, you should limit salt (sodium) intake to 2,300 mg a day. If you have hypertension, you may need to reduce your sodium intake to 1,500 mg a day.  When on the DASH eating plan, aim to eat more fresh fruits and vegetables, whole grains, lean proteins, low-fat dairy, and heart-healthy fats.  Work with your health care provider or diet and nutrition specialist (dietitian) to adjust your eating plan to your   individual calorie needs. This information is not intended to replace advice given to you by your health care provider. Make sure you discuss any questions you have with your health care provider. Document Revised: 09/07/2017 Document Reviewed: 09/18/2016 Elsevier Patient Education  2020 [employer] Inc.  

## 2019-10-24 NOTE — Assessment & Plan Note (Signed)
Chronic with recent uric acid 8.4.  Will adjust BP medications and discontinue HCTZ.  Refer to HTN plan.  Repeat uric acid level today and then recheck in 4 weeks without HCTZ on board.  Discussed diet changes at home and to reduce alcohol intake.  If continues to be elevated in 4 weeks will consider Allpurinol daily.

## 2019-10-24 NOTE — Progress Notes (Signed)
BP (!) 144/74 (BP Location: Left Arm, Patient Position: Sitting, Cuff Size: Normal)   Pulse (!) 59   Temp 97.7 F (36.5 C) (Oral)   SpO2 97%    Subjective:    Patient ID: William Murray, male    DOB: [birth year], 69 y.o.   MRN: [id number]  HPI: William Murray is a 69 y.o. male  Chief Complaint  Patient presents with  . Follow-up    gout and labs   GOUT Recent uric acid level 8.4 and patient complaint of pain in both hands.  He is on HCTZ.  States pain has improved to hands and not as bad as was, is taking occasional Tylenol.  Past gout flares present to right foot.  His last flare was in 2019. Duration: chronic Right 1st metatarsophalangeal pain: yes Left 1st metatarsophalangeal pain: no Right knee pain: no Left knee pain: no Swelling: no Redness: no Trauma: no Recent dietary change or indiscretion: has 2-3 bourbon a night Fevers: no Nausea/vomiting: no Aggravating factors: food Alleviating factors: Prednisone Status:  stable Treatments attempted: Prednisone  HYPERTENSION Continues on Losartan-HCTZ 50-12.5 MG.  Recent K+ in November was 3.8.  Hypertension status: stable  Satisfied with current treatment? yes Duration of hypertension: chronic BP monitoring frequency:  daily BP range: 138/70 something to 132/70 something and highest 153/80 something BP medication side effects:  no Medication compliance: good compliance Previous BP meds: Losartan and HCTZ Aspirin: yes Recurrent headaches: no Visual changes: no Palpitations: no Dyspnea: no Chest pain: no Lower extremity edema: no Dizzy/lightheaded: no  Relevant past medical, surgical, family and social history reviewed and updated as indicated. Interim medical history since our last visit reviewed. Allergies and medications reviewed and updated.  Review of Systems  Constitutional: Negative for activity change, diaphoresis, fatigue and fever.  Respiratory: Negative for cough, chest tightness, shortness of  breath and wheezing.   Cardiovascular: Negative for chest pain, palpitations and leg swelling.  Musculoskeletal: Negative for arthralgias.  Skin: Negative.   Neurological: Negative for dizziness, weakness, numbness and headaches.  Psychiatric/Behavioral: Negative.     Per HPI unless specifically indicated above     Objective:    BP (!) 144/74 (BP Location: Left Arm, Patient Position: Sitting, Cuff Size: Normal)   Pulse (!) 59   Temp 97.7 F (36.5 C) (Oral)   SpO2 97%   Wt Readings from Last 3 Encounters:  05/19/19 201 lb (91.2 kg)  05/14/19 200 lb (90.7 kg)  04/10/19 200 lb (90.7 kg)    Physical Exam Vitals and nursing note reviewed.  Constitutional:      General: He is not in acute distress.    Appearance: He is well-developed. He is obese. He is not ill-appearing.  HENT:     Head: Normocephalic and atraumatic.     Right Ear: Hearing normal. No drainage.     Left Ear: Hearing normal. No drainage.  Eyes:     General: Lids are normal.        Right eye: No discharge.        Left eye: No discharge.     Conjunctiva/sclera: Conjunctivae normal.     Pupils: Pupils are equal, round, and reactive to light.  Neck:     Vascular: No carotid bruit.  Cardiovascular:     Rate and Rhythm: Normal rate and regular rhythm.     Heart sounds: Normal heart sounds, S1 normal and S2 normal. No murmur. No gallop.   Pulmonary:     Effort: Pulmonary effort  is normal.     Breath sounds: Normal breath sounds.  Abdominal:     General: Bowel sounds are normal.     Palpations: Abdomen is soft.  Musculoskeletal:        General: Normal range of motion.     Right hand: Normal.     Left hand: Normal.     Cervical back: Normal range of motion and neck supple.     Right lower leg: No edema.     Left lower leg: No edema.  Skin:    General: Skin is warm and dry.  Neurological:     Mental Status: He is alert and oriented to person, place, and time.  Psychiatric:        Mood and Affect: Mood  normal.        Behavior: Behavior normal.        Thought Content: Thought content normal.        Judgment: Judgment normal.     Results for orders placed or performed in visit on 09/02/19  ANA w/Reflex if Positive  Result Value Ref Range   Anti Nuclear Antibody (ANA) Negative Negative  C-reactive protein  Result Value Ref Range   CRP 1 0 - 10 mg/L  Sed Rate (ESR)  Result Value Ref Range   Sed Rate 10 0 - 30 mm/hr  CBC with Differential/Platelet out  Result Value Ref Range   WBC 9.2 3.4 - 10.8 x10E3/uL   RBC 5.31 4.14 - 5.80 x10E6/uL   Hemoglobin 17.2 13.0 - 17.7 g/dL   Hematocrit 50.3 37.5 - 51.0 %   MCV 95 79 - 97 fL   MCH 32.4 26.6 - 33.0 pg   MCHC 34.2 31.5 - 35.7 g/dL   RDW 12.7 11.6 - 15.4 %   Platelets 179 150 - 450 x10E3/uL   Neutrophils 52 Not Estab. %   Lymphs 38 Not Estab. %   Monocytes 8 Not Estab. %   Eos 1 Not Estab. %   Basos 1 Not Estab. %   Neutrophils Absolute 4.9 1.4 - 7.0 x10E3/uL   Lymphocytes Absolute 3.5 (H) 0.7 - 3.1 x10E3/uL   Monocytes Absolute 0.7 0.1 - 0.9 x10E3/uL   EOS (ABSOLUTE) 0.1 0.0 - 0.4 x10E3/uL   Basophils Absolute 0.1 0.0 - 0.2 x10E3/uL   Immature Granulocytes 0 Not Estab. %   Immature Grans (Abs) 0.0 0.0 - 0.1 x10E3/uL  Comprehensive metabolic panel  Result Value Ref Range   Glucose 106 (H) 65 - 99 mg/dL   BUN 13 8 - 27 mg/dL   Creatinine, Ser 0.98 0.76 - 1.27 mg/dL   GFR calc non Af Amer 78 >59 mL/min/1.73   GFR calc Af Amer 90 >59 mL/min/1.73   BUN/Creatinine Ratio 13 10 - 24   Sodium 139 134 - 144 mmol/L   Potassium 3.8 3.5 - 5.2 mmol/L   Chloride 100 96 - 106 mmol/L   CO2 18 (L) 20 - 29 mmol/L   Calcium 9.9 8.6 - 10.2 mg/dL   Total Protein 7.9 6.0 - 8.5 g/dL   Albumin 4.7 3.8 - 4.8 g/dL   Globulin, Total 3.2 1.5 - 4.5 g/dL   Albumin/Globulin Ratio 1.5 1.2 - 2.2   Bilirubin Total 1.1 0.0 - 1.2 mg/dL   Alkaline Phosphatase 99 39 - 117 IU/L   AST 26 0 - 40 IU/L   ALT 12 0 - 44 IU/L  Magnesium  Result Value Ref Range    Magnesium 2.1 1.6 - 2.3 mg/dL  Uric acid  Result Value Ref Range   Uric Acid 8.4 3.7 - 8.6 mg/dL      Assessment & Plan:   Problem List Items Addressed This Visit      Cardiovascular and Mediastinum   Essential hypertension, benign - Primary    Chronic, ongoing with continued intermittent elevations at home.  At this time will discontinue HCTZ due to uric acid >8 and history of gout flares. Increase Losartan to 100 MG, script sent.  Continue to monitor BP daily at home.  Return in 4 weeks for BP and lab recheck.      Relevant Medications   losartan (COZAAR) 100 MG tablet   Other Relevant Orders   Basic Metabolic Panel (BMET)     Other   Chronic gout    Chronic with recent uric acid 8.4.  Will adjust BP medications and discontinue HCTZ.  Refer to HTN plan.  Repeat uric acid level today and then recheck in 4 weeks without HCTZ on board.  Discussed diet changes at home and to reduce alcohol intake.  If continues to be elevated in 4 weeks will consider Allpurinol daily.        Relevant Orders   Uric acid       Follow up plan: Return in about 4 weeks (around 11/21/2019) for HTN/HLD and Gout.

## 2019-10-25 LAB — BASIC METABOLIC PANEL
BUN/Creatinine Ratio: 16 (ref 10–24)
BUN: 14 mg/dL (ref 8–27)
CO2: 21 mmol/L (ref 20–29)
Calcium: 9.3 mg/dL (ref 8.6–10.2)
Chloride: 106 mmol/L (ref 96–106)
Creatinine, Ser: 0.9 mg/dL (ref 0.76–1.27)
GFR calc Af Amer: 100 mL/min/{1.73_m2} (ref >59)
GFR calc non Af Amer: 86 mL/min/{1.73_m2} (ref >59)
Glucose: 124 mg/dL — ABNORMAL HIGH (ref 65–99)
Potassium: 3.4 mmol/L — ABNORMAL LOW (ref 3.5–5.2)
Sodium: 140 mmol/L (ref 134–144)

## 2019-10-25 LAB — URIC ACID: Uric Acid: 7.9 mg/dL (ref 3.8–8.4)

## 2019-10-26 NOTE — Progress Notes (Signed)
Please let William Murray know his uric acid level is trending downwards, this is good.  I suspect with getting rid of his hydrochlorothiazide this will also help lower more.  Potassium was a little low at 3.4.  We will recheck both of these at his next visit 11/21/19, the Losartan increase may also help increase his potassium some.  I do want him to continue to cut back on alcohol intake at night and focus on a healthy diet at home.  Have a great day!!

## 2019-11-15 ENCOUNTER — Other Ambulatory Visit: Payer: Self-pay | Admitting: Nurse Practitioner

## 2019-11-15 NOTE — Telephone Encounter (Signed)
Requested Prescriptions  Pending Prescriptions Disp Refills  . atorvastatin (LIPITOR) 10 MG tablet [Pharmacy Med Name: ATORVASTATIN 10 MG TABLET] 90 tablet 2    Sig: TAKE 1 TABLET BY MOUTH EVERY DAY IN THE EVENING     Cardiovascular:  Antilipid - Statins Failed - 11/15/2019  9:10 AM      Failed - HDL in normal range and within 360 days    HDL  Date Value Ref Range Status  05/19/2019 38 (L) >39 mg/dL Final         Failed - Triglycerides in normal range and within 360 days    Triglycerides  Date Value Ref Range Status  05/19/2019 193 (H) 0 - 149 mg/dL Final   Triglycerides Piccolo,Waived  Date Value Ref Range Status  04/17/2017 176 (H) <150 mg/dL Final    Comment:                            Normal                   <150                         Borderline High     150 - 199                         High                200 - 499                         Very High                >499          Passed - Total Cholesterol in normal range and within 360 days    Cholesterol, Total  Date Value Ref Range Status  05/19/2019 161 100 - 199 mg/dL Final   Cholesterol Piccolo, Waived  Date Value Ref Range Status  04/17/2017 158 <200 mg/dL Final    Comment:                            Desirable                <200                         Borderline High      200- 239                         High                     >239          Passed - LDL in normal range and within 360 days    LDL Calculated  Date Value Ref Range Status  05/19/2019 84 0 - 99 mg/dL Final         Passed - Patient is not pregnant      Passed - Valid encounter within last 12 months    Recent Outpatient Visits          3 weeks ago Essential hypertension, benign   [facility] [place], [place] T, NP   1 month ago Pain in both hands   [facility]  Marnee Guarneri T, NP   2 months ago Restless leg syndrome   [facility] [place], [place] T, NP   6 months ago Stage 1 mild COPD by GOLD  classification ([place])   [place] Cannady, Barbaraann Faster, NP   9 months ago Essential hypertension, benign   [place], Barbaraann Faster, NP      Future Appointments            In 6 days Cannady, Barbaraann Faster, NP [employer]

## 2019-11-21 ENCOUNTER — Ambulatory Visit (INDEPENDENT_AMBULATORY_CARE_PROVIDER_SITE_OTHER): Payer: Medicare Other | Admitting: Nurse Practitioner

## 2019-11-21 ENCOUNTER — Other Ambulatory Visit: Payer: Self-pay

## 2019-11-21 ENCOUNTER — Encounter: Payer: Self-pay | Admitting: Nurse Practitioner

## 2019-11-21 VITALS — BP 131/78 | HR 64 | Temp 98.4°F

## 2019-11-21 DIAGNOSIS — I700 Atherosclerosis of aorta: Secondary | ICD-10-CM

## 2019-11-21 DIAGNOSIS — K760 Fatty (change of) liver, not elsewhere classified: Secondary | ICD-10-CM

## 2019-11-21 DIAGNOSIS — I10 Essential (primary) hypertension: Secondary | ICD-10-CM | POA: Diagnosis not present

## 2019-11-21 DIAGNOSIS — M1A9XX0 Chronic gout, unspecified, without tophus (tophi): Secondary | ICD-10-CM | POA: Diagnosis not present

## 2019-11-21 DIAGNOSIS — E7800 Pure hypercholesterolemia, unspecified: Secondary | ICD-10-CM | POA: Diagnosis not present

## 2019-11-21 DIAGNOSIS — Z87891 Personal history of nicotine dependence: Secondary | ICD-10-CM

## 2019-11-21 MED ORDER — ATORVASTATIN CALCIUM 10 MG PO TABS: 10.0000 mg | ORAL_TABLET | Freq: Every day | ORAL | 4 refills | Status: DC

## 2019-11-21 MED ORDER — ALLOPURINOL 100 MG PO TABS: 100.0000 mg | ORAL_TABLET | Freq: Every day | ORAL | 3 refills | Status: DC

## 2019-11-21 NOTE — Patient Instructions (Signed)
 Gout  Gout is painful swelling of your joints. Gout is a type of arthritis. It is caused by having too much uric acid in your body. Uric acid is a chemical that is made when your body breaks down substances called purines. If your body has too much uric acid, sharp crystals can form and build up in your joints. This causes pain and swelling. Gout attacks can happen quickly and be very painful (acute gout). Over time, the attacks can affect more joints and happen more often (chronic gout). What are the causes?  Too much uric acid in your blood. This can happen because: ? Your kidneys do not remove enough uric acid from your blood. ? Your body makes too much uric acid. ? You eat too many foods that are high in purines. These foods include organ meats, some seafood, and beer.  Trauma or stress. What increases the risk?  Having a family history of gout.  Being male and middle-aged.  Being male and having gone through menopause.  Being very overweight (obese).  Drinking alcohol, especially beer.  Not having enough water in the body (being dehydrated).  Losing weight too quickly.  Having an organ transplant.  Having lead poisoning.  Taking certain medicines.  Having kidney disease.  Having a skin condition called psoriasis. What are the signs or symptoms? An attack of acute gout usually happens in just one joint. The most common place is the big toe. Attacks often start at night. Other joints that may be affected include joints of the feet, ankle, knee, fingers, wrist, or elbow. Symptoms of an attack may include:  Very bad pain.  Warmth.  Swelling.  Stiffness.  Shiny, red, or purple skin.  Tenderness. The affected joint may be very painful to touch.  Chills and fever. Chronic gout may cause symptoms more often. More joints may be involved. You may also have white or yellow lumps (tophi) on your hands or feet or in other areas near your joints. How is this  treated?  Treatment for this condition has two phases: treating an acute attack and preventing future attacks.  Acute gout treatment may include: ? NSAIDs. ? Steroids. These are taken by mouth or injected into a joint. ? Colchicine. This medicine relieves pain and swelling. It can be given by mouth or through an IV tube.  Preventive treatment may include: ? Taking small doses of NSAIDs or colchicine daily. ? Using a medicine that reduces uric acid levels in your blood. ? Making changes to your diet. You may need to see a food expert (dietitian) about what to eat and drink to prevent gout. Follow these instructions at home: During a gout attack   If told, put ice on the painful area: ? Put ice in a plastic bag. ? Place a towel between your skin and the bag. ? Leave the ice on for 20 minutes, 2-3 times a day.  Raise (elevate) the painful joint above the level of your heart as often as you can.  Rest the joint as much as possible. If the joint is in your leg, you may be given crutches.  Follow instructions from your doctor about what you cannot eat or drink. Avoiding future gout attacks  Eat a low-purine diet. Avoid foods and drinks such as: ? Liver. ? Kidney. ? Anchovies. ? Asparagus. ? Herring. ? Mushrooms. ? Mussels. ? Beer.  Stay at a healthy weight. If you want to lose weight, talk with your doctor. Do not lose   weight too fast.  Start or continue an exercise plan as told by your doctor. Eating and drinking  Drink enough fluids to keep your pee (urine) pale yellow.  If you drink alcohol: ? Limit how much you use to:  0-1 drink a day for women.  0-2 drinks a day for men. ? Be aware of how much alcohol is in your drink. In the [place] one drink equals one 12 oz bottle of beer (355 mL), one 5 oz glass of wine (148 mL), or one 1 oz glass of hard liquor (44 mL). General instructions  Take over-the-counter and prescription medicines only as told by your doctor.  Do  not drive or use heavy machinery while taking prescription pain medicine.  Return to your normal activities as told by your doctor. Ask your doctor what activities are safe for you.  Keep all follow-up visits as told by your doctor. This is important. Contact a doctor if:  You have another gout attack.  You still have symptoms of a gout attack after 10 days of treatment.  You have problems (side effects) because of your medicines.  You have chills or a fever.  You have burning pain when you pee (urinate).  You have pain in your lower back or belly. Get help right away if:  You have very bad pain.  Your pain cannot be controlled.  You cannot pee. Summary  Gout is painful swelling of the joints.  The most common site of pain is the big toe, but it can affect other joints.  Medicines and avoiding some foods can help to prevent and treat gout attacks. This information is not intended to replace advice given to you by your health care provider. Make sure you discuss any questions you have with your health care provider. Document Revised: 04/17/2018 Document Reviewed: 04/17/2018 Elsevier Patient Education  2020 Else[employer] Inc.  

## 2019-11-21 NOTE — Assessment & Plan Note (Signed)
Chronic, ongoing with last uric acid level above goal.  Will initiate Allopurinol 100 MG daily as patient continues to have intermittent joint pain, suspect related to chronic gout and poor diet choices.  Recommend focus on gout friendly diet and alcohol cut back.  Return in 4 weeks for labs and exam.

## 2019-11-21 NOTE — Assessment & Plan Note (Signed)
Chronic, ongoing with BP stable at home and below goal in office today. Continue Losartan 100 MG daily and add additional medication as needed, try to avoid HCTZ due to gout history.  Continue to monitor BP daily at home.  DASH diet at home.  Return in 4 weeks for gout check.

## 2019-11-21 NOTE — Progress Notes (Signed)
BP 131/78   Pulse 64   Temp 98.4 F (36.9 C) (Oral)   SpO2 100%    Subjective:    Patient ID: William Murray, male    DOB: September 10, 1951, 69 y.o.   MRN: [id number]  HPI: William Murray is a 69 y.o. male  Chief Complaint  Patient presents with  . Gout  . Hyperlipidemia  . Hypertension   HYPERTENSION / HYPERLIPIDEMIA Continues on Losartan, Lipitor, and ASA. Last LDL 84 and TCHOL 161.  Discontinued HCTZ recently due to gout flares and increase Losartan to 100 MG, is tolerating well. Satisfied with current treatment? yes Duration of hypertension: chronic BP monitoring frequency: daily BP range: 130/70's at home on average BP medication side effects: no Duration of hyperlipidemia: chronic Cholesterol medication side effects: no Cholesterol supplements: none Medication compliance: good compliance Aspirin: yes Recent stressors: no Recurrent headaches: no Visual changes: no Palpitations: no Dyspnea: no Chest pain: no Lower extremity edema: no Dizzy/lightheaded: no  The 10-year ASCVD risk score William Murray DC Jr., et al., 2013) is: 21.9%   Values used to calculate the score:     Age: 63 years     Sex: Male     Is Non-Hispanic African American: No     Diabetic: No     Tobacco smoker: No     Systolic Blood Pressure: 093 mmHg     Is BP treated: Yes     HDL Cholesterol: 38 mg/dL     Total Cholesterol: 161 mg/dL  Current CrCl 90 based on [place] [facility]  GOUT No current maintenance medications.  Recent uric acid 7.9, trend down from 8.4.  He does report pain is better at this time, but does have continued intermittent pain in hands and knees still, about 4-5/10.  He does drink occasional beer at night. Duration:chronic Right 1st metatarsophalangeal pain: no Left 1st metatarsophalangeal pain: no Right knee pain: no Left knee pain: no Swelling: no Redness: no Trauma: no Recent dietary change or indiscretion: no Fevers: no Nausea/vomiting: no Aggravating factors: certain  foods Alleviating factors: nothing Status:  stable Treatments attempted: Prednisone in past  COPD Has Albuterol, but has not used this in a long time.  Quit smoking one to two years ago, prior to this smoked 1-2 PPD.  Started smoking at 69 years old.  Last lung CT screen was in 2020, remains stable with aortic atherosclerosis noted and hepatic steatosis, discussed with patient. COPD status: stable Satisfied with current treatment?: yes Oxygen use: no Dyspnea frequency:  Cough frequency:  Rescue inhaler frequency:   Limitation of activity: no Productive cough:  Last Spirometry: 2018 Pneumovax: Up to Date Influenza: Up to Date  Relevant past medical, surgical, family and social history reviewed and updated as indicated. Interim medical history since our last visit reviewed. Allergies and medications reviewed and updated.  Review of Systems  Constitutional: Negative for activity change, diaphoresis, fatigue and fever.  Respiratory: Negative for cough, chest tightness, shortness of breath and wheezing.   Cardiovascular: Negative for chest pain, palpitations and leg swelling.  Gastrointestinal: Negative.   Musculoskeletal: Positive for arthralgias.  Neurological: Negative.  Negative for dizziness.  Psychiatric/Behavioral: Negative.     Per HPI unless specifically indicated above     Objective:    BP 131/78   Pulse 64   Temp 98.4 F (36.9 C) (Oral)   SpO2 100%   Wt Readings from Last 3 Encounters:  05/19/19 201 lb (91.2 kg)  05/14/19 200 lb (90.7 kg)  04/10/19 200  lb (90.7 kg)    Physical Exam Vitals and nursing note reviewed.  Constitutional:      General: He is awake. He is not in acute distress.    Appearance: He is well-developed. He is obese. He is not ill-appearing.  HENT:     Head: Normocephalic and atraumatic.     Right Ear: Hearing normal. No drainage.     Left Ear: Hearing normal. No drainage.  Eyes:     General: Lids are normal.        Right eye: No  discharge.        Left eye: No discharge.     Conjunctiva/sclera: Conjunctivae normal.     Pupils: Pupils are equal, round, and reactive to light.  Neck:     Vascular: No carotid bruit.  Cardiovascular:     Rate and Rhythm: Normal rate and regular rhythm.     Heart sounds: Normal heart sounds, S1 normal and S2 normal. No murmur. No gallop.   Pulmonary:     Effort: Pulmonary effort is normal. No accessory muscle usage or respiratory distress.     Breath sounds: Normal breath sounds.  Abdominal:     General: Bowel sounds are normal.     Palpations: Abdomen is soft.  Musculoskeletal:        General: Normal range of motion.     Right hand: Normal.     Left hand: Normal.     Cervical back: Normal range of motion and neck supple.     Right lower leg: No edema.     Left lower leg: No edema.  Skin:    General: Skin is warm and dry.  Neurological:     Mental Status: He is alert and oriented to person, place, and time.  Psychiatric:        Mood and Affect: Mood normal.        Behavior: Behavior normal. Behavior is cooperative.        Thought Content: Thought content normal.        Judgment: Judgment normal.     Results for orders placed or performed in visit on 10/24/19  Uric acid  Result Value Ref Range   Uric Acid 7.9 3.8 - 8.4 mg/dL  Basic Metabolic Panel (BMET)  Result Value Ref Range   Glucose 124 (H) 65 - 99 mg/dL   BUN 14 8 - 27 mg/dL   Creatinine, Ser 8.41 0.76 - 1.27 mg/dL   GFR calc non Af Amer 86 >59 mL/min/1.73   GFR calc Af Amer 100 >59 mL/min/1.73   BUN/Creatinine Ratio 16 10 - 24   Sodium 140 134 - 144 mmol/L   Potassium 3.4 (L) 3.5 - 5.2 mmol/L   Chloride 106 96 - 106 mmol/L   CO2 21 20 - 29 mmol/L   Calcium 9.3 8.6 - 10.2 mg/dL      Assessment & Plan:   Problem List Items Addressed This Visit      Cardiovascular and Mediastinum   Essential hypertension, benign    Chronic, ongoing with BP stable at home and below goal in office today. Continue Losartan  100 MG daily and add additional medication as needed, try to avoid HCTZ due to gout history.  Continue to monitor BP daily at home.  DASH diet at home.  Return in 4 weeks for gout check.      Relevant Medications   atorvastatin (LIPITOR) 10 MG tablet   Other Relevant Orders   Comprehensive metabolic panel  Aortic atherosclerosis (HCC) - Primary    Continue Lipitor and ASA daily for prevention.  Continue complete cessation of smoking.  CT scan yearly until age 34.      Relevant Medications   atorvastatin (LIPITOR) 10 MG tablet     Digestive   Hepatic steatosis    Discussed focus on healthy diet and modest weight loss + cut back on alcohol intake.          Other   Hypercholesteremia    Chronic, ongoing.  Continue current medication regimen and adjust as needed.  Lipid panel today, fasting.      Relevant Medications   atorvastatin (LIPITOR) 10 MG tablet   Other Relevant Orders   Comprehensive metabolic panel   Lipid Panel w/o Chol/HDL Ratio   Chronic gout    Chronic, ongoing with last uric acid level above goal.  Will initiate Allopurinol 100 MG daily as patient continues to have intermittent joint pain, suspect related to chronic gout and poor diet choices.  Recommend focus on gout friendly diet and alcohol cut back.  Return in 4 weeks for labs and exam.      Relevant Orders   Uric acid   History of smoking    Continue cessation of smoking and yearly CT scan until age 36.          Follow up plan: Return in about 4 weeks (around 12/19/2019) for Gout.

## 2019-11-21 NOTE — Assessment & Plan Note (Signed)
Continue cessation of smoking and yearly CT scan until age 69.

## 2019-11-21 NOTE — Assessment & Plan Note (Signed)
Chronic, ongoing.  Continue current medication regimen and adjust as needed.  Lipid panel today, fasting. 

## 2019-11-21 NOTE — Assessment & Plan Note (Signed)
Discussed focus on healthy diet and modest weight loss + cut back on alcohol intake.

## 2019-11-21 NOTE — Assessment & Plan Note (Signed)
Continue Lipitor and ASA daily for prevention.  Continue complete cessation of smoking.  CT scan yearly until age 69.

## 2019-11-22 LAB — COMPREHENSIVE METABOLIC PANEL
ALT: 14 IU/L (ref 0–44)
AST: 24 IU/L (ref 0–40)
Albumin/Globulin Ratio: 1.7 (ref 1.2–2.2)
Albumin: 4.6 g/dL (ref 3.8–4.8)
Alkaline Phosphatase: 85 IU/L (ref 39–117)
BUN/Creatinine Ratio: 15 (ref 10–24)
BUN: 14 mg/dL (ref 8–27)
Bilirubin Total: 0.7 mg/dL (ref 0.0–1.2)
CO2: 21 mmol/L (ref 20–29)
Calcium: 9.6 mg/dL (ref 8.6–10.2)
Chloride: 104 mmol/L (ref 96–106)
Creatinine, Ser: 0.92 mg/dL (ref 0.76–1.27)
GFR calc Af Amer: 97 mL/min/{1.73_m2} (ref >59)
GFR calc non Af Amer: 84 mL/min/{1.73_m2} (ref >59)
Globulin, Total: 2.7 g/dL (ref 1.5–4.5)
Glucose: 94 mg/dL (ref 65–99)
Potassium: 4.2 mmol/L (ref 3.5–5.2)
Sodium: 142 mmol/L (ref 134–144)
Total Protein: 7.3 g/dL (ref 6.0–8.5)

## 2019-11-22 LAB — LIPID PANEL W/O CHOL/HDL RATIO
Cholesterol, Total: 114 mg/dL (ref 100–199)
HDL: 35 mg/dL — ABNORMAL LOW (ref >39)
LDL Chol Calc (NIH): 51 mg/dL (ref 0–99)
Triglycerides: 168 mg/dL — ABNORMAL HIGH (ref 0–149)
VLDL Cholesterol Cal: 28 mg/dL (ref 5–40)

## 2019-11-22 LAB — URIC ACID: Uric Acid: 5.5 mg/dL (ref 3.8–8.4)

## 2019-11-22 NOTE — Progress Notes (Signed)
Please let Mr. Valenza know via phone or letter: - Kidney and liver function is normal + potassium level improved without hydrochlorothiazide on board.  - Cholesterol levels look good with exception of triglycerides, continue Atorvastatin and diet focus - Uric acid level is coming down, take Allopurinol as ordered daily and focus on diet to avoid gout flares.   Have a great day!!

## 2019-12-19 ENCOUNTER — Encounter: Payer: Self-pay | Admitting: Nurse Practitioner

## 2019-12-19 ENCOUNTER — Other Ambulatory Visit: Payer: Self-pay

## 2019-12-19 ENCOUNTER — Ambulatory Visit (INDEPENDENT_AMBULATORY_CARE_PROVIDER_SITE_OTHER): Payer: Medicare Other | Admitting: Nurse Practitioner

## 2019-12-19 VITALS — BP 128/72 | HR 57 | Temp 97.7°F

## 2019-12-19 DIAGNOSIS — I10 Essential (primary) hypertension: Secondary | ICD-10-CM | POA: Diagnosis not present

## 2019-12-19 DIAGNOSIS — M1A9XX0 Chronic gout, unspecified, without tophus (tophi): Secondary | ICD-10-CM

## 2019-12-19 NOTE — Assessment & Plan Note (Signed)
Chronic, stable with addition of Allopurinol 100 MG daily, can adjust doses as needed.  Recheck uric acid level today and treat flares as needed.  Recommend monitoring diet and continue to cut back on alcohol use. ?

## 2019-12-19 NOTE — Assessment & Plan Note (Signed)
Chronic, ongoing with initial BP in office today elevated, but repeat below goal and home BP at goal. Continue Losartan 100 MG daily and add additional medication as needed, try to avoid HCTZ due to gout history. Could consider Amlodipine.  Continue to monitor BP daily at home.  DASH diet at home.  Return in 6 months.  BMP today.

## 2019-12-19 NOTE — Patient Instructions (Signed)
Claritin or Allegra -- take on tablet a day   Gout  Gout is painful swelling of your joints. Gout is a type of arthritis. It is caused by having too much uric acid in your body. Uric acid is a chemical that is made when your body breaks down substances called purines. If your body has too much uric acid, sharp crystals can form and build up in your joints. This causes pain and swelling. Gout attacks can happen quickly and be very painful (acute gout). Over time, the attacks can affect more joints and happen more often (chronic gout). What are the causes?  Too much uric acid in your blood. This can happen because: ? Your kidneys do not remove enough uric acid from your blood. ? Your body makes too much uric acid. ? You eat too many foods that are high in purines. These foods include organ meats, some seafood, and beer.  Trauma or stress. What increases the risk?  Having a family history of gout.  Being male and middle-aged.  Being male and having gone through menopause.  Being very overweight (obese).  Drinking alcohol, especially beer.  Not having enough water in the body (being dehydrated).  Losing weight too quickly.  Having an organ transplant.  Having lead poisoning.  Taking certain medicines.  Having kidney disease.  Having a skin condition called psoriasis. What are the signs or symptoms? An attack of acute gout usually happens in just one joint. The most common place is the big toe. Attacks often start at night. Other joints that may be affected include joints of the feet, ankle, knee, fingers, wrist, or elbow. Symptoms of an attack may include:  Very bad pain.  Warmth.  Swelling.  Stiffness.  Shiny, red, or purple skin.  Tenderness. The affected joint may be very painful to touch.  Chills and fever. Chronic gout may cause symptoms more often. More joints may be involved. You may also have white or yellow lumps (tophi) on your hands or feet or in other  areas near your joints. How is this treated?  Treatment for this condition has two phases: treating an acute attack and preventing future attacks.  Acute gout treatment may include: ? NSAIDs. ? Steroids. These are taken by mouth or injected into a joint. ? Colchicine. This medicine relieves pain and swelling. It can be given by mouth or through an IV tube.  Preventive treatment may include: ? Taking small doses of NSAIDs or colchicine daily. ? Using a medicine that reduces uric acid levels in your blood. ? Making changes to your diet. You may need to see a food expert (dietitian) about what to eat and drink to prevent gout. Follow these instructions at home: During a gout attack   If told, put ice on the painful area: ? Put ice in a plastic bag. ? Place a towel between your skin and the bag. ? Leave the ice on for 20 minutes, 2-3 times a day.  Raise (elevate) the painful joint above the level of your heart as often as you can.  Rest the joint as much as possible. If the joint is in your leg, you may be given crutches.  Follow instructions from your doctor about what you cannot eat or drink. Avoiding future gout attacks  Eat a low-purine diet. Avoid foods and drinks such as: ? Liver. ? Kidney. ? Anchovies. ? Asparagus. ? Herring. ? Mushrooms. ? Mussels. ? Beer.  Stay at a healthy weight. If you want  to lose weight, talk with your doctor. Do not lose weight too fast.  Start or continue an exercise plan as told by your doctor. Eating and drinking  Drink enough fluids to keep your pee (urine) pale yellow.  If you drink alcohol: ? Limit how much you use to:  0-1 drink a day for women.  0-2 drinks a day for men. ? Be aware of how much alcohol is in your drink. In the [place] one drink equals one 12 oz bottle of beer (355 mL), one 5 oz glass of wine (148 mL), or one 1 oz glass of hard liquor (44 mL). General instructions  Take over-the-counter and prescription  medicines only as told by your doctor.  Do not drive or use heavy machinery while taking prescription pain medicine.  Return to your normal activities as told by your doctor. Ask your doctor what activities are safe for you.  Keep all follow-up visits as told by your doctor. This is important. Contact a doctor if:  You have another gout attack.  You still have symptoms of a gout attack after 10 days of treatment.  You have problems (side effects) because of your medicines.  You have chills or a fever.  You have burning pain when you pee (urinate).  You have pain in your lower back or belly. Get help right away if:  You have very bad pain.  Your pain cannot be controlled.  You cannot pee. Summary  Gout is painful swelling of the joints.  The most common site of pain is the big toe, but it can affect other joints.  Medicines and avoiding some foods can help to prevent and treat gout attacks. This information is not intended to replace advice given to you by your health care provider. Make sure you discuss any questions you have with your health care provider. Document Revised: 04/17/2018 Document Reviewed: 04/17/2018 Elsevier Patient Education  [place].

## 2019-12-19 NOTE — Progress Notes (Signed)
BP 128/72 (BP Location: Left Arm, Patient Position: Sitting)   Pulse (!) 57   Temp 97.7 F (36.5 C) (Oral)   SpO2 98%    Subjective:    Patient ID: William Murray, male    DOB: [birth year], 69 y.o.   MRN: [id number]  HPI: William Murray is a 69 y.o. male  Chief Complaint  Patient presents with  . Gout   HYPERTENSION Continues on Losartan 100 MG daily.   Hypertension status: stable  Satisfied with current treatment? yes Duration of hypertension: chronic BP monitoring frequency:  daily BP range: 130/70's on average at home BP medication side effects:  no Medication compliance: good compliance Aspirin: yes Recurrent headaches: no Visual changes: no Palpitations: no Dyspnea: no Chest pain: no Lower extremity edema: no Dizzy/lightheaded: no  GOUT Recently started Allopurinol due to elevated uric acid levels and joint pain.  Recent level 5.5.  States pain is improving -- has some but improved.   Duration:chronic Swelling: no Redness: no Trauma: no Recent dietary change or indiscretion: down to one mixed drink a night, has decreased Fevers: no Nausea/vomiting: no Aggravating factors: diet indiscretions Alleviating factors: Allopurinol Status:  stable Treatments attempted: Allopurinol  Relevant past medical, surgical, family and social history reviewed and updated as indicated. Interim medical history since our last visit reviewed. Allergies and medications reviewed and updated.  Review of Systems  Constitutional: Negative for activity change, diaphoresis, fatigue and fever.  Respiratory: Negative for cough, chest tightness, shortness of breath and wheezing.   Cardiovascular: Negative for chest pain, palpitations and leg swelling.  Gastrointestinal: Negative.   Musculoskeletal: Negative for arthralgias and joint swelling.  Neurological: Negative.   Psychiatric/Behavioral: Negative.     Per HPI unless specifically indicated above     Objective:    BP  128/72 (BP Location: Left Arm, Patient Position: Sitting)   Pulse (!) 57   Temp 97.7 F (36.5 C) (Oral)   SpO2 98%   Wt Readings from Last 3 Encounters:  05/19/19 201 lb (91.2 kg)  05/14/19 200 lb (90.7 kg)  04/10/19 200 lb (90.7 kg)    Physical Exam Vitals and nursing note reviewed.  Constitutional:      General: He is awake. He is not in acute distress.    Appearance: He is well-developed. He is obese. He is not ill-appearing.  HENT:     Head: Normocephalic and atraumatic.     Right Ear: Hearing normal. No drainage.     Left Ear: Hearing normal. No drainage.  Eyes:     General: Lids are normal.        Right eye: No discharge.        Left eye: No discharge.     Conjunctiva/sclera: Conjunctivae normal.     Pupils: Pupils are equal, round, and reactive to light.  Neck:     Vascular: No carotid bruit.  Cardiovascular:     Rate and Rhythm: Normal rate and regular rhythm.     Heart sounds: Normal heart sounds, S1 normal and S2 normal. No murmur. No gallop.   Pulmonary:     Effort: Pulmonary effort is normal. No accessory muscle usage or respiratory distress.     Breath sounds: Normal breath sounds.  Abdominal:     General: Bowel sounds are normal.     Palpations: Abdomen is soft.  Musculoskeletal:        General: Normal range of motion.     Right hand: Normal.     Left hand:  Normal.     Cervical back: Normal range of motion and neck supple.     Right lower leg: No edema.     Left lower leg: No edema.  Skin:    General: Skin is warm and dry.  Neurological:     Mental Status: He is alert and oriented to person, place, and time.  Psychiatric:        Mood and Affect: Mood normal.        Behavior: Behavior normal. Behavior is cooperative.        Thought Content: Thought content normal.        Judgment: Judgment normal.    Results for orders placed or performed in visit on 11/21/19  Uric acid  Result Value Ref Range   Uric Acid 5.5 3.8 - 8.4 mg/dL  Comprehensive  metabolic panel  Result Value Ref Range   Glucose 94 65 - 99 mg/dL   BUN 14 8 - 27 mg/dL   Creatinine, Ser 1.19 0.76 - 1.27 mg/dL   GFR calc non Af Amer 84 >59 mL/min/1.73   GFR calc Af Amer 97 >59 mL/min/1.73   BUN/Creatinine Ratio 15 10 - 24   Sodium 142 134 - 144 mmol/L   Potassium 4.2 3.5 - 5.2 mmol/L   Chloride 104 96 - 106 mmol/L   CO2 21 20 - 29 mmol/L   Calcium 9.6 8.6 - 10.2 mg/dL   Total Protein 7.3 6.0 - 8.5 g/dL   Albumin 4.6 3.8 - 4.8 g/dL   Globulin, Total 2.7 1.5 - 4.5 g/dL   Albumin/Globulin Ratio 1.7 1.2 - 2.2   Bilirubin Total 0.7 0.0 - 1.2 mg/dL   Alkaline Phosphatase 85 39 - 117 IU/L   AST 24 0 - 40 IU/L   ALT 14 0 - 44 IU/L  Lipid Panel w/o Chol/HDL Ratio  Result Value Ref Range   Cholesterol, Total 114 100 - 199 mg/dL   Triglycerides 147 (H) 0 - 149 mg/dL   HDL 35 (L) >82 mg/dL   VLDL Cholesterol Cal 28 5 - 40 mg/dL   LDL Chol Calc (NIH) 51 0 - 99 mg/dL      Assessment & Plan:   Problem List Items Addressed This Visit      Cardiovascular and Mediastinum   Essential hypertension, benign - Primary    Chronic, ongoing with initial BP in office today elevated, but repeat below goal and home BP at goal. Continue Losartan 100 MG daily and add additional medication as needed, try to avoid HCTZ due to gout history. Could consider Amlodipine.  Continue to monitor BP daily at home.  DASH diet at home.  Return in 6 months.  BMP today.      Relevant Orders   Basic metabolic panel     Other   Chronic gout    Chronic, stable with addition of Allopurinol 100 MG daily, can adjust doses as needed.  Recheck uric acid level today and treat flares as needed.  Recommend monitoring diet and continue to cut back on alcohol use.      Relevant Orders   Uric acid       Follow up plan: Return in about 6 months (around 06/20/2020) for HTN/HLD, COPD, Gout.

## 2019-12-20 LAB — BASIC METABOLIC PANEL
BUN/Creatinine Ratio: 16 (ref 10–24)
BUN: 14 mg/dL (ref 8–27)
CO2: 19 mmol/L — ABNORMAL LOW (ref 20–29)
Calcium: 9.2 mg/dL (ref 8.6–10.2)
Chloride: 107 mmol/L — ABNORMAL HIGH (ref 96–106)
Creatinine, Ser: 0.86 mg/dL (ref 0.76–1.27)
GFR calc Af Amer: 102 mL/min/{1.73_m2} (ref >59)
GFR calc non Af Amer: 88 mL/min/{1.73_m2} (ref >59)
Glucose: 101 mg/dL — ABNORMAL HIGH (ref 65–99)
Potassium: 4.1 mmol/L (ref 3.5–5.2)
Sodium: 142 mmol/L (ref 134–144)

## 2019-12-20 LAB — URIC ACID: Uric Acid: 5 mg/dL (ref 3.8–8.4)

## 2019-12-20 NOTE — Progress Notes (Signed)
Good morning please let William Murray know his labs have returned and no concerns.  Uric acid level continues to improve, continue Allopurinol.  Kidney function remains stable.  Have a great day!!

## 2020-02-06 ENCOUNTER — Encounter: Payer: Self-pay | Admitting: Nurse Practitioner

## 2020-02-06 ENCOUNTER — Other Ambulatory Visit: Payer: Self-pay

## 2020-02-06 ENCOUNTER — Ambulatory Visit (INDEPENDENT_AMBULATORY_CARE_PROVIDER_SITE_OTHER): Payer: Medicare Other | Admitting: Nurse Practitioner

## 2020-02-06 VITALS — BP 144/69 | HR 62 | Temp 98.0°F | Wt 202.6 lb

## 2020-02-06 DIAGNOSIS — M545 Low back pain, unspecified: Secondary | ICD-10-CM | POA: Insufficient documentation

## 2020-02-06 DIAGNOSIS — G8929 Other chronic pain: Secondary | ICD-10-CM | POA: Insufficient documentation

## 2020-02-06 LAB — MICROSCOPIC EXAMINATION
Bacteria, UA: NONE SEEN
WBC, UA: NONE SEEN /hpf (ref 0–5)

## 2020-02-06 LAB — UA/M W/RFLX CULTURE, ROUTINE
Bilirubin, UA: NEGATIVE
Glucose, UA: NEGATIVE
Ketones, UA: NEGATIVE
Leukocytes,UA: NEGATIVE
Nitrite, UA: NEGATIVE
Protein,UA: NEGATIVE
Specific Gravity, UA: <1.005 — ABNORMAL LOW (ref 1.005–1.030)
Urobilinogen, Ur: 0.2 mg/dL (ref 0.2–1.0)
pH, UA: 6.5 (ref 5.0–7.5)

## 2020-02-06 MED ORDER — TAMSULOSIN HCL 0.4 MG PO CAPS
0.4000 mg | ORAL_CAPSULE | Freq: Every day | ORAL | 0 refills | Status: DC
Start: 2020-02-06 — End: 2020-02-28

## 2020-02-06 MED ORDER — TIZANIDINE HCL 4 MG PO TABS
4.0000 mg | ORAL_TABLET | Freq: Four times a day (QID) | ORAL | 0 refills | Status: DC | PRN
Start: 2020-02-06 — End: 2020-02-13

## 2020-02-06 NOTE — Assessment & Plan Note (Signed)
Acute x 2 days.  No red flag symptoms or findings on exam.  UA with trace blood, but remainder unremarkable.  Spasm noted to right lower back during exam.  Suspect musculoskeletal vs kidney stone, at this time will treat with Flomax and Tizanidine, scripts sent to pharmacy.  Recommend he continue to use Tylenol as needed + Icy/Hot Lidocaine patches.  Rest over weekend and alternate ice and heat.  No heavy lifting for at least 48 hours.  Discussed red flag symptoms and if present to immediately go to ER.  Return to office next week for follow-up.

## 2020-02-06 NOTE — Progress Notes (Signed)
BP (!) 144/69   Pulse 62   Temp 98 F (36.7 C) (Oral)   Wt 202 lb 9.6 oz (91.9 kg)   SpO2 96%   BMI 29.92 kg/m    Subjective:    Patient ID: William Murray, male    DOB: [birth year], 69 y.o.   MRN: [id number]  HPI: William Murray is a 69 y.o. male  Chief Complaint  Patient presents with  . Back Pain    pt states the left side of his back has been hurting for the past 2 days, states he does not remember doing anything to hurt himself    BACK PAIN Started a couple days ago to right flank.  Has been working outdoors, but does not know of anything he did to hurt back.  He does have a history of kidney stones in his 20's, but none since then.  Does endorse occasionally some nausea with pain, but not consistent.  Getting out of a chair is difficult at this time.   Duration: days Mechanism of injury: unknown Location: Right and low back Onset: sudden Severity: 10/10 Quality: sharp and aching -- spasm feeling Frequency: intermittent -- but when present stays for 15 minutes or more Radiation: none Aggravating factors: movement and bending Alleviating factors: ice and APAP Status: fluctuating Treatments attempted: Icy/Hot, APAP and OMM  Relief with NSAIDs?: No NSAIDs Taken Nighttime pain:  no Paresthesias / decreased sensation:  no Bowel / bladder incontinence:  no Fevers:  no Dysuria / urinary frequency:  no  Relevant past medical, surgical, family and social history reviewed and updated as indicated. Interim medical history since our last visit reviewed. Allergies and medications reviewed and updated.  Review of Systems  Constitutional: Negative for activity change, diaphoresis, fatigue and fever.  Respiratory: Negative for cough, chest tightness, shortness of breath and wheezing.   Cardiovascular: Negative for chest pain, palpitations and leg swelling.  Gastrointestinal: Negative.   Genitourinary: Negative.   Musculoskeletal: Positive for back pain.  Skin: Negative for  rash.  Neurological: Negative.   Psychiatric/Behavioral: Negative.     Per HPI unless specifically indicated above     Objective:    BP (!) 144/69   Pulse 62   Temp 98 F (36.7 C) (Oral)   Wt 202 lb 9.6 oz (91.9 kg)   SpO2 96%   BMI 29.92 kg/m   Wt Readings from Last 3 Encounters:  02/06/20 202 lb 9.6 oz (91.9 kg)  05/19/19 201 lb (91.2 kg)  05/14/19 200 lb (90.7 kg)    Physical Exam Vitals and nursing note reviewed.  Constitutional:      General: He is awake. He is not in acute distress.    Appearance: He is well-developed and overweight. He is not ill-appearing.  HENT:     Head: Normocephalic and atraumatic.     Right Ear: Hearing normal. No drainage.     Left Ear: Hearing normal. No drainage.  Eyes:     General: Lids are normal.        Right eye: No discharge.        Left eye: No discharge.     Conjunctiva/sclera: Conjunctivae normal.     Pupils: Pupils are equal, round, and reactive to light.  Neck:     Thyroid: No thyromegaly.     Vascular: No carotid bruit.  Cardiovascular:     Rate and Rhythm: Normal rate and regular rhythm.     Heart sounds: Normal heart sounds, S1 normal and S2  normal. No murmur. No gallop.   Pulmonary:     Effort: Pulmonary effort is normal. No accessory muscle usage or respiratory distress.     Breath sounds: Normal breath sounds.  Abdominal:     General: Bowel sounds are normal. There is no distension.     Palpations: Abdomen is soft. There is no hepatomegaly or splenomegaly.     Tenderness: There is no abdominal tenderness. There is no right CVA tenderness or left CVA tenderness.     Hernia: No hernia is present.  Musculoskeletal:     Cervical back: Normal range of motion and neck supple.     Lumbar back: Spasms and tenderness present. No swelling, edema or bony tenderness. Decreased range of motion. Negative right straight leg raise test and negative left straight leg raise test.     Right lower leg: No edema.     Left lower leg:  No edema.  Skin:    General: Skin is warm and dry.     Capillary Refill: Capillary refill takes less than 2 seconds.     Findings: No rash.  Neurological:     Mental Status: He is alert and oriented to person, place, and time.     Deep Tendon Reflexes: Reflexes are normal and symmetric.  Psychiatric:        Attention and Perception: Attention normal.        Mood and Affect: Mood normal.        Speech: Speech normal.        Behavior: Behavior normal. Behavior is cooperative.        Thought Content: Thought content normal.    Back Exam:    Inspection:  Normal spinal curvature.  No deformity, ecchymosis, erythema, or lesions   Curvature: Normal   Deformity: no  Ecchymosis: none  Erythema:  none  Lesions: no    Palpation:     Midline spinal tenderness: no none                  Paralumbar tenderness: yes Right     Parathoracic tenderness: no none     Buttocks tenderness: nonone     Range of Motion:      Flexion: Fingers to Knees     Extension:Decreased     Lateral bending:Decreased    Rotation:Normal    Neuro Exam:Lower extremity DTRs normal & symmetric.  Strength and sensation intact.     Patellar DTRs: Normal     Ankle dorsiflexion strength:Within Normal Limits     Sensation(medial malleolus):Within Normal Limits    Special Tests:      Straight leg raise:negative     Crossed straight leg raise:negative     Stork test: negative  Results for orders placed or performed in visit on 12/19/19  Uric acid  Result Value Ref Range   Uric Acid 5.0 3.8 - 8.4 mg/dL  Basic metabolic panel  Result Value Ref Range   Glucose 101 (H) 65 - 99 mg/dL   BUN 14 8 - 27 mg/dL   Creatinine, Ser 0.86 0.76 - 1.27 mg/dL   GFR calc non Af Amer 88 >59 mL/min/1.73   GFR calc Af Amer 102 >59 mL/min/1.73   BUN/Creatinine Ratio 16 10 - 24   Sodium 142 134 - 144 mmol/L   Potassium 4.1 3.5 - 5.2 mmol/L   Chloride 107 (H) 96 - 106 mmol/L   CO2 19 (L) 20 - 29 mmol/L   Calcium 9.2 8.6 - 10.2 mg/dL  Assessment & Plan:   Problem List Items Addressed This Visit      Other   Acute left-sided low back pain - Primary    Acute x 2 days.  No red flag symptoms or findings on exam.  UA with trace blood, but remainder unremarkable.  Spasm noted to right lower back during exam.  Suspect musculoskeletal vs kidney stone, at this time will treat with Flomax and Tizanidine, scripts sent to pharmacy.  Recommend he continue to use Tylenol as needed + Icy/Hot Lidocaine patches.  Rest over weekend and alternate ice and heat.  No heavy lifting for at least 48 hours.  Discussed red flag symptoms and if present to immediately go to ER.  Return to office next week for follow-up.      Relevant Medications   tiZANidine (ZANAFLEX) 4 MG tablet   Other Relevant Orders   UA/M w/rflx Culture, Routine       Follow up plan: Return in about 1 week (around 02/13/2020) for Back pain.

## 2020-02-06 NOTE — Patient Instructions (Signed)
Acute Back Pain, Adult Acute back pain is sudden and usually short-lived. It is often caused by an injury to the muscles and tissues in the back. The injury may result from:  A muscle or ligament getting overstretched or torn (strained). Ligaments are tissues that connect bones to each other. Lifting something improperly can cause a back strain.  Wear and tear (degeneration) of the spinal disks. Spinal disks are circular tissue that provides cushioning between the bones of the spine (vertebrae).  Twisting motions, such as while playing sports or doing yard work.  A hit to the back.  Arthritis. You may have a physical exam, lab tests, and imaging tests to find the cause of your pain. Acute back pain usually goes away with rest and home care. Follow these instructions at home: Managing pain, stiffness, and swelling  Take over-the-counter and prescription medicines only as told by your health care provider.  Your health care provider may recommend applying ice during the first 24-48 hours after your pain starts. To do this: ? Put ice in a plastic bag. ? Place a towel between your skin and the bag. ? Leave the ice on for 20 minutes, 2-3 times a day.  If directed, apply heat to the affected area as often as told by your health care provider. Use the heat source that your health care provider recommends, such as a moist heat pack or a heating pad. ? Place a towel between your skin and the heat source. ? Leave the heat on for 20-30 minutes. ? Remove the heat if your skin turns bright red. This is especially important if you are unable to feel pain, heat, or cold. You have a greater risk of getting burned. Activity   Do not stay in bed. Staying in bed for more than 1-2 days can delay your recovery.  Sit up and stand up straight. Avoid leaning forward when you sit, or hunching over when you stand. ? If you work at a desk, sit close to it so you do not need to lean over. Keep your chin tucked  in. Keep your neck drawn back, and keep your elbows bent at a right angle. Your arms should look like the letter "L." ? Sit high and close to the steering wheel when you drive. Add lower back (lumbar) support to your car seat, if needed.  Take short walks on even surfaces as soon as you are able. Try to increase the length of time you walk each day.  Do not sit, drive, or stand in one place for more than 30 minutes at a time. Sitting or standing for long periods of time can put stress on your back.  Do not drive or use heavy machinery while taking prescription pain medicine.  Use proper lifting techniques. When you bend and lift, use positions that put less stress on your back: ? Bend your knees. ? Keep the load close to your body. ? Avoid twisting.  Exercise regularly as told by your health care provider. Exercising helps your back heal faster and helps prevent back injuries by keeping muscles strong and flexible.  Work with a physical therapist to make a safe exercise program, as recommended by your health care provider. Do any exercises as told by your physical therapist. Lifestyle  Maintain a healthy weight. Extra weight puts stress on your back and makes it difficult to have good posture.  Avoid activities or situations that make you feel anxious or stressed. Stress and anxiety increase muscle   tension and can make back pain worse. Learn ways to manage anxiety and stress, such as through exercise. General instructions  Sleep on a firm mattress in a comfortable position. Try lying on your side with your knees slightly bent. If you lie on your back, put a pillow under your knees.  Follow your treatment plan as told by your health care provider. This may include: ? Cognitive or behavioral therapy. ? Acupuncture or massage therapy. ? Meditation or yoga. Contact a health care provider if:  You have pain that is not relieved with rest or medicine.  You have increasing pain going down  into your legs or buttocks.  Your pain does not improve after 2 weeks.  You have pain at night.  You lose weight without trying.  You have a fever or chills. Get help right away if:  You develop new bowel or bladder control problems.  You have unusual weakness or numbness in your arms or legs.  You develop nausea or vomiting.  You develop abdominal pain.  You feel faint. Summary  Acute back pain is sudden and usually short-lived.  Use proper lifting techniques. When you bend and lift, use positions that put less stress on your back.  Take over-the-counter and prescription medicines and apply heat or ice as directed by your health care provider. This information is not intended to replace advice given to you by your health care provider. Make sure you discuss any questions you have with your health care provider. Document Revised: 01/14/2019 Document Reviewed: 05/09/2017 Elsevier Patient Education  2020 [employer]  

## 2020-02-13 ENCOUNTER — Other Ambulatory Visit: Payer: Self-pay | Admitting: Nurse Practitioner

## 2020-02-13 ENCOUNTER — Other Ambulatory Visit: Payer: Self-pay

## 2020-02-13 ENCOUNTER — Encounter: Payer: Self-pay | Admitting: Nurse Practitioner

## 2020-02-13 ENCOUNTER — Ambulatory Visit (INDEPENDENT_AMBULATORY_CARE_PROVIDER_SITE_OTHER): Payer: Medicare Other | Admitting: Nurse Practitioner

## 2020-02-13 VITALS — BP 136/64 | HR 61 | Temp 98.3°F | Wt 203.6 lb

## 2020-02-13 DIAGNOSIS — M545 Low back pain, unspecified: Secondary | ICD-10-CM

## 2020-02-13 LAB — UA/M W/RFLX CULTURE, ROUTINE
Bilirubin, UA: NEGATIVE
Glucose, UA: NEGATIVE
Ketones, UA: NEGATIVE
Leukocytes,UA: NEGATIVE
Nitrite, UA: NEGATIVE
Protein,UA: NEGATIVE
Specific Gravity, UA: 1.015 (ref 1.005–1.030)
Urobilinogen, Ur: 0.2 mg/dL (ref 0.2–1.0)
pH, UA: 5 (ref 5.0–7.5)

## 2020-02-13 LAB — MICROSCOPIC EXAMINATION
Bacteria, UA: NONE SEEN
WBC, UA: NONE SEEN /hpf (ref 0–5)

## 2020-02-13 NOTE — Telephone Encounter (Signed)
Requested medication (s) are due for refill today:  No  Requested medication (s) are on the active medication list:  yes  Future visit scheduled:  yes  Last Refill: 02/06/20; #60; no refills  Note to clinic: Medication is not delegated  Requested Prescriptions  Pending Prescriptions Disp Refills   tiZANidine (ZANAFLEX) 4 MG tablet [Pharmacy Med Name: TIZANIDINE HCL 4 MG TABLET] 60 tablet 0    Sig: Take 1 tablet (4 mg total) by mouth every 6 (six) hours as needed for muscle spasms.      Not Delegated - Cardiovascular:  Alpha-2 Agonists - tizanidine Failed - 02/13/2020  2:31 PM      Failed - This refill cannot be delegated      Passed - Valid encounter within last 6 months    Recent Outpatient Visits           1 week ago Acute left-sided low back pain without sciatica   [facility] [place], Dorie Rank, NP   1 month ago Essential hypertension, benign   [facility] [place], [place] T, NP   2 months ago Aortic atherosclerosis (HCC)   [facility] [place], Dorie Rank, NP   3 months ago Essential hypertension, benign   [facility] [place], [place] T, NP   4 months ago Pain in both hands   [facility] [place], Dorie Rank, NP       Future Appointments             Today Marjie Skiff, NP [facility]   In 4 months [place], Dorie Rank, NP [facility], [facility]

## 2020-02-13 NOTE — Assessment & Plan Note (Signed)
Acute and improved, will continue Tizanidine as needed only.  Recheck urine today to assess trace blood noted on previous check.  Recommend continued use of Tylenol at home as needed + Icy/Hot or Voltaren gel.  Alternate heat and ice as needed.  Return to office for return of symptoms.

## 2020-02-13 NOTE — Telephone Encounter (Signed)
LOV- 5.7.2021

## 2020-02-13 NOTE — Patient Instructions (Signed)
Acute Back Pain, Adult Acute back pain is sudden and usually short-lived. It is often caused by an injury to the muscles and tissues in the back. The injury may result from:  A muscle or ligament getting overstretched or torn (strained). Ligaments are tissues that connect bones to each other. Lifting something improperly can cause a back strain.  Wear and tear (degeneration) of the spinal disks. Spinal disks are circular tissue that provides cushioning between the bones of the spine (vertebrae).  Twisting motions, such as while playing sports or doing yard work.  A hit to the back.  Arthritis. You may have a physical exam, lab tests, and imaging tests to find the cause of your pain. Acute back pain usually goes away with rest and home care. Follow these instructions at home: Managing pain, stiffness, and swelling  Take over-the-counter and prescription medicines only as told by your health care provider.  Your health care provider may recommend applying ice during the first 24-48 hours after your pain starts. To do this: ? Put ice in a plastic bag. ? Place a towel between your skin and the bag. ? Leave the ice on for 20 minutes, 2-3 times a day.  If directed, apply heat to the affected area as often as told by your health care provider. Use the heat source that your health care provider recommends, such as a moist heat pack or a heating pad. ? Place a towel between your skin and the heat source. ? Leave the heat on for 20-30 minutes. ? Remove the heat if your skin turns bright red. This is especially important if you are unable to feel pain, heat, or cold. You have a greater risk of getting burned. Activity   Do not stay in bed. Staying in bed for more than 1-2 days can delay your recovery.  Sit up and stand up straight. Avoid leaning forward when you sit, or hunching over when you stand. ? If you work at a desk, sit close to it so you do not need to lean over. Keep your chin tucked  in. Keep your neck drawn back, and keep your elbows bent at a right angle. Your arms should look like the letter "L." ? Sit high and close to the steering wheel when you drive. Add lower back (lumbar) support to your car seat, if needed.  Take short walks on even surfaces as soon as you are able. Try to increase the length of time you walk each day.  Do not sit, drive, or stand in one place for more than 30 minutes at a time. Sitting or standing for long periods of time can put stress on your back.  Do not drive or use heavy machinery while taking prescription pain medicine.  Use proper lifting techniques. When you bend and lift, use positions that put less stress on your back: ? Bend your knees. ? Keep the load close to your body. ? Avoid twisting.  Exercise regularly as told by your health care provider. Exercising helps your back heal faster and helps prevent back injuries by keeping muscles strong and flexible.  Work with a physical therapist to make a safe exercise program, as recommended by your health care provider. Do any exercises as told by your physical therapist. Lifestyle  Maintain a healthy weight. Extra weight puts stress on your back and makes it difficult to have good posture.  Avoid activities or situations that make you feel anxious or stressed. Stress and anxiety increase muscle   tension and can make back pain worse. Learn ways to manage anxiety and stress, such as through exercise. General instructions  Sleep on a firm mattress in a comfortable position. Try lying on your side with your knees slightly bent. If you lie on your back, put a pillow under your knees.  Follow your treatment plan as told by your health care provider. This may include: ? Cognitive or behavioral therapy. ? Acupuncture or massage therapy. ? Meditation or yoga. Contact a health care provider if:  You have pain that is not relieved with rest or medicine.  You have increasing pain going down  into your legs or buttocks.  Your pain does not improve after 2 weeks.  You have pain at night.  You lose weight without trying.  You have a fever or chills. Get help right away if:  You develop new bowel or bladder control problems.  You have unusual weakness or numbness in your arms or legs.  You develop nausea or vomiting.  You develop abdominal pain.  You feel faint. Summary  Acute back pain is sudden and usually short-lived.  Use proper lifting techniques. When you bend and lift, use positions that put less stress on your back.  Take over-the-counter and prescription medicines and apply heat or ice as directed by your health care provider. This information is not intended to replace advice given to you by your health care provider. Make sure you discuss any questions you have with your health care provider. Document Revised: 01/14/2019 Document Reviewed: 05/09/2017 Elsevier Patient Education  2020 [employer]  

## 2020-02-13 NOTE — Progress Notes (Signed)
BP 136/64 (BP Location: Left Arm)   Pulse 61   Temp 98.3 F (36.8 C) (Oral)   Wt 203 lb 9.6 oz (92.4 kg)   SpO2 97%   BMI 30.07 kg/m    Subjective:    Patient ID: William Murray, male    DOB: 1950-10-22, 69 y.o.   MRN: [id number]  HPI: William Murray is a 69 y.o. male  Chief Complaint  Patient presents with  . Back Pain    1 week f/up- pt states he can move a lot better   BACK PAIN Seen 01/15/20 for back pain and Tizanidine prescribed.  Had been working outdoors, but does not know of anything he did to hurt back.  Reports it is a little sore on and off, but overall can move a lot better.  Reports Tizanidine has been helping him before bed on occasion, takes only if back is bothering him .  Did have trace blood on UA at visit, no other findings.  Has history of kidney stones in 20's.   Severity: 0/10 Quality: sharp and aching -- spasm feeling Alleviating factors: ice and APAP, Tizanidine Status: fluctuating Treatments attempted: Icy/Hot, APAP and Tizanidine Relief with NSAIDs?: No NSAIDs Taken Nighttime pain:  no Paresthesias / decreased sensation:  no Bowel / bladder incontinence:  no Fevers:  no Dysuria / urinary frequency:  no  Relevant past medical, surgical, family and social history reviewed and updated as indicated. Interim medical history since our last visit reviewed. Allergies and medications reviewed and updated.  Review of Systems  Constitutional: Negative for activity change, diaphoresis, fatigue and fever.  Respiratory: Negative for cough, chest tightness, shortness of breath and wheezing.   Cardiovascular: Negative for chest pain, palpitations and leg swelling.  Gastrointestinal: Negative.   Genitourinary: Negative.   Musculoskeletal: Positive for back pain.  Skin: Negative for rash.  Neurological: Negative.   Psychiatric/Behavioral: Negative.     Per HPI unless specifically indicated above     Objective:    BP 136/64 (BP Location: Left Arm)    Pulse 61   Temp 98.3 F (36.8 C) (Oral)   Wt 203 lb 9.6 oz (92.4 kg)   SpO2 97%   BMI 30.07 kg/m   Wt Readings from Last 3 Encounters:  02/13/20 203 lb 9.6 oz (92.4 kg)  02/06/20 202 lb 9.6 oz (91.9 kg)  05/19/19 201 lb (91.2 kg)    Physical Exam Vitals and nursing note reviewed.  Constitutional:      General: He is awake. He is not in acute distress.    Appearance: He is well-developed and overweight. He is not ill-appearing.  HENT:     Head: Normocephalic and atraumatic.     Right Ear: Hearing normal. No drainage.     Left Ear: Hearing normal. No drainage.  Eyes:     General: Lids are normal.        Right eye: No discharge.        Left eye: No discharge.     Conjunctiva/sclera: Conjunctivae normal.     Pupils: Pupils are equal, round, and reactive to light.  Neck:     Thyroid: No thyromegaly.     Vascular: No carotid bruit.  Cardiovascular:     Rate and Rhythm: Normal rate and regular rhythm.     Heart sounds: Normal heart sounds, S1 normal and S2 normal. No murmur. No gallop.   Pulmonary:     Effort: Pulmonary effort is normal. No accessory muscle usage or  respiratory distress.     Breath sounds: Normal breath sounds.  Abdominal:     General: Bowel sounds are normal. There is no distension.     Palpations: Abdomen is soft. There is no hepatomegaly or splenomegaly.     Tenderness: There is no abdominal tenderness. There is no right CVA tenderness or left CVA tenderness.     Hernia: No hernia is present.  Musculoskeletal:     Cervical back: Normal range of motion and neck supple.     Lumbar back: No swelling, edema, spasms, tenderness or bony tenderness. Normal range of motion. Negative right straight leg raise test and negative left straight leg raise test.     Right lower leg: No edema.     Left lower leg: No edema.  Skin:    General: Skin is warm and dry.     Capillary Refill: Capillary refill takes less than 2 seconds.     Findings: No rash.  Neurological:      Mental Status: He is alert and oriented to person, place, and time.     Deep Tendon Reflexes: Reflexes are normal and symmetric.  Psychiatric:        Attention and Perception: Attention normal.        Mood and Affect: Mood normal.        Speech: Speech normal.        Behavior: Behavior normal. Behavior is cooperative.        Thought Content: Thought content normal.     Results for orders placed or performed in visit on 02/06/20  Microscopic Examination   URINE  Result Value Ref Range   WBC, UA None seen 0 - 5 /hpf   RBC 0-2 0 - 2 /hpf   Epithelial Cells (non renal) 0-10 0 - 10 /hpf   Bacteria, UA None seen None seen/Few  UA/M w/rflx Culture, Routine   Specimen: Urine   URINE  Result Value Ref Range   Specific Gravity, UA <1.005 (L) 1.005 - 1.030   pH, UA 6.5 5.0 - 7.5   Color, UA Yellow Yellow   Appearance Ur Clear Clear   Leukocytes,UA Negative Negative   Protein,UA Negative Negative/Trace   Glucose, UA Negative Negative   Ketones, UA Negative Negative   RBC, UA Trace (A) Negative   Bilirubin, UA Negative Negative   Urobilinogen, Ur 0.2 0.2 - 1.0 mg/dL   Nitrite, UA Negative Negative   Microscopic Examination See below:       Assessment & Plan:   Problem List Items Addressed This Visit      Other   Acute left-sided low back pain - Primary    Acute and improved, will continue Tizanidine as needed only.  Recheck urine today to assess trace blood noted on previous check.  Recommend continued use of Tylenol at home as needed + Icy/Hot or Voltaren gel.  Alternate heat and ice as needed.  Return to office for return of symptoms.      Relevant Orders   UA/M w/rflx Culture, Routine       Follow up plan: Return if symptoms worsen or fail to improve.

## 2020-02-24 ENCOUNTER — Other Ambulatory Visit: Payer: Self-pay | Admitting: Nurse Practitioner

## 2020-02-24 NOTE — Telephone Encounter (Signed)
Requested medication (s) are due for refill today: no  Requested medication (s) are on the active medication list:yes  Last refill:  02/17/2020  Future visit scheduled: yes  Notes to clinic:  this refill cannot be delegated    Requested Prescriptions  Pending Prescriptions Disp Refills   tiZANidine (ZANAFLEX) 4 MG tablet [Pharmacy Med Name: TIZANIDINE HCL 4 MG TABLET] 60 tablet 0    Sig: TAKE 1 TABLET (4 MG TOTAL) BY MOUTH EVERY 6 (SIX) HOURS AS NEEDED FOR MUSCLE SPASMS.      Not Delegated - Cardiovascular:  Alpha-2 Agonists - tizanidine Failed - 02/24/2020  8:33 AM      Failed - This refill cannot be delegated      Passed - Valid encounter within last 6 months    Recent Outpatient Visits           1 week ago Acute left-sided low back pain without sciatica   [facility] [place], Jolene T, NP   2 weeks ago Acute left-sided low back pain without sciatica   [facility] [place], Dorie Rank, NP   2 months ago Essential hypertension, benign   [facility] [place], [place] T, NP   3 months ago Aortic atherosclerosis (HCC)   [facility] [place], Jolene T, NP   4 months ago Essential hypertension, benign   [facility] [place], Dorie Rank, NP       Future Appointments             In 3 months [place], Dorie Rank, NP [facility]

## 2020-02-24 NOTE — Telephone Encounter (Signed)
Routing to provider  

## 2020-02-28 ENCOUNTER — Other Ambulatory Visit: Payer: Self-pay | Admitting: Nurse Practitioner

## 2020-02-28 NOTE — Telephone Encounter (Signed)
Requested Prescriptions  Pending Prescriptions Disp Refills  . tamsulosin (FLOMAX) 0.4 MG CAPS capsule [Pharmacy Med Name: TAMSULOSIN HCL 0.4 MG CAPSULE] 90 capsule 0    Sig: TAKE 1 CAPSULE BY MOUTH EVERY DAY     Urology: Alpha-Adrenergic Blocker Passed - 02/28/2020 11:33 AM      Passed - Last BP in normal range    BP Readings from Last 1 Encounters:  02/13/20 136/64         Passed - Valid encounter within last 12 months    Recent Outpatient Visits          2 weeks ago Acute left-sided low back pain without sciatica   [facility] [place], Jolene T, NP   3 weeks ago Acute left-sided low back pain without sciatica   [facility] [place], Corrie [place] T, NP   2 months ago Essential hypertension, benign   [facility] [place], [place] T, NP   3 months ago Aortic atherosclerosis (HCC)   [facility] [place], Jolene T, NP   4 months ago Essential hypertension, benign   [facility] [place], Dorie Rank, NP      Future Appointments            In 3 months [place], Dorie Rank, NP [facility]

## 2020-03-08 ENCOUNTER — Other Ambulatory Visit: Payer: Self-pay | Admitting: Nurse Practitioner

## 2020-03-09 NOTE — Telephone Encounter (Signed)
Requesting to change to a 90 day supply.  °

## 2020-03-09 NOTE — Telephone Encounter (Signed)
Requested medication (s) are due for refill today: no  Requested medication (s) are on the active medication list: yes  Last refill:  02/24/2020  Future visit scheduled: yes  Notes to clinic:  REQUEST FOR 90 DAYS PRESCRIPTION   Requested Prescriptions  Pending Prescriptions Disp Refills   tiZANidine (ZANAFLEX) 4 MG tablet [Pharmacy Med Name: TIZANIDINE HCL 4 MG TABLET] 360 tablet 1    Sig: TAKE 1 TABLET (4 MG TOTAL) BY MOUTH EVERY 6 (SIX) HOURS AS NEEDED FOR MUSCLE SPASMS.      Not Delegated - Cardiovascular:  Alpha-2 Agonists - tizanidine Failed - 03/08/2020 11:22 AM      Failed - This refill cannot be delegated      Passed - Valid encounter within last 6 months    Recent Outpatient Visits           3 weeks ago Acute left-sided low back pain without sciatica   [facility] [place], Jolene T, NP   1 month ago Acute left-sided low back pain without sciatica   [facility] [place], Dorie Rank, NP   2 months ago Essential hypertension, benign   [facility] [place], [place] T, NP   3 months ago Aortic atherosclerosis (HCC)   [facility] [place], Jolene T, NP   4 months ago Essential hypertension, benign   [facility] [place], Dorie Rank, NP       Future Appointments             In 3 months [place], Dorie Rank, NP [facility]

## 2020-04-07 ENCOUNTER — Telehealth: Payer: Self-pay

## 2020-04-07 NOTE — Telephone Encounter (Signed)
Message left notifying patient that it is time to schedule the low dose lung cancer screening CT scan.  Instructed patient to return call to Shawn Perkins at [phone] to verify information prior to CT scan being scheduled.    

## 2020-04-16 ENCOUNTER — Telehealth: Payer: Self-pay | Admitting: *Deleted

## 2020-04-16 DIAGNOSIS — Z87891 Personal history of nicotine dependence: Secondary | ICD-10-CM

## 2020-04-16 DIAGNOSIS — Z122 Encounter for screening for malignant neoplasm of respiratory organs: Secondary | ICD-10-CM

## 2020-04-16 NOTE — Telephone Encounter (Signed)
(  04/16/2020) Pt has been notified that lung cancer screening CT scan is due currently or will be in near future. Confirmed pt is within appropriate age range, and asymptomatic. Pt denies illness that would prevent curative treatment for lung cancer if found. Verified smoking history (Former Smoker since 2019, 0.25 ppd). Pt did receive 2nd COVID VX on (04/21) Pt is agreeable for CT scan being scheduled, and prefers an appt early in the morning (04/30/20 @ 8am tentatively)  [name]

## 2020-04-19 NOTE — Telephone Encounter (Signed)
Smoking history: former, quit 2019, 124 pack year

## 2020-04-19 NOTE — Addendum Note (Signed)
Addended by: Jonne Ply on: 04/19/2020 08:23 AM   Modules accepted: Orders

## 2020-04-30 ENCOUNTER — Other Ambulatory Visit: Payer: Self-pay

## 2020-04-30 ENCOUNTER — Ambulatory Visit
Admission: RE | Admit: 2020-04-30 | Discharge: 2020-04-30 | Disposition: A | Discharge status: Home / Self Care | Payer: Medicare Other | Source: Ambulatory Visit | Attending: Oncology | Admitting: Oncology

## 2020-04-30 DIAGNOSIS — Z87891 Personal history of nicotine dependence: Secondary | ICD-10-CM | POA: Diagnosis not present

## 2020-04-30 DIAGNOSIS — Z122 Encounter for screening for malignant neoplasm of respiratory organs: Secondary | ICD-10-CM | POA: Insufficient documentation

## 2020-05-04 ENCOUNTER — Encounter: Payer: Self-pay | Admitting: *Deleted

## 2020-05-14 ENCOUNTER — Ambulatory Visit (INDEPENDENT_AMBULATORY_CARE_PROVIDER_SITE_OTHER): Payer: Medicare Other

## 2020-05-14 VITALS — Ht 69.0 in | Wt 202.0 lb

## 2020-05-14 DIAGNOSIS — Z0000 Encounter for general adult medical examination without abnormal findings: Secondary | ICD-10-CM | POA: Diagnosis not present

## 2020-05-14 NOTE — Patient Instructions (Signed)
William Murray , Thank you for taking time to come for your Medicare Wellness Visit. I appreciate your ongoing commitment to your health goals. Please review the following plan we discussed and let me know if I can assist you in the future.   Screening recommendations/referrals: Colonoscopy: cologuard 05/12/2018 Recommended yearly ophthalmology/optometry visit for glaucoma screening and checkup Recommended yearly dental visit for hygiene and checkup  Vaccinations: Influenza vaccine: due Pneumococcal vaccine: completed 10/19/2014 Tdap vaccine: due Shingles vaccine: discussed   Covid-19: 12/24/2019, 12/03/2019  Advanced directives: Please bring a copy of your POA (Power of Attorney) and/or Living Will to your next appointment.   Conditions/risks identified: none  Next appointment: Follow up in one year for your annual wellness visit.   Preventive Care 19 Years and Older, Male Preventive care refers to lifestyle choices and visits with your health care provider that can promote health and wellness. What does preventive care include?  A yearly physical exam. This is also called an annual well check.  Dental exams once or twice a year.  Routine eye exams. Ask your health care provider how often you should have your eyes checked.  Personal lifestyle choices, including:  Daily care of your teeth and gums.  Regular physical activity.  Eating a healthy diet.  Avoiding tobacco and drug use.  Limiting alcohol use.  Practicing safe sex.  Taking low doses of aspirin every day.  Taking vitamin and mineral supplements as recommended by your health care provider. What happens during an annual well check? The services and screenings done by your health care provider during your annual well check will depend on your age, overall health, lifestyle risk factors, and family history of disease. Counseling  Your health care provider may ask you questions about your:  Alcohol use.  Tobacco  use.  Drug use.  Emotional well-being.  Home and relationship well-being.  Sexual activity.  Eating habits.  History of falls.  Memory and ability to understand (cognition).  Work and work Astronomer. Screening  You may have the following tests or measurements:  Height, weight, and BMI.  Blood pressure.  Lipid and cholesterol levels. These may be checked every 5 years, or more frequently if you are over 50 years old.  Skin check.  Lung cancer screening. You may have this screening every year starting at age 1 if you have a 30-pack-year history of smoking and currently smoke or have quit within the past 15 years.  Fecal occult blood test (FOBT) of the stool. You may have this test every year starting at age 21.  Flexible sigmoidoscopy or colonoscopy. You may have a sigmoidoscopy every 5 years or a colonoscopy every 10 years starting at age 67.  Prostate cancer screening. Recommendations will vary depending on your family history and other risks.  Hepatitis C blood test.  Hepatitis B blood test.  Sexually transmitted disease (STD) testing.  Diabetes screening. This is done by checking your blood sugar (glucose) after you have not eaten for a while (fasting). You may have this done every 1-3 years.  Abdominal aortic aneurysm (AAA) screening. You may need this if you are a current or former smoker.  Osteoporosis. You may be screened starting at age 44 if you are at high risk. Talk with your health care provider about your test results, treatment options, and if necessary, the need for more tests. Vaccines  Your health care provider may recommend certain vaccines, such as:  Influenza vaccine. This is recommended every year.  Tetanus, diphtheria, and  acellular pertussis (Tdap, Td) vaccine. You may need a Td booster every 10 years.  Zoster vaccine. You may need this after age 6.  Pneumococcal 13-valent conjugate (PCV13) vaccine. One dose is recommended after age  26.  Pneumococcal polysaccharide (PPSV23) vaccine. One dose is recommended after age 52. Talk to your health care provider about which screenings and vaccines you need and how often you need them. This information is not intended to replace advice given to you by your health care provider. Make sure you discuss any questions you have with your health care provider. Document Released: 10/22/2015 Document Revised: 06/14/2016 Document Reviewed: 07/27/2015 Elsevier Interactive Patient Education  2017 [place] Prevention in the Home Falls can cause injuries. They can happen to people of all ages. There are many things you can do to make your home safe and to help prevent falls. What can I do on the outside of my home?  Regularly fix the edges of walkways and driveways and fix any cracks.  Remove anything that might make you trip as you walk through a door, such as a raised step or threshold.  Trim any bushes or trees on the path to your home.  Use bright outdoor lighting.  Clear any walking paths of anything that might make someone trip, such as rocks or tools.  Regularly check to see if handrails are loose or broken. Make sure that both sides of any steps have handrails.  Any raised decks and porches should have guardrails on the edges.  Have any leaves, snow, or ice cleared regularly.  Use sand or salt on walking paths during winter.  Clean up any spills in your garage right away. This includes oil or grease spills. What can I do in the bathroom?  Use night lights.  Install grab bars by the toilet and in the tub and shower. Do not use towel bars as grab bars.  Use non-skid mats or decals in the tub or shower.  If you need to sit down in the shower, use a plastic, non-slip stool.  Keep the floor dry. Clean up any water that spills on the floor as soon as it happens.  Remove soap buildup in the tub or shower regularly.  Attach bath mats securely with double-sided  non-slip rug tape.  Do not have throw rugs and other things on the floor that can make you trip. What can I do in the bedroom?  Use night lights.  Make sure that you have a light by your bed that is easy to reach.  Do not use any sheets or blankets that are too big for your bed. They should not hang down onto the floor.  Have a firm chair that has side arms. You can use this for support while you get dressed.  Do not have throw rugs and other things on the floor that can make you trip. What can I do in the kitchen?  Clean up any spills right away.  Avoid walking on wet floors.  Keep items that you use a lot in easy-to-reach places.  If you need to reach something above you, use a strong step stool that has a grab bar.  Keep electrical cords out of the way.  Do not use floor polish or wax that makes floors slippery. If you must use wax, use non-skid floor wax.  Do not have throw rugs and other things on the floor that can make you trip. What can I do with my stairs?  Do not leave any items on the stairs.  Make sure that there are handrails on both sides of the stairs and use them. Fix handrails that are broken or loose. Make sure that handrails are as long as the stairways.  Check any carpeting to make sure that it is firmly attached to the stairs. Fix any carpet that is loose or worn.  Avoid having throw rugs at the top or bottom of the stairs. If you do have throw rugs, attach them to the floor with carpet tape.  Make sure that you have a light switch at the top of the stairs and the bottom of the stairs. If you do not have them, ask someone to add them for you. What else can I do to help prevent falls?  Wear shoes that:  Do not have high heels.  Have rubber bottoms.  Are comfortable and fit you well.  Are closed at the toe. Do not wear sandals.  If you use a stepladder:  Make sure that it is fully opened. Do not climb a closed stepladder.  Make sure that both  sides of the stepladder are locked into place.  Ask someone to hold it for you, if possible.  Clearly mark and make sure that you can see:  Any grab bars or handrails.  First and last steps.  Where the edge of each step is.  Use tools that help you move around (mobility aids) if they are needed. These include:  Canes.  Walkers.  Scooters.  Crutches.  Turn on the lights when you go into a dark area. Replace any light bulbs as soon as they burn out.  Set up your furniture so you have a clear path. Avoid moving your furniture around.  If any of your floors are uneven, fix them.  If there are any pets around you, be aware of where they are.  Review your medicines with your doctor. Some medicines can make you feel dizzy. This can increase your chance of falling. Ask your doctor what other things that you can do to help prevent falls. This information is not intended to replace advice given to you by your health care provider. Make sure you discuss any questions you have with your health care provider. Document Released: 07/22/2009 Document Revised: 03/02/2016 Document Reviewed: 10/30/2014 Elsevier Interactive Patient Education  2017 [employer].

## 2020-05-14 NOTE — Progress Notes (Addendum)
I connected with William Murray today by telephone and verified that I am speaking with the correct person using two identifiers. Location patient: home Location provider: work Persons participating in the virtual visit: Engelbert, Sevin LPN.   I discussed the limitations, risks, security and privacy concerns of performing an evaluation and management service by telephone and the availability of in person appointments. I also discussed with the patient that there may be a patient responsible charge related to this service. The patient expressed understanding and verbally consented to this telephonic visit.    Interactive audio and video telecommunications were attempted between this provider and patient, however failed, due to patient having technical difficulties OR patient did not have access to video capability.  We continued and completed visit with audio only.    Vital signs may be patient reported or missing.  Subjective:   William Murray is a 69 y.o. male who presents for Medicare Annual/Subsequent preventive examination.  Review of Systems     Cardiac Risk Factors include: advanced age (>77men, >42 women);hypertension;male gender;sedentary lifestyle     Objective:    Today's Vitals   05/14/20 1026 05/14/20 1027  Weight: 202 lb (91.6 kg)   Height:  (1.753 m)   PainSc:  8    Body mass index is 29.83 kg/m.  Advanced Directives 05/14/2020 05/14/2019 05/08/2018 05/04/2017 01/16/2017 04/28/2015 04/28/2015  Does Patient Have a Medical Advance Directive? Yes Yes No No No No No  Type of Estate agent of [place];Living will Living will;Healthcare Power of Attorney - - - - -  Copy of Healthcare Power of Attorney in Chart? No - copy requested Yes - validated most recent copy scanned in chart (See row information) - - - - -  Would patient like information on creating a medical advance directive? - - Yes (MAU/Ambulatory/Procedural Areas - Information  given) Yes (MAU/Ambulatory/Procedural Areas - Information given) - Yes - Educational materials given Yes - Educational materials given    Current Medications (verified) Outpatient Encounter Medications as of 05/14/2020  Medication Sig  . acetaminophen (TYLENOL) 500 MG tablet Take 500 mg by mouth every 6 (six) hours as needed.  Marland Kitchen albuterol (PROVENTIL HFA;VENTOLIN HFA) 108 (90 Base) MCG/ACT inhaler Inhale 2 puffs into the lungs every 6 (six) hours as needed for wheezing or shortness of breath.  . allopurinol (ZYLOPRIM) 100 MG tablet Take 1 tablet (100 mg total) by mouth daily.  Marland Kitchen aspirin 81 MG tablet Take 1 tablet (81 mg total) by mouth daily.  Marland Kitchen atorvastatin (LIPITOR) 10 MG tablet Take 1 tablet (10 mg total) by mouth daily at 6 PM.  . losartan (COZAAR) 100 MG tablet Take 1 tablet (100 mg total) by mouth daily.  . tamsulosin (FLOMAX) 0.4 MG CAPS capsule TAKE 1 CAPSULE BY MOUTH EVERY DAY  . UNABLE TO FIND Take 2 tablets by mouth daily. Omega OX  . tiZANidine (ZANAFLEX) 4 MG tablet TAKE 1 TABLET (4 MG TOTAL) BY MOUTH EVERY 6 (SIX) HOURS AS NEEDED FOR MUSCLE SPASMS. (Patient not taking: Reported on 05/14/2020)   No facility-administered encounter medications on file as of 05/14/2020.    Allergies (verified) Patient has no known allergies.   History: Past Medical History:  Diagnosis Date  . Anginal pain (HCC)   . Arthritis   . COPD (chronic obstructive pulmonary disease) (HCC)   . Erectile disorder due to medical condition in male   . Gout   . Hyperlipidemia   . Hypertension   . Renal  cyst, right 09/14/2019  . Shortness of breath dyspnea    Past Surgical History:  Procedure Laterality Date  . arm surgery Left   . CARDIAC CATHETERIZATION    . TONSILLECTOMY     Family History  Problem Relation Age of Onset  . Renal Disease Mother   . Diabetes Mother   . Dementia Father   . COPD Father   . Emphysema Father   . Cervical cancer Sister   . Diabetes Sister   . Bladder Cancer Brother     . Hypertension Son   . Heart disease Son   . Heart disease Maternal Grandmother   . Heart disease Maternal Grandfather   . Heart disease Paternal Grandmother   . Heart disease Paternal Grandfather   . Aneurysm Sister   . Hypertension Son    Social History   Socioeconomic History  . Marital status: Married    Spouse name: Not on file  . Number of children: Not on file  . Years of education: Not on file  . Highest education level: 9th grade  Occupational History  . Occupation: retired  Tobacco Use  . Smoking status: Former Smoker    Packs/day: 0.25    Years: 62.00    Pack years: 15.50    Types: Cigarettes    Quit date: 01/07/2018    Years since quitting: 2.3  . Smokeless tobacco: Never Used  Vaping Use  . Vaping Use: Never used  Substance and Sexual Activity  . Alcohol use: Yes    Alcohol/week: 5.0 standard drinks    Types: 3 Cans of beer, 2 Shots of liquor per week    Comment: 2 shots of liquor  a week, one beer occasionally   . Drug use: No  . Sexual activity: Not Currently  Other Topics Concern  . Not on file  Social History Narrative  . Not on file   Social Determinants of Health   Financial Resource Strain: Low Risk   . Difficulty of Paying Living Expenses: Not hard at all  Food Insecurity: No Food Insecurity  . Worried About Programme researcher, broadcasting/film/videounning Out of Food in the Last Year: Never true  . Ran Out of Food in the Last Year: Never true  Transportation Needs: No Transportation Needs  . Lack of Transportation (Medical): No  . Lack of Transportation (Non-Medical): No  Physical Activity: Inactive  . Days of Exercise per Week: 0 days  . Minutes of Exercise per Session: 0 min  Stress: No Stress Concern Present  . Feeling of Stress : Not at all  Social Connections:   . Frequency of Communication with Friends and Family:   . Frequency of Social Gatherings with Friends and Family:   . Attends Religious Services:   . Active Member of Clubs or Organizations:   . Attends Tax inspectorClub or  Organization Meetings:   Marland Kitchen. Marital Status:     Tobacco Counseling Counseling given: Not Answered   Clinical Intake:  Pre-visit preparation completed: Yes  Pain : 0-10 Pain Score: 8  Pain Type: Acute pain Pain Location: Ankle Pain Orientation: Right Pain Descriptors / Indicators: Aching Pain Onset: Yesterday Pain Frequency: Constant     Nutritional Status: BMI 25 -29 Overweight Nutritional Risks: None Diabetes: No  How often do you need to have someone help you when you read instructions, pamphlets, or other written materials from your doctor or pharmacy?: 1 - Never What is the last grade level you completed in school?: 9th grade  Diabetic? no  Interpreter Needed?:  No  Information entered by :: NAllen LPN   Activities of Daily Living In your present state of health, do you have any difficulty performing the following activities: 05/14/2020  Hearing? N  Vision? N  Difficulty concentrating or making decisions? N  Walking or climbing stairs? N  Dressing or bathing? N  Doing errands, shopping? N  Preparing Food and eating ? N  Using the Toilet? N  In the past six months, have you accidently leaked urine? N  Do you have problems with loss of bowel control? N  Managing your Medications? N  Managing your Finances? N  Housekeeping or managing your Housekeeping? N  Some recent data might be hidden    Patient Care Team: Marjie Skiff, NP as PCP - General (Nurse Practitioner) Lourena Simmonds, [facility] as Pharmacist (Pharmacist)  Indicate any recent Medical Services you may have received from other than [facility] providers in the past year (date may be approximate).     Assessment:   This is a routine wellness examination for Iren.  Hearing/Vision screen  Hearing Screening   125Hz  250Hz  500Hz  1000Hz  2000Hz  3000Hz  4000Hz  6000Hz  8000Hz   Right ear:           Left ear:           Vision Screening Comments: Regular eye exams, No eye Dr  Dietary issues and exercise  activities discussed: Current Exercise Habits: The patient does not participate in regular exercise at present  Goals    . DIET - INCREASE WATER INTAKE     Recommend drinking at least 6-8 glasses of water a day     . Patient Stated     05/14/2020, no goals    . Quit smoking / using tobacco    . Quit smoking / using tobacco     Smoking cessation discussed      Depression Screen PHQ 2/9 Scores 05/14/2020 05/14/2019 05/08/2018 02/20/2018 05/04/2017 04/17/2017  PHQ - 2 Score 0 0 0 0 0 0  PHQ- 9 Score 0 - - - - -    Fall Risk Fall Risk  05/14/2020 11/21/2019 10/24/2019 05/14/2019 05/08/2018  Falls in the past year? 0 0 - 0 No  Number falls in past yr: - 0 0 - -  Injury with Fall? - 0 0 - -  Risk for fall due to : Medication side effect - - - -  Follow up Falls evaluation completed;Education provided;Falls prevention discussed Falls evaluation completed - - -    Any stairs in or around the home? Yes  If so, are there any without handrails? Yes  Home free of loose throw rugs in walkways, pet beds, electrical cords, etc? Yes  Adequate lighting in your home to reduce risk of falls? Yes   ASSISTIVE DEVICES UTILIZED TO PREVENT FALLS:  Life alert? No  Use of a cane, walker or w/c? No  Grab bars in the bathroom? No  Shower chair or bench in shower? No  Elevated toilet seat or a handicapped toilet? No   TIMED UP AND GO:  Was the test performed? No .    Cognitive Function:     6CIT Screen 05/14/2020 05/08/2018 05/04/2017  What Year? 0 points 0 points 0 points  What month? 0 points 0 points 0 points  What time? 0 points 0 points 0 points  Count back from 20 0 points 0 points 0 points  Months in reverse 0 points 0 points 0 points  Repeat phrase 2 points 0 points 0  points  Total Score 2 0 0    Immunizations Immunization History  Administered Date(s) Administered  . Fluad Quad(high Dose 65+) 09/02/2019  . Influenza, High Dose Seasonal PF 08/21/2017, 06/28/2018  . PFIZER SARS-COV-2  Vaccination 12/03/2019, 12/24/2019  . Pneumococcal Conjugate-13 10/19/2013  . Pneumococcal Polysaccharide-23 10/19/2014  . Td 12/15/2009  . Zoster 10/19/2014    TDAP status: Due, Education has been provided regarding the importance of this vaccine. Advised may receive this vaccine at local pharmacy or Health Dept. Aware to provide a copy of the vaccination record if obtained from local pharmacy or Health Dept. Verbalized acceptance and understanding. Flu Vaccine status: Up to date Pneumococcal vaccine status: Up to date Covid-19 vaccine status: Completed vaccines  Qualifies for Shingles Vaccine? Yes   Zostavax completed Yes   Shingrix Completed?: No.    Education has been provided regarding the importance of this vaccine. Patient has been advised to call insurance company to determine out of pocket expense if they have not yet received this vaccine. Advised may also receive vaccine at local pharmacy or Health Dept. Verbalized acceptance and understanding.  Screening Tests Health Maintenance  Topic Date Due  . TETANUS/TDAP  12/16/2019  . INFLUENZA VACCINE  05/09/2020  . Fecal DNA (Cologuard)  05/12/2021  . COVID-19 Vaccine  Completed  . Hepatitis C Screening  Completed  . PNA vac Low Risk Adult  Completed    Health Maintenance  Health Maintenance Due  Topic Date Due  . TETANUS/TDAP  12/16/2019  . INFLUENZA VACCINE  05/09/2020    Colorectal cancer screening: Completed 05/12/2018. Repeat every 3 years  Lung Cancer Screening: (Low Dose CT Chest recommended if Age 43-80 years, 30 pack-year currently smoking OR have quit w/in 15years.) does not qualify.   Lung Cancer Screening Referral: no  Additional Screening:  Hepatitis C Screening: does qualify; Completed 04/17/2017  Vision Screening: Recommended annual ophthalmology exams for early detection of glaucoma and other disorders of the eye. Is the patient up to date with their annual eye exam?  Yes  Who is the provider or what is  the name of the office in which the patient attends annual eye exams? Looking for new doctor If pt is not established with a provider, would they like to be referred to a provider to establish care? No .   Dental Screening: Recommended annual dental exams for proper oral hygiene  Community Resource Referral / Chronic Care Management: CRR required this visit?  No   CCM required this visit?  No      Plan:     I have personally reviewed and noted the following in the patient's chart:   . Medical and social history . Use of alcohol, tobacco or illicit drugs  . Current medications and supplements . Functional ability and status . Nutritional status . Physical activity . Advanced directives . List of other physicians . Hospitalizations, surgeries, and ER visits in previous 12 months . Vitals . Screenings to include cognitive, depression, and falls . Referrals and appointments  In addition, I have reviewed and discussed with patient certain preventive protocols, quality metrics, and best practice recommendations. A written personalized care plan for preventive services as well as general preventive health recommendations were provided to patient.   Due to this being a telephonic visit, the after visit summary with patients personalized plan was offered to patient via mail or my-chart. Patient preferred to pick up at office at next visit.   Barb Merino, LPN   06/11/7341  Nurse Notes: Due for TDAP.

## 2020-06-21 ENCOUNTER — Other Ambulatory Visit: Payer: Self-pay

## 2020-06-21 ENCOUNTER — Encounter: Payer: Self-pay | Admitting: Nurse Practitioner

## 2020-06-21 ENCOUNTER — Ambulatory Visit (INDEPENDENT_AMBULATORY_CARE_PROVIDER_SITE_OTHER): Payer: Medicare Other | Admitting: Nurse Practitioner

## 2020-06-21 VITALS — BP 132/82 | HR 59 | Temp 98.2°F | Resp 16 | Ht 69.0 in | Wt 204.8 lb

## 2020-06-21 DIAGNOSIS — N281 Cyst of kidney, acquired: Secondary | ICD-10-CM

## 2020-06-21 DIAGNOSIS — I10 Essential (primary) hypertension: Secondary | ICD-10-CM | POA: Diagnosis not present

## 2020-06-21 DIAGNOSIS — R7989 Other specified abnormal findings of blood chemistry: Secondary | ICD-10-CM

## 2020-06-21 DIAGNOSIS — J432 Centrilobular emphysema: Secondary | ICD-10-CM | POA: Diagnosis not present

## 2020-06-21 DIAGNOSIS — E7800 Pure hypercholesterolemia, unspecified: Secondary | ICD-10-CM

## 2020-06-21 DIAGNOSIS — I700 Atherosclerosis of aorta: Secondary | ICD-10-CM

## 2020-06-21 DIAGNOSIS — R0789 Other chest pain: Secondary | ICD-10-CM

## 2020-06-21 DIAGNOSIS — Z23 Encounter for immunization: Secondary | ICD-10-CM

## 2020-06-21 DIAGNOSIS — M1A9XX0 Chronic gout, unspecified, without tophus (tophi): Secondary | ICD-10-CM

## 2020-06-21 DIAGNOSIS — Z87891 Personal history of nicotine dependence: Secondary | ICD-10-CM

## 2020-06-21 DIAGNOSIS — N400 Enlarged prostate without lower urinary tract symptoms: Secondary | ICD-10-CM | POA: Insufficient documentation

## 2020-06-21 DIAGNOSIS — Z125 Encounter for screening for malignant neoplasm of prostate: Secondary | ICD-10-CM

## 2020-06-21 MED ORDER — TAMSULOSIN HCL 0.4 MG PO CAPS: 0.4000 mg | ORAL_CAPSULE | Freq: Every day | ORAL | 4 refills | Status: DC

## 2020-06-21 MED ORDER — AMLODIPINE BESYLATE 5 MG PO TABS: 5.0000 mg | ORAL_TABLET | Freq: Every day | ORAL | 3 refills | Status: DC

## 2020-06-21 MED ORDER — ALLOPURINOL 100 MG PO TABS: 100.0000 mg | ORAL_TABLET | Freq: Every day | ORAL | 4 refills | Status: DC

## 2020-06-21 MED ORDER — LOSARTAN POTASSIUM 100 MG PO TABS: 100.0000 mg | ORAL_TABLET | Freq: Every day | ORAL | 4 refills | Status: DC

## 2020-06-21 NOTE — Assessment & Plan Note (Signed)
Chronic, ongoing.  Continue current medication regimen and adjust as needed.  Lipid panel today, fasting. 

## 2020-06-21 NOTE — Assessment & Plan Note (Signed)
Chronic, stable with addition of Allopurinol 100 MG daily, can adjust doses as needed.  Recheck uric acid level today and treat flares as needed.  Recommend monitoring diet and continue to cut back on alcohol use. ?

## 2020-06-21 NOTE — Progress Notes (Signed)
BP 132/82 (BP Location: Left Arm)   Pulse (!) 59   Temp 98.2 F (36.8 C) (Oral)   Resp 16   Ht 5\' 9"  (1.753 m)   Wt 204 lb 12.8 oz (92.9 kg)   SpO2 98%   BMI 30.24 kg/m    Subjective:    Patient ID: William Murray, male    DOB: 1950-12-15, 69 y.o.   MRN: [id number]  HPI: William Murray is a 69 y.o. male  Chief Complaint  Patient presents with  . Hypertension  . Hyperlipidemia   HYPERTENSION Continues on Losartan 100 MG and Atorvastatin 10 MG daily.  Recent K+ in March was 4.1.  Was started on Allopurinol in February was report of pain and elevated uric acid level, since this time level trended down to 5 in March and pain improved.  In past reports taking one BP medication that caused cough.  He sees Dr. April in October, reports he is having intermittent episodes at night when feels his heart is "swollen", does not hurt a lot. These episodes last 2-3 minutes.  Reports he does note palpitations, but this is normal for him and has been this way all his life.   Denies diaphoresis, SOB, N&V, back pain, shoulder pain, or reflux with these episodes.   Hypertension status: stable  Satisfied with current treatment? yes Duration of hypertension: chronic BP monitoring frequency:  rarely BP range: 130-140/70 range, occasional 150 SBP BP medication side effects:  no Medication compliance: good compliance Previous BP meds: Losartan and HCTZ Aspirin: yes Recurrent headaches: no Visual changes: no Palpitations: no Dyspnea: no Chest pain: no Lower extremity edema: occasional Dizzy/lightheaded: no The ASCVD Risk score November DC Jr., et al., 2013) failed to calculate for the following reasons:   The valid total cholesterol range is 130 to 320 mg/dL   COPD Recent lung screening was done July 2021 -- noting mild centrilobular emphysema and aortic atherosclerosis.  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  Currently only use  Albuterol as needed, has not had to use this year.  Has history of smoking, quit several years ago. COPD status: controlled Satisfied with current treatment?: yes Oxygen use: no Dyspnea frequency:  none Cough frequency: none Rescue inhaler frequency:  none Limitation of activity: no Productive cough:  none Last Spirometry: August 2018 Pneumovax: Up to Date Influenza: Up to Date   Relevant past medical, surgical, family and social history reviewed and updated as indicated. Interim medical history since our last visit reviewed. Allergies and medications reviewed and updated.  Review of Systems  Constitutional: Negative for activity change, diaphoresis, fatigue and fever.  Respiratory: Positive for chest tightness. Negative for cough, shortness of breath and wheezing.   Cardiovascular: Negative for chest pain, palpitations and leg swelling.  Gastrointestinal: Negative.   Musculoskeletal: Negative for arthralgias and joint swelling.  Neurological: Negative.   Psychiatric/Behavioral: Negative.     Per HPI unless specifically indicated above     Objective:    BP 132/82 (BP Location: Left Arm)   Pulse (!) 59   Temp 98.2 F (36.8 C) (Oral)   Resp 16   Ht 5\' 9"  (1.753 m)   Wt 204 lb 12.8 oz (92.9 kg)   SpO2 98%   BMI 30.24 kg/m   Wt Readings from Last 3 Encounters:  06/21/20 204 lb 12.8 oz (92.9 kg)  05/14/20 202 lb (91.6 kg)  04/30/20 (!) 203 lb (  92.1 kg)    Physical Exam Vitals and nursing note reviewed.  Constitutional:      General: He is awake. He is not in acute distress.    Appearance: He is well-developed. He is obese. He is not ill-appearing.  HENT:     Head: Normocephalic and atraumatic.     Right Ear: Hearing normal. No drainage.     Left Ear: Hearing normal. No drainage.  Eyes:     General: Lids are normal.        Right eye: No discharge.        Left eye: No discharge.     Conjunctiva/sclera: Conjunctivae normal.     Pupils: Pupils are equal, round, and  reactive to light.  Neck:     Vascular: No carotid bruit.  Cardiovascular:     Rate and Rhythm: Normal rate and regular rhythm.     Heart sounds: Normal heart sounds, S1 normal and S2 normal. No murmur heard.  No gallop.   Pulmonary:     Effort: Pulmonary effort is normal. No accessory muscle usage or respiratory distress.     Breath sounds: Normal breath sounds.  Abdominal:     General: Bowel sounds are normal.     Palpations: Abdomen is soft.  Musculoskeletal:        General: Normal range of motion.     Right hand: Normal.     Left hand: Normal.     Cervical back: Normal range of motion and neck supple.     Right lower leg: Edema (trace at sock line) present.     Left lower leg: Edema (trace at sock line) present.  Skin:    General: Skin is warm and dry.  Neurological:     Mental Status: He is alert and oriented to person, place, and time.  Psychiatric:        Mood and Affect: Mood normal.        Behavior: Behavior normal. Behavior is cooperative.        Thought Content: Thought content normal.        Judgment: Judgment normal.    EKG in office with rate 66, normal axis, frequent PVC noted.  Results for orders placed or performed in visit on 02/13/20  Microscopic Examination   URINE  Result Value Ref Range   WBC, UA None seen 0 - 5 /hpf   RBC 0-2 0 - 2 /hpf   Epithelial Cells (non renal) 0-10 0 - 10 /hpf   Bacteria, UA None seen None seen/Few  UA/M w/rflx Culture, Routine   Specimen: Urine   URINE  Result Value Ref Range   Specific Gravity, UA 1.015 1.005 - 1.030   pH, UA 5.0 5.0 - 7.5   Color, UA Yellow Yellow   Appearance Ur Clear Clear   Leukocytes,UA Negative Negative   Protein,UA Negative Negative/Trace   Glucose, UA Negative Negative   Ketones, UA Negative Negative   RBC, UA Trace (A) Negative   Bilirubin, UA Negative Negative   Urobilinogen, Ur 0.2 0.2 - 1.0 mg/dL   Nitrite, UA Negative Negative   Microscopic Examination See below:       Assessment  & Plan:   Problem List Items Addressed This Visit      Cardiovascular and Mediastinum   Essential hypertension, benign    Chronic, ongoing with initial BP in office today elevated, but repeat at goal and home BP occasional elevations. Continue Losartan 100 MG daily and add low dose of Amlodipine  5 MG, script sent, try to avoid HCTZ due to gout history + avoid ACE due to cough history.  Continue to monitor BP daily at home.  DASH diet at home.  Continue to collaborate with cardiology, upcoming visit.  Return in 6 months.  BMP, TSH today. To call provider if any increased edema noted with Amlodipine.      Relevant Medications   amLODipine (NORVASC) 5 MG tablet   losartan (COZAAR) 100 MG tablet   Other Relevant Orders   Basic metabolic panel   TSH   EKG 12-Lead (Completed)   Aortic atherosclerosis (HCC)    Continue Lipitor and ASA daily for prevention.  Continue complete cessation of smoking.  CT scan yearly until age 87.      Relevant Medications   amLODipine (NORVASC) 5 MG tablet   losartan (COZAAR) 100 MG tablet     Respiratory   Centrilobular emphysema (HCC) - Primary    Chronic, stable with minimal Albuterol use.  Continue current regimen and adjust as needed.  Will perform spirometry at next visit and yearly to monitor.  Continue annual CT lung screens until age 72.  Recommend continue cessation of smoking.        Genitourinary   Renal cyst, right    Noted on CT lung screening and remains stable in size.  Monitor closely and refer to nephrology as needed.        Other   Hypercholesteremia    Chronic, ongoing.  Continue current medication regimen and adjust as needed.  Lipid panel today, fasting.      Relevant Medications   amLODipine (NORVASC) 5 MG tablet   losartan (COZAAR) 100 MG tablet   Other Relevant Orders   Lipid Panel w/o Chol/HDL Ratio   Chronic gout    Chronic, stable with addition of Allopurinol 100 MG daily, can adjust doses as needed.  Recheck uric acid  level today and treat flares as needed.  Recommend monitoring diet and continue to cut back on alcohol use.      Relevant Orders   Uric acid   History of smoking    Continue cessation of smoking and yearly CT scan until age 39.      Elevated TSH    Check TSH today and initiate medication as needed.      Chest pressure    EKG noting frequent PVC, rate 66.  Has upcoming visit with cardiology.  Will continue to monitor and educated him on red flag symptoms to immediately attend ER for.  Would benefit from Holter monitor.  Will await cardiology recommendations and plan, review note when available.  Check labs today.       Other Visit Diagnoses    Prostate cancer screening       Will check PSA today on labs, has not had checked since 2019.   Relevant Orders   PSA   Flu vaccine need       Flu shot today.   Relevant Orders   Flu Vaccine QUAD High Dose(Fluad)       Follow up plan: Return in about 6 months (around 12/19/2020) for HTN/HLD, BPH, COPD -- needs spirometry.

## 2020-06-21 NOTE — Assessment & Plan Note (Signed)
Check TSH today and initiate medication as needed.

## 2020-06-21 NOTE — Patient Instructions (Signed)
DASH Eating Plan DASH stands for "Dietary Approaches to Stop Hypertension." The DASH eating plan is a healthy eating plan that has been shown to reduce high blood pressure (hypertension). It may also reduce your risk for type 2 diabetes, heart disease, and stroke. The DASH eating plan may also help with weight loss. What are tips for following this plan?  General guidelines  Avoid eating more than 2,300 mg (milligrams) of salt (sodium) a day. If you have hypertension, you may need to reduce your sodium intake to 1,500 mg a day.  Limit alcohol intake to no more than 1 drink a day for nonpregnant women and 2 drinks a day for men. One drink equals 12 oz of beer, 5 oz of wine, or 1 oz of hard liquor.  Work with your health care provider to maintain a healthy body weight or to lose weight. Ask what an ideal weight is for you.  Get at least 30 minutes of exercise that causes your heart to beat faster (aerobic exercise) most days of the week. Activities may include walking, swimming, or biking.  Work with your health care provider or diet and nutrition specialist (dietitian) to adjust your eating plan to your individual calorie needs. Reading food labels   Check food labels for the amount of sodium per serving. Choose foods with less than 5 percent of the Daily Value of sodium. Generally, foods with less than 300 mg of sodium per serving fit into this eating plan.  To find whole grains, look for the word "whole" as the first word in the ingredient list. Shopping  Buy products labeled as "low-sodium" or "no salt added."  Buy fresh foods. Avoid canned foods and premade or frozen meals. Cooking  Avoid adding salt when cooking. Use salt-free seasonings or herbs instead of table salt or sea salt. Check with your health care provider or pharmacist before using salt substitutes.  Do not fry foods. Cook foods using healthy methods such as baking, boiling, grilling, and broiling instead.  Cook with  heart-healthy oils, such as olive, canola, soybean, or sunflower oil. Meal planning  Eat a balanced diet that includes: ? 5 or more servings of fruits and vegetables each day. At each meal, try to fill half of your plate with fruits and vegetables. ? Up to 6-8 servings of whole grains each day. ? Less than 6 oz of lean meat, poultry, or fish each day. A 3-oz serving of meat is about the same size as a deck of cards. One egg equals 1 oz. ? 2 servings of low-fat dairy each day. ? A serving of nuts, seeds, or beans 5 times each week. ? Heart-healthy fats. Healthy fats called Omega-3 fatty acids are found in foods such as flaxseeds and coldwater fish, like sardines, salmon, and mackerel.  Limit how much you eat of the following: ? Canned or prepackaged foods. ? Food that is high in trans fat, such as fried foods. ? Food that is high in saturated fat, such as fatty meat. ? Sweets, desserts, sugary drinks, and other foods with added sugar. ? Full-fat dairy products.  Do not salt foods before eating.  Try to eat at least 2 vegetarian meals each week.  Eat more home-cooked food and less restaurant, buffet, and fast food.  When eating at a restaurant, ask that your food be prepared with less salt or no salt, if possible. What foods are recommended? The items listed may not be a complete list. Talk with your dietitian about   what dietary choices are best for you. Grains Whole-grain or whole-wheat bread. Whole-grain or whole-wheat pasta. Brown rice. Oatmeal. Quinoa. Bulgur. Whole-grain and low-sodium cereals. Pita bread. Low-fat, low-sodium crackers. Whole-wheat flour tortillas. Vegetables Fresh or frozen vegetables (raw, steamed, roasted, or grilled). Low-sodium or reduced-sodium tomato and vegetable juice. Low-sodium or reduced-sodium tomato sauce and tomato paste. Low-sodium or reduced-sodium canned vegetables. Fruits All fresh, dried, or frozen fruit. Canned fruit in natural juice (without  added sugar). Meat and other protein foods Skinless chicken or turkey. Ground chicken or turkey. Pork with fat trimmed off. Fish and seafood. Egg whites. Dried beans, peas, or lentils. Unsalted nuts, nut butters, and seeds. Unsalted canned beans. Lean cuts of beef with fat trimmed off. Low-sodium, lean deli meat. Dairy Low-fat (1%) or fat-free (skim) milk. Fat-free, low-fat, or reduced-fat cheeses. Nonfat, low-sodium ricotta or cottage cheese. Low-fat or nonfat yogurt. Low-fat, low-sodium cheese. Fats and oils Soft margarine without trans fats. Vegetable oil. Low-fat, reduced-fat, or light mayonnaise and salad dressings (reduced-sodium). Canola, safflower, olive, soybean, and sunflower oils. Avocado. Seasoning and other foods Herbs. Spices. Seasoning mixes without salt. Unsalted popcorn and pretzels. Fat-free sweets. What foods are not recommended? The items listed may not be a complete list. Talk with your dietitian about what dietary choices are best for you. Grains Baked goods made with fat, such as croissants, muffins, or some breads. Dry pasta or rice meal packs. Vegetables Creamed or fried vegetables. Vegetables in a cheese sauce. Regular canned vegetables (not low-sodium or reduced-sodium). Regular canned tomato sauce and paste (not low-sodium or reduced-sodium). Regular tomato and vegetable juice (not low-sodium or reduced-sodium). Pickles. Olives. Fruits Canned fruit in a light or heavy syrup. Fried fruit. Fruit in cream or butter sauce. Meat and other protein foods Fatty cuts of meat. Ribs. Fried meat. Bacon. Sausage. Bologna and other processed lunch meats. Salami. Fatback. Hotdogs. Bratwurst. Salted nuts and seeds. Canned beans with added salt. Canned or smoked fish. Whole eggs or egg yolks. Chicken or turkey with skin. Dairy Whole or 2% milk, cream, and half-and-half. Whole or full-fat cream cheese. Whole-fat or sweetened yogurt. Full-fat cheese. Nondairy creamers. Whipped toppings.  Processed cheese and cheese spreads. Fats and oils Butter. Stick margarine. Lard. Shortening. Ghee. Bacon fat. Tropical oils, such as coconut, palm kernel, or palm oil. Seasoning and other foods Salted popcorn and pretzels. Onion salt, garlic salt, seasoned salt, table salt, and sea salt. Worcestershire sauce. Tartar sauce. Barbecue sauce. Teriyaki sauce. Soy sauce, including reduced-sodium. Steak sauce. Canned and packaged gravies. Fish sauce. Oyster sauce. Cocktail sauce. Horseradish that you find on the shelf. Ketchup. Mustard. Meat flavorings and tenderizers. Bouillon cubes. Hot sauce and Tabasco sauce. Premade or packaged marinades. Premade or packaged taco seasonings. Relishes. Regular salad dressings. Where to find more information:  National Heart, Lung, and Blood Institute: www.nhlbi.nih.gov  American Heart Association: www.heart.org Summary  The DASH eating plan is a healthy eating plan that has been shown to reduce high blood pressure (hypertension). It may also reduce your risk for type 2 diabetes, heart disease, and stroke.  With the DASH eating plan, you should limit salt (sodium) intake to 2,300 mg a day. If you have hypertension, you may need to reduce your sodium intake to 1,500 mg a day.  When on the DASH eating plan, aim to eat more fresh fruits and vegetables, whole grains, lean proteins, low-fat dairy, and heart-healthy fats.  Work with your health care provider or diet and nutrition specialist (dietitian) to adjust your eating plan to your   individual calorie needs. This information is not intended to replace advice given to you by your health care provider. Make sure you discuss any questions you have with your health care provider. Document Revised: 09/07/2017 Document Reviewed: 09/18/2016 Elsevier Patient Education  2020 [employer] Inc.  

## 2020-06-21 NOTE — Assessment & Plan Note (Signed)
Noted on CT lung screening and remains stable in size.  Monitor closely and refer to nephrology as needed. 

## 2020-06-21 NOTE — Assessment & Plan Note (Addendum)
Chronic, ongoing with initial BP in office today elevated, but repeat at goal and home BP occasional elevations. Continue Losartan 100 MG daily and add low dose of Amlodipine 5 MG, script sent, try to avoid HCTZ due to gout history + avoid ACE due to cough history.  Continue to monitor BP daily at home.  DASH diet at home.  Continue to collaborate with cardiology, upcoming visit.  Return in 6 months.  BMP, TSH today. To call provider if any increased edema noted with Amlodipine.

## 2020-06-21 NOTE — Assessment & Plan Note (Signed)
Chronic, stable with minimal Albuterol use.  Continue current regimen and adjust as needed.  Will perform spirometry at next visit and yearly to monitor.  Continue annual CT lung screens until age 69.  Recommend continue cessation of smoking.

## 2020-06-21 NOTE — Assessment & Plan Note (Signed)
Continue cessation of smoking and yearly CT scan until age 69. 

## 2020-06-21 NOTE — Assessment & Plan Note (Signed)
EKG noting frequent PVC, rate 66.  Has upcoming visit with cardiology.  Will continue to monitor and educated him on red flag symptoms to immediately attend ER for.  Would benefit from Holter monitor.  Will await cardiology recommendations and plan, review note when available.  Check labs today.

## 2020-06-21 NOTE — Assessment & Plan Note (Signed)
Continue Lipitor and ASA daily for prevention.  Continue complete cessation of smoking.  CT scan yearly until age 69. 

## 2020-06-22 LAB — BASIC METABOLIC PANEL
BUN/Creatinine Ratio: 11 (ref 10–24)
BUN: 11 mg/dL (ref 8–27)
CO2: 21 mmol/L (ref 20–29)
Calcium: 9.6 mg/dL (ref 8.6–10.2)
Chloride: 104 mmol/L (ref 96–106)
Creatinine, Ser: 1 mg/dL (ref 0.76–1.27)
GFR calc Af Amer: 88 mL/min/{1.73_m2} (ref >59)
GFR calc non Af Amer: 76 mL/min/{1.73_m2} (ref >59)
Glucose: 100 mg/dL — ABNORMAL HIGH (ref 65–99)
Potassium: 4 mmol/L (ref 3.5–5.2)
Sodium: 140 mmol/L (ref 134–144)

## 2020-06-22 LAB — TSH: TSH: 4.49 u[IU]/mL (ref 0.450–4.500)

## 2020-06-22 LAB — LIPID PANEL W/O CHOL/HDL RATIO
Cholesterol, Total: 121 mg/dL (ref 100–199)
HDL: 37 mg/dL — ABNORMAL LOW (ref >39)
LDL Chol Calc (NIH): 49 mg/dL (ref 0–99)
Triglycerides: 221 mg/dL — ABNORMAL HIGH (ref 0–149)
VLDL Cholesterol Cal: 35 mg/dL (ref 5–40)

## 2020-06-22 LAB — PSA: Prostate Specific Ag, Serum: 1 ng/mL (ref 0.0–4.0)

## 2020-06-22 LAB — URIC ACID: Uric Acid: 5.2 mg/dL (ref 3.8–8.4)

## 2020-06-22 NOTE — Progress Notes (Signed)
Good morning, please let William Murray know his labs have returned and overall remain stable and at baseline.  No medication changes needed.  He is doing great!!  Continue all current medications and will see him next visit.  Have a great day!!

## 2020-07-20 ENCOUNTER — Ambulatory Visit (INDEPENDENT_AMBULATORY_CARE_PROVIDER_SITE_OTHER): Payer: Medicare Other | Admitting: Cardiovascular Disease

## 2020-07-20 ENCOUNTER — Encounter: Payer: Self-pay | Admitting: Cardiovascular Disease

## 2020-07-20 ENCOUNTER — Other Ambulatory Visit: Payer: Self-pay

## 2020-07-20 ENCOUNTER — Ambulatory Visit (INDEPENDENT_AMBULATORY_CARE_PROVIDER_SITE_OTHER): Payer: Medicare Other

## 2020-07-20 VITALS — BP 140/80 | HR 80 | Ht 69.0 in | Wt 201.0 lb

## 2020-07-20 DIAGNOSIS — I700 Atherosclerosis of aorta: Secondary | ICD-10-CM | POA: Diagnosis not present

## 2020-07-20 DIAGNOSIS — R002 Palpitations: Secondary | ICD-10-CM | POA: Diagnosis not present

## 2020-07-20 DIAGNOSIS — I10 Essential (primary) hypertension: Secondary | ICD-10-CM | POA: Diagnosis not present

## 2020-07-20 DIAGNOSIS — E7800 Pure hypercholesterolemia, unspecified: Secondary | ICD-10-CM

## 2020-07-20 NOTE — Progress Notes (Signed)
Cardiology Office Note  Date:  07/20/2020   ID:  DEVAUN HERNANDEZ, DOB Mar 13, 1951, MRN [id number]  PCP:  Marjie Skiff, NP   Chief Complaint  Patient presents with  . office visit    F/U appointment-Patient reports palpitations; Meds verbally reviewed with patient.    HPI:  [name] is a 70 year old gentleman with past medical history of Hypertension Ectopy, for years Quit smoking, occasional Chronic shortness of breath, COPD Prior stress test July 2016 Who presents for follow-up of his abnormal EKG, chest pain  Last seen 2019 Recent CT scan July 2021 labeled as having coronary calcifications, aortic atherosclerosis  Past 3-4 months After been laying in bed,  Gets Some fullness on the left, feels like it gets full then it lets go More at night, comes and goes, couple minutes at the most Rare sx in the day, not as often as night Not associated with exertion, no chest pain or shortness of breath with activity sometimes when driving and he has 1 of these spells, gets lightheaded for second  EKG reviewed dated 9/13.2021 with nsr, PVC in trigeminal pattern  Prior notes indicates a long history of " thumping" in the left chest, occasional fullness, palpitations, rare tight feeling lasting several seconds at a time at rest  Is typically always been active, mowing, cleaning house etc. Able to walk long distances without symptoms  He does report having prior shingles in the left chest in the past Previously wife reported that he was very sedentary, sit around the house  Stress test 2016, no ischemia, for chest pain  Prior cardiac cath, done at [facility], results not available LE arterial Doppler 03/21/2012: normal  CT scan chest July 2015 reviewed with him showing no significant coronary calcifications, no aortic atherosclerosis, no carotid plaque   PMH:   has a past medical history of Anginal pain (HCC), Arthritis, COPD (chronic obstructive pulmonary disease) (HCC), Erectile  disorder due to medical condition in male, Gout, Hyperlipidemia, Hypertension, Renal cyst, 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  PSH:    Past Surgical History:  Procedure Laterality Date  . arm surgery Left   . CARDIAC CATHETERIZATION    . TONSILLECTOMY      Current Outpatient Medications  Medication Sig Dispense Refill  . acetaminophen (TYLENOL) 500 MG tablet Take 500 mg by mouth every 6 (six) hours as needed.    Marland Kitchen albuterol (PROVENTIL HFA;VENTOLIN HFA) 108 (90 Base) MCG/ACT inhaler Inhale 2 puffs into the lungs every 6 (six) hours as needed for wheezing or shortness of breath.    . allopurinol (ZYLOPRIM) 100 MG tablet Take 1 tablet (100 mg total) by mouth daily. 90 tablet 4  . amLODipine (NORVASC) 5 MG tablet Take 1 tablet (5 mg total) by mouth daily. 90 tablet 3  . aspirin 81 MG tablet Take 1 tablet (81 mg total) by mouth daily. 30 tablet 12  . atorvastatin (LIPITOR) 10 MG tablet Take 1 tablet (10 mg total) by mouth daily at 6 PM. 90 tablet 4  . losartan (COZAAR) 100 MG tablet Take 1 tablet (100 mg total) by mouth daily. 90 tablet 4  . magnesium oxide (MAG-OX) 400 MG tablet Take 800 mg by mouth every evening.    . tamsulosin (FLOMAX) 0.4 MG CAPS capsule Take 1 capsule (0.4 mg total) by mouth daily. 90 capsule 4  . tiZANidine (ZANAFLEX) 4 MG tablet TAKE 1 TABLET (4 MG TOTAL) BY MOUTH EVERY 6 (SIX) HOURS AS NEEDED FOR MUSCLE SPASMS. 360 tablet  1  . UNABLE TO FIND Take 2 tablets by mouth daily. Omega OX     No current facility-administered medications for this visit.     Allergies:   Patient has no known allergies.   Social History:  The patient  reports that he quit smoking about 2 years ago. His smoking use included cigarettes. He has a 15.50 pack-year smoking history. He has never used smokeless tobacco. He reports current alcohol use of about 5.0 standard drinks of alcohol per week. He reports that he does not use drugs.   Family History:   family history includes  Aneurysm in his sister; Bladder Cancer in his brother; COPD in his father; Cervical cancer in his sister; Dementia in his father; Diabetes in his mother and sister; Emphysema in his father; Heart disease in his maternal grandfather, maternal grandmother, paternal grandfather, paternal grandmother, and son; Hypertension in his son and son; Renal Disease in his mother.    Review of Systems: Review of Systems  Constitutional: Negative.   HENT: Negative.   Respiratory: Negative.   Cardiovascular: Negative.        Left chest fullness, palpitaions  Gastrointestinal: Negative.   Musculoskeletal: Negative.   Neurological: Negative.   Psychiatric/Behavioral: Negative.   All other systems reviewed and are negative.   PHYSICAL EXAM: VS:  BP 140/80 (BP Location: Left Arm, Patient Position: Sitting, Cuff Size: Normal)   Pulse 80   Ht 5\' 9"  (1.753 m)   Wt 201 lb (91.2 kg)   SpO2 97%   BMI 29.68 kg/m  , BMI Body mass index is 29.68 kg/m. Constitutional:  oriented to person, place, and time. No distress.  HENT:  Head: Grossly normal Eyes:  no discharge. No scleral icterus.  Neck: No JVD, no carotid bruits  Cardiovascular: Regular rate and rhythm, no murmurs appreciated Pulmonary/Chest: Clear to auscultation bilaterally, no wheezes or rails Abdominal: Soft.  no distension.  no tenderness.  Musculoskeletal: Normal range of motion Neurological:  normal muscle tone. Coordination normal. No atrophy Skin: Skin warm and dry Psychiatric: normal affect, pleasant   Recent Labs: 09/02/2019: Hemoglobin 17.2; Magnesium 2.1; Platelets 179 11/21/2019: ALT 14 06/21/2020: BUN 11; Creatinine, Ser 1.00; Potassium 4.0; Sodium 140; TSH 4.490    Lipid Panel Lab Results  Component Value Date   CHOL 121 06/21/2020   HDL 37 (L) 06/21/2020   LDLCALC 49 06/21/2020   TRIG 221 (H) 06/21/2020      Wt Readings from Last 3 Encounters:  07/20/20 201 lb (91.2 kg)  06/21/20 204 lb 12.8 oz (92.9 kg)  05/14/20  202 lb (91.6 kg)     ASSESSMENT AND PLAN:  Chest pain, unspecified type Atypical chest pain,Sometimes at rest Prior CT scan no coronary calcifications Not having any symptoms with exertion concerning for angina Long history of similar symptoms zio monitor ordered given PVCs  Essential hypertension, benign Blood pressure is well controlled on today's visit. No changes made to the medications.  Hypercholesteremia Cholesterol is at goal on the current lipid regimen. No changes to the medications were made.  Stage 1 mild COPD by GOLD classification (HCC) Prior history of smoking,  Suspect smoked 20 years total heavy He has stopped smoking, denies shortness of breath on exertion  Tobacco abuse He stopped smoking years ago  Heart palpitations Zio monitor ordered to correlate symptoms to his arrhythmia  Disposition: We will call him with the results of his monitor   Total encounter time more than 25 minutes  Greater than 50% was spent  in counseling and coordination of care with the patient    Orders Placed This Encounter  Procedures  . LONG TERM MONITOR (3-14 DAYS)     Signed, Dossie Arbour, M.D., Ph.D. 07/20/2020  [facility] [place], [place] [phone]

## 2020-07-20 NOTE — Patient Instructions (Addendum)
   Medication Instructions:  No changes  If you need a refill on your cardiac medications before your next appointment, please call your pharmacy.    Lab work: No new labs needed   If you have labs (blood work) drawn today and your tests are completely normal, you will receive your results only by: Marland Kitchen MyChart Message (if you have MyChart) OR . A paper copy in the mail If you have any lab test that is abnormal or we need to change your treatment, we will call you to review the results.   Testing/Procedures: Zio monitor for left chest fullness, palpitations Your physician has recommended that you wear a Zio monitor. This monitor is a medical device that records the heart's electrical activity. Doctors most often use these monitors to diagnose arrhythmias. Arrhythmias are problems with the speed or rhythm of the heartbeat. The monitor is a small device applied to your chest. You can wear one while you do your normal daily activities. While wearing this monitor if you have any symptoms to push the button and record what you felt. Once you have worn this monitor for the period of time provider prescribed (Usually 14 days), you will return the monitor device in the postage paid box. Once it is returned they will download the data collected and provide [place] with a report which the provider will then review and we will call you with those results. Important tips:  1. Avoid showering during the first 24 hours of wearing the monitor. 2. Avoid excessive sweating to help maximize wear time. 3. Do not submerge the device, no hot tubs, and no swimming pools. 4. Keep any lotions or oils away from the patch. 5. After 24 hours you may shower with the patch on. Take brief showers with your back facing the shower head.  6. Do not remove patch once it has been placed because that will interrupt data and decrease adhesive wear time. 7. Push the button when you have any symptoms and write down what you were  feeling. 8. Once you have completed wearing your monitor, remove and place into box which has postage paid and place in your outgoing mailbox.  9. If for some reason you have misplaced your box then call our office and we can provide another box and/or mail it off for you.         Follow-Up: At [facility], you and your health needs are our priority.  As part of our continuing mission to provide you with exceptional heart care, we have created designated Provider Care Teams.  These Care Teams include your primary Cardiologist (physician) and Advanced Practice Providers (APPs -  Physician Assistants and Nurse Practitioners) who all work together to provide you with the care you need, when you need it.  . You will need a follow up appointment in 12 months  . Providers on your designated Care Team:   . Nicolasa Ducking, NP . Eula Listen, PA-C . Marisue Ivan, PA-C  Any Other Special Instructions Will Be Listed Below (If Applicable).  COVID-19 Vaccine Information can be found at: [email or web address] For questions related to vaccine distribution or appointments, please email [email or web address] or call [phone].

## 2020-08-11 DIAGNOSIS — R002 Palpitations: Secondary | ICD-10-CM | POA: Diagnosis not present

## 2020-08-23 ENCOUNTER — Ambulatory Visit (INDEPENDENT_AMBULATORY_CARE_PROVIDER_SITE_OTHER): Payer: Medicare Other | Admitting: Nurse Practitioner

## 2020-08-23 ENCOUNTER — Encounter: Payer: Self-pay | Admitting: Nurse Practitioner

## 2020-08-23 ENCOUNTER — Other Ambulatory Visit: Payer: Self-pay

## 2020-08-23 DIAGNOSIS — R21 Rash and other nonspecific skin eruption: Secondary | ICD-10-CM | POA: Diagnosis not present

## 2020-08-23 MED ORDER — TRIAMCINOLONE ACETONIDE 0.1 % EX CREA
1.0000 | TOPICAL_CREAM | Freq: Two times a day (BID) | CUTANEOUS | 0 refills | Status: DC
Start: 2020-08-23 — End: 2023-12-23

## 2020-08-23 MED ORDER — PREDNISONE 10 MG PO TABS: ORAL_TABLET | ORAL | 0 refills | Status: DC

## 2020-08-23 NOTE — Progress Notes (Signed)
BP (!) 157/63   Pulse 70   Temp (!) 97.5 F (36.4 C)   Wt 205 lb 6.4 oz (93.2 kg)   SpO2 96%   BMI 30.33 kg/m    Subjective:    Patient ID: William Murray, male    DOB: 06-23-1951, 69 y.o.   MRN: [id number]  HPI: William Murray is a 69 y.o. male  Chief Complaint  Patient presents with  . Edema    pt states he has swelling in both legs for a few weeks, pt states it is red and itches all the time. Pt states he occasionally gets a burning feeling in his legs.    RASH & EDEMA BILATERAL LEGS Started 2 weeks ago to both lower legs (knees dow).  Reports he does have some itching and burning with the rash + his son felt there was some swelling.  Has not been outside doing yard work that he recalls.  He denies SOB, CP, palpitations, or orthopnea. Duration:  weeks  Location: legs  Itching: yes Burning: yes Redness: yes Oozing: no Scaling: no Blisters: no Painful: no Fevers: no Change in detergents/soaps/personal care products: no Recent illness: no Recent travel:no History of same: a few years back he had similar -- after walking on concrete -- worse then current rash Context: fluctuating Alleviating factors: nothing Treatments attempted:nothing Shortness of breath: no  Throat/tongue swelling: no Myalgias/arthralgias: no  Relevant past medical, surgical, family and social history reviewed and updated as indicated. Interim medical history since our last visit reviewed. Allergies and medications reviewed and updated.  Review of Systems  Constitutional: Negative for activity change, diaphoresis, fatigue and fever.  Respiratory: Negative for cough, chest tightness, shortness of breath and wheezing.   Cardiovascular: Positive for leg swelling (he does not notice, but reports son did). Negative for chest pain and palpitations.  Gastrointestinal: Negative.   Skin: Positive for rash.  Neurological: Negative.   Psychiatric/Behavioral: Negative.     Per HPI unless  specifically indicated above     Objective:    BP (!) 157/63   Pulse 70   Temp (!) 97.5 F (36.4 C)   Wt 205 lb 6.4 oz (93.2 kg)   SpO2 96%   BMI 30.33 kg/m   Wt Readings from Last 3 Encounters:  08/23/20 205 lb 6.4 oz (93.2 kg)  07/20/20 201 lb (91.2 kg)  06/21/20 204 lb 12.8 oz (92.9 kg)    Physical Exam Vitals and nursing note reviewed.  Constitutional:      General: He is awake. He is not in acute distress.    Appearance: He is well-developed. He is obese. He is not ill-appearing.  HENT:     Head: Normocephalic and atraumatic.     Right Ear: Hearing normal. No drainage.     Left Ear: Hearing normal. No drainage.  Eyes:     General: Lids are normal.        Right eye: No discharge.        Left eye: No discharge.     Conjunctiva/sclera: Conjunctivae normal.     Pupils: Pupils are equal, round, and reactive to light.  Neck:     Vascular: No carotid bruit.  Cardiovascular:     Rate and Rhythm: Normal rate and regular rhythm.     Heart sounds: Normal heart sounds, S1 normal and S2 normal. No murmur heard.  No gallop.   Pulmonary:     Effort: Pulmonary effort is normal. No accessory muscle usage or respiratory  distress.     Breath sounds: Normal breath sounds.  Abdominal:     General: Bowel sounds are normal.     Palpations: Abdomen is soft.  Musculoskeletal:        General: Normal range of motion.     Right hand: Normal.     Left hand: Normal.     Cervical back: Normal range of motion and neck supple.     Right lower leg: No edema.     Left lower leg: No edema.  Skin:    General: Skin is warm and dry.     Findings: Rash present. Rash is macular. Rash is not crusting, scaling or vesicular.     Comments: Macular, flat scattered rash to bilateral lower extremities, from knee to ankle.  R>L.  No vesicles or lesions noted. Mild erythema.  No edema, warmth, or tenderness.  Neurological:     Mental Status: He is alert and oriented to person, place, and time.    Psychiatric:        Attention and Perception: Attention normal.        Mood and Affect: Mood normal.        Speech: Speech normal.        Behavior: Behavior normal. Behavior is cooperative.        Thought Content: Thought content normal.    Results for orders placed or performed in visit on 06/21/20  Basic metabolic panel  Result Value Ref Range   Glucose 100 (H) 65 - 99 mg/dL   BUN 11 8 - 27 mg/dL   Creatinine, Ser 5.03 0.76 - 1.27 mg/dL   GFR calc non Af Amer 76 >59 mL/min/1.73   GFR calc Af Amer 88 >59 mL/min/1.73   BUN/Creatinine Ratio 11 10 - 24   Sodium 140 134 - 144 mmol/L   Potassium 4.0 3.5 - 5.2 mmol/L   Chloride 104 96 - 106 mmol/L   CO2 21 20 - 29 mmol/L   Calcium 9.6 8.6 - 10.2 mg/dL  Lipid Panel w/o Chol/HDL Ratio  Result Value Ref Range   Cholesterol, Total 121 100 - 199 mg/dL   Triglycerides 546 (H) 0 - 149 mg/dL   HDL 37 (L) >56 mg/dL   VLDL Cholesterol Cal 35 5 - 40 mg/dL   LDL Chol Calc (NIH) 49 0 - 99 mg/dL  TSH  Result Value Ref Range   TSH 4.490 0.450 - 4.500 uIU/mL  Uric acid  Result Value Ref Range   Uric Acid 5.2 3.8 - 8.4 mg/dL  PSA  Result Value Ref Range   Prostate Specific Ag, Serum 1.0 0.0 - 4.0 ng/mL      Assessment & Plan:   Problem List Items Addressed This Visit      Musculoskeletal and Integument   Rash    Acute x 3 weeks with no worsening.  No edema noted to BLE.  Rash noted, ?contact dermatitis.  Will send in script for Prednisone taper and Triamcinolone cream.  Educated him on each and use.  Recommend monitoring area if any worsening return to office immediately.  At this time return in 3 weeks for follow-up.          Follow up plan: Return in about 3 weeks (around 09/13/2020) for Rash follow-up.

## 2020-08-23 NOTE — Patient Instructions (Signed)
Rash, Adult  A rash is a change in the color of your skin. A rash can also change the way your skin feels. There are many different conditions and factors that can cause a rash. Follow these instructions at home: The goal of treatment is to stop the itching and keep the rash from spreading. Watch for any changes in your symptoms. Let your doctor know about them. Follow these instructions to help with your condition: Medicine Take or apply over-the-counter and prescription medicines only as told by your doctor. These may include medicines:  To treat red or swollen skin (corticosteroid creams).  To treat itching.  To treat an allergy (oral antihistamines).  To treat very bad symptoms (oral corticosteroids).  Skin care  Put cool cloths (compresses) on the affected areas.  Do not scratch or rub your skin.  Avoid covering the rash. Make sure that the rash is exposed to air as much as possible. Managing itching and discomfort  Avoid hot showers or baths. These can make itching worse. A cold shower may help.  Try taking a bath with: ? Epsom salts. You can get these at your local pharmacy or grocery store. Follow the instructions on the package. ? Baking soda. Pour a small amount into the bath as told by your doctor. ? Colloidal oatmeal. You can get this at your local pharmacy or grocery store. Follow the instructions on the package.  Try putting baking soda paste onto your skin. Stir water into baking soda until it gets like a paste.  Try putting on a lotion that relieves itchiness (calamine lotion).  Keep cool and out of the sun. Sweating and being hot can make itching worse. General instructions   Rest as needed.  Drink enough fluid to keep your pee (urine) pale yellow.  Wear loose-fitting clothing.  Avoid scented soaps, detergents, and perfumes. Use gentle soaps, detergents, perfumes, and other cosmetic products.  Avoid anything that causes your rash. Keep a journal to  help track what causes your rash. Write down: ? What you eat. ? What cosmetic products you use. ? What you drink. ? What you wear. This includes jewelry.  Keep all follow-up visits as told by your doctor. This is important. Contact a doctor if:  You sweat at night.  You lose weight.  You pee (urinate) more than normal.  You pee less than normal, or you notice that your pee is a darker color than normal.  You feel weak.  You throw up (vomit).  Your skin or the whites of your eyes look yellow (jaundice).  Your skin: ? Tingles. ? Is numb.  Your rash: ? Does not go away after a few days. ? Gets worse.  You are: ? More thirsty than normal. ? More tired than normal.  You have: ? New symptoms. ? Pain in your belly (abdomen). ? A fever. ? Watery poop (diarrhea). Get help right away if:  You have a fever and your symptoms suddenly get worse.  You start to feel mixed up (confused).  You have a very bad headache or a stiff neck.  You have very bad joint pains or stiffness.  You have jerky movements that you cannot control (seizure).  Your rash covers all or most of your body. The rash may or may not be painful.  You have blisters that: ? Are on top of the rash. ? Grow larger. ? Grow together. ? Are painful. ? Are inside your nose or mouth.  You have a rash   that: ? Looks like purple pinprick-sized spots all over your body. ? Has a "bull's eye" or looks like a target. ? Is red and painful, causes your skin to peel, and is not from being in the sun too long. Summary  A rash is a change in the color of your skin. A rash can also change the way your skin feels.  The goal of treatment is to stop the itching and keep the rash from spreading.  Take or apply over-the-counter and prescription medicines only as told by your doctor.  Contact a doctor if you have new symptoms or symptoms that get worse.  Keep all follow-up visits as told by your doctor. This is  important. This information is not intended to replace advice given to you by your health care provider. Make sure you discuss any questions you have with your health care provider. Document Revised: 01/17/2019 Document Reviewed: 04/29/2018 Elsevier Patient Education  2020 Elsevier Inc.  

## 2020-08-23 NOTE — Assessment & Plan Note (Signed)
Acute x 3 weeks with no worsening.  No edema noted to BLE.  Rash noted, ?contact dermatitis.  Will send in script for Prednisone taper and Triamcinolone cream.  Educated him on each and use.  Recommend monitoring area if any worsening return to office immediately.  At this time return in 3 weeks for follow-up.

## 2020-08-25 ENCOUNTER — Telehealth: Payer: Self-pay | Admitting: Cardiovascular Disease

## 2020-08-25 MED ORDER — METOPROLOL SUCCINATE ER 25 MG PO TB24: 25.0000 mg | ORAL_TABLET | Freq: Every day | ORAL | 3 refills | Status: DC

## 2020-08-25 NOTE — Telephone Encounter (Signed)
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

## 2020-08-25 NOTE — Telephone Encounter (Signed)
I spoke with the patient regarding his monitor results and Dr. Windell Hummingbird recommendations to start metoprolol succinate 25 mg once daily.  The patient is agreeable to this recommendation and voices understanding.  I have advised him to please monitor his BP/ HR periodically, especially is having symptoms of dizziness/ lightheadedness and touch base with [place] if needed.   He is agreeable.

## 2020-08-25 NOTE — Telephone Encounter (Signed)
The patient's monitor has been uploaded for Dr. Mariah Milling to review.  Message sent to MD to please sign off so we may contact the patient.

## 2020-08-25 NOTE — Telephone Encounter (Signed)
Please call with montior results.

## 2020-09-13 ENCOUNTER — Encounter: Payer: Self-pay | Admitting: Nurse Practitioner

## 2020-09-13 ENCOUNTER — Other Ambulatory Visit: Payer: Self-pay

## 2020-09-13 ENCOUNTER — Ambulatory Visit (INDEPENDENT_AMBULATORY_CARE_PROVIDER_SITE_OTHER): Payer: Medicare Other | Admitting: Nurse Practitioner

## 2020-09-13 DIAGNOSIS — R21 Rash and other nonspecific skin eruption: Secondary | ICD-10-CM | POA: Diagnosis not present

## 2020-09-13 NOTE — Assessment & Plan Note (Signed)
Acute and improved.  Continue Triamcinolone cream as needed.  Educated him use.  Recommend monitoring area if any worsening return to office immediately.  At this time return as scheduled in March.

## 2020-09-13 NOTE — Progress Notes (Signed)
BP 137/78   Pulse 61   Temp 98.4 F (36.9 C)   Wt 205 lb 9.6 oz (93.3 kg)   SpO2 96%   BMI 30.36 kg/m    Subjective:    Patient ID: William Murray, male    DOB: 08/10/1951, 69 y.o.   MRN: [id number]  HPI: William Murray is a 69 y.o. male  Chief Complaint  Patient presents with  . Rash    pt states rash is doing a lot better   RASH & EDEMA BILATERAL LEGS Follow-up today for rash to legs, initially seen 08/23/20 and treated with Prednisone and Triamcinolone. At this time improved rash.  Last Visit Notes:  Started 2 weeks ago to both lower legs (knees dow).  Reports he does have some itching and burning with the rash + his son felt there was some swelling.  Has not been outside doing yard work that he recalls.  He denies SOB, CP, palpitations, or orthopnea. Duration:  weeks  Location: legs  Itching: none Burning: none Redness: improved Oozing: no Scaling: no Blisters: no Painful: no Fevers: no Change in detergents/soaps/personal care products: no Recent illness: no Recent travel:no History of same: a few years back he had similar -- after walking on concrete -- worse then current rash Context: improved Alleviating factors: nothing Treatments attempted:nothing Shortness of breath: no  Throat/tongue swelling: no Myalgias/arthralgias: no  Relevant past medical, surgical, family and social history reviewed and updated as indicated. Interim medical history since our last visit reviewed. Allergies and medications reviewed and updated.  Review of Systems  Constitutional: Negative for activity change, diaphoresis, fatigue and fever.  Respiratory: Negative for cough, chest tightness, shortness of breath and wheezing.   Cardiovascular: Negative for chest pain, palpitations and leg swelling.  Gastrointestinal: Negative.   Skin: Negative for rash (improved).  Neurological: Negative.   Psychiatric/Behavioral: Negative.     Per HPI unless specifically indicated above      Objective:    BP 137/78   Pulse 61   Temp 98.4 F (36.9 C)   Wt 205 lb 9.6 oz (93.3 kg)   SpO2 96%   BMI 30.36 kg/m   Wt Readings from Last 3 Encounters:  09/13/20 205 lb 9.6 oz (93.3 kg)  08/23/20 205 lb 6.4 oz (93.2 kg)  07/20/20 201 lb (91.2 kg)    Physical Exam Vitals and nursing note reviewed.  Constitutional:      General: He is awake. He is not in acute distress.    Appearance: He is well-developed. He is obese. He is not ill-appearing.  HENT:     Head: Normocephalic and atraumatic.     Right Ear: Hearing normal. No drainage.     Left Ear: Hearing normal. No drainage.  Eyes:     General: Lids are normal.        Right eye: No discharge.        Left eye: No discharge.     Conjunctiva/sclera: Conjunctivae normal.     Pupils: Pupils are equal, round, and reactive to light.  Neck:     Vascular: No carotid bruit.  Cardiovascular:     Rate and Rhythm: Normal rate and regular rhythm.     Heart sounds: Normal heart sounds, S1 normal and S2 normal. No murmur heard.  No gallop.   Pulmonary:     Effort: Pulmonary effort is normal. No accessory muscle usage or respiratory distress.     Breath sounds: Normal breath sounds.  Abdominal:  General: Bowel sounds are normal.     Palpations: Abdomen is soft.  Musculoskeletal:        General: Normal range of motion.     Right hand: Normal.     Left hand: Normal.     Cervical back: Normal range of motion and neck supple.     Right lower leg: No edema.     Left lower leg: No edema.  Skin:    General: Skin is warm and dry.     Findings: Rash present. Rash is macular. Rash is not crusting, scaling or vesicular.     Comments: Macular, flat scattered rash to bilateral lower extremities, from knee to ankle.  Areas crusted over now, no erythema or warmth to legs.  Improved from previous visit.  Neurological:     Mental Status: He is alert and oriented to person, place, and time.  Psychiatric:        Attention and Perception:  Attention normal.        Mood and Affect: Mood normal.        Speech: Speech normal.        Behavior: Behavior normal. Behavior is cooperative.        Thought Content: Thought content normal.    Results for orders placed or performed in visit on 06/21/20  Basic metabolic panel  Result Value Ref Range   Glucose 100 (H) 65 - 99 mg/dL   BUN 11 8 - 27 mg/dL   Creatinine, Ser 8.54 0.76 - 1.27 mg/dL   GFR calc non Af Amer 76 >59 mL/min/1.73   GFR calc Af Amer 88 >59 mL/min/1.73   BUN/Creatinine Ratio 11 10 - 24   Sodium 140 134 - 144 mmol/L   Potassium 4.0 3.5 - 5.2 mmol/L   Chloride 104 96 - 106 mmol/L   CO2 21 20 - 29 mmol/L   Calcium 9.6 8.6 - 10.2 mg/dL  Lipid Panel w/o Chol/HDL Ratio  Result Value Ref Range   Cholesterol, Total 121 100 - 199 mg/dL   Triglycerides 627 (H) 0 - 149 mg/dL   HDL 37 (L) >03 mg/dL   VLDL Cholesterol Cal 35 5 - 40 mg/dL   LDL Chol Calc (NIH) 49 0 - 99 mg/dL  TSH  Result Value Ref Range   TSH 4.490 0.450 - 4.500 uIU/mL  Uric acid  Result Value Ref Range   Uric Acid 5.2 3.8 - 8.4 mg/dL  PSA  Result Value Ref Range   Prostate Specific Ag, Serum 1.0 0.0 - 4.0 ng/mL      Assessment & Plan:   Problem List Items Addressed This Visit      Musculoskeletal and Integument   Rash    Acute and improved.  Continue Triamcinolone cream as needed.  Educated him use.  Recommend monitoring area if any worsening return to office immediately.  At this time return as scheduled in March.          Follow up plan: Return for as scheduled in March 2022.

## 2020-09-13 NOTE — Patient Instructions (Signed)
Rash, Adult  A rash is a change in the color of your skin. A rash can also change the way your skin feels. There are many different conditions and factors that can cause a rash. Follow these instructions at home: The goal of treatment is to stop the itching and keep the rash from spreading. Watch for any changes in your symptoms. Let your doctor know about them. Follow these instructions to help with your condition: Medicine Take or apply over-the-counter and prescription medicines only as told by your doctor. These may include medicines:  To treat red or swollen skin (corticosteroid creams).  To treat itching.  To treat an allergy (oral antihistamines).  To treat very bad symptoms (oral corticosteroids).  Skin care  Put cool cloths (compresses) on the affected areas.  Do not scratch or rub your skin.  Avoid covering the rash. Make sure that the rash is exposed to air as much as possible. Managing itching and discomfort  Avoid hot showers or baths. These can make itching worse. A cold shower may help.  Try taking a bath with: ? Epsom salts. You can get these at your local pharmacy or grocery store. Follow the instructions on the package. ? Baking soda. Pour a small amount into the bath as told by your doctor. ? Colloidal oatmeal. You can get this at your local pharmacy or grocery store. Follow the instructions on the package.  Try putting baking soda paste onto your skin. Stir water into baking soda until it gets like a paste.  Try putting on a lotion that relieves itchiness (calamine lotion).  Keep cool and out of the sun. Sweating and being hot can make itching worse. General instructions   Rest as needed.  Drink enough fluid to keep your pee (urine) pale yellow.  Wear loose-fitting clothing.  Avoid scented soaps, detergents, and perfumes. Use gentle soaps, detergents, perfumes, and other cosmetic products.  Avoid anything that causes your rash. Keep a journal to  help track what causes your rash. Write down: ? What you eat. ? What cosmetic products you use. ? What you drink. ? What you wear. This includes jewelry.  Keep all follow-up visits as told by your doctor. This is important. Contact a doctor if:  You sweat at night.  You lose weight.  You pee (urinate) more than normal.  You pee less than normal, or you notice that your pee is a darker color than normal.  You feel weak.  You throw up (vomit).  Your skin or the whites of your eyes look yellow (jaundice).  Your skin: ? Tingles. ? Is numb.  Your rash: ? Does not go away after a few days. ? Gets worse.  You are: ? More thirsty than normal. ? More tired than normal.  You have: ? New symptoms. ? Pain in your belly (abdomen). ? A fever. ? Watery poop (diarrhea). Get help right away if:  You have a fever and your symptoms suddenly get worse.  You start to feel mixed up (confused).  You have a very bad headache or a stiff neck.  You have very bad joint pains or stiffness.  You have jerky movements that you cannot control (seizure).  Your rash covers all or most of your body. The rash may or may not be painful.  You have blisters that: ? Are on top of the rash. ? Grow larger. ? Grow together. ? Are painful. ? Are inside your nose or mouth.  You have a rash   that: ? Looks like purple pinprick-sized spots all over your body. ? Has a "bull's eye" or looks like a target. ? Is red and painful, causes your skin to peel, and is not from being in the sun too long. Summary  A rash is a change in the color of your skin. A rash can also change the way your skin feels.  The goal of treatment is to stop the itching and keep the rash from spreading.  Take or apply over-the-counter and prescription medicines only as told by your doctor.  Contact a doctor if you have new symptoms or symptoms that get worse.  Keep all follow-up visits as told by your doctor. This is  important. This information is not intended to replace advice given to you by your health care provider. Make sure you discuss any questions you have with your health care provider. Document Revised: 01/17/2019 Document Reviewed: 04/29/2018 Elsevier Patient Education  2020 Elsevier Inc.  

## 2020-12-20 ENCOUNTER — Encounter: Payer: Self-pay | Admitting: Nurse Practitioner

## 2020-12-20 ENCOUNTER — Other Ambulatory Visit: Payer: Self-pay

## 2020-12-20 ENCOUNTER — Ambulatory Visit (INDEPENDENT_AMBULATORY_CARE_PROVIDER_SITE_OTHER): Payer: Medicare Other | Admitting: Nurse Practitioner

## 2020-12-20 VITALS — BP 129/73 | HR 77 | Temp 98.1°F | Wt 203.2 lb

## 2020-12-20 DIAGNOSIS — N400 Enlarged prostate without lower urinary tract symptoms: Secondary | ICD-10-CM

## 2020-12-20 DIAGNOSIS — J432 Centrilobular emphysema: Secondary | ICD-10-CM | POA: Diagnosis not present

## 2020-12-20 DIAGNOSIS — T148XXA Other injury of unspecified body region, initial encounter: Secondary | ICD-10-CM | POA: Diagnosis not present

## 2020-12-20 DIAGNOSIS — E7800 Pure hypercholesterolemia, unspecified: Secondary | ICD-10-CM

## 2020-12-20 DIAGNOSIS — I10 Essential (primary) hypertension: Secondary | ICD-10-CM

## 2020-12-20 DIAGNOSIS — I700 Atherosclerosis of aorta: Secondary | ICD-10-CM | POA: Diagnosis not present

## 2020-12-20 DIAGNOSIS — Z23 Encounter for immunization: Secondary | ICD-10-CM

## 2020-12-20 DIAGNOSIS — M1A0720 Idiopathic chronic gout, left ankle and foot, without tophus (tophi): Secondary | ICD-10-CM

## 2020-12-20 DIAGNOSIS — R7989 Other specified abnormal findings of blood chemistry: Secondary | ICD-10-CM

## 2020-12-20 DIAGNOSIS — Z87891 Personal history of nicotine dependence: Secondary | ICD-10-CM | POA: Diagnosis not present

## 2020-12-20 LAB — MICROALBUMIN, URINE WAIVED
Creatinine, Urine Waived: 200 mg/dL (ref 10–300)
Microalb, Ur Waived: 10 mg/L (ref 0–19)
Microalb/Creat Ratio: <30 mg/g (ref <30)

## 2020-12-20 MED ORDER — SHINGRIX 50 MCG/0.5ML IM SUSR: 0.5000 mL | Freq: Once | INTRAMUSCULAR | 0 refills | Status: AC

## 2020-12-20 NOTE — Assessment & Plan Note (Signed)
Chronic, stable with minimal Albuterol use.  Spirometry today notes mild disease FEV1 91% and FEV1/FVC 108%.  Continue current regimen and adjust as needed.  Continue annual CT lung screens until age 70.  Recommend continue cessation of smoking.  Return in 6 months.

## 2020-12-20 NOTE — Assessment & Plan Note (Signed)
Chronic, stable with addition of Allopurinol 100 MG daily, can adjust doses as needed.  Recheck uric acid level today and treat flares as needed.  Recommend monitoring diet and continue to cut back on alcohol use.

## 2020-12-20 NOTE — Assessment & Plan Note (Addendum)
Chronic, ongoing with BP at goal today. Continue Losartan 100 MG daily and Amlodipine 5 MG, try to avoid HCTZ due to gout history + avoid ACE due to cough history.  Will reach out to Dr. Mariah Milling about Metoprolol, as is causing fatigue, at this time recommend he reduce to 12.5 MG daily. Continue to monitor BP daily at home.  DASH diet at home.  Continue to collaborate with cardiology, upcoming visit.  Return in 6 months.  CMP, TSH, and urine ALB today.  May benefit from sleep study in future, declines today.  Return in 6 months, sooner if any changes.

## 2020-12-20 NOTE — Assessment & Plan Note (Signed)
Continue Lipitor and ASA daily for prevention.  Continue complete cessation of smoking.  CT scan yearly until age 70.

## 2020-12-20 NOTE — Assessment & Plan Note (Signed)
Chronic, stable.  Continue current medication regimen and adjust as needed.  Refills as needed. 

## 2020-12-20 NOTE — Assessment & Plan Note (Signed)
Recheck TSH today due to increased fatigue, suspect this is related to Metoprolol, but will check TSH today.

## 2020-12-20 NOTE — Progress Notes (Signed)
BP 129/73   Pulse 77   Temp 98.1 F (36.7 C) (Oral)   Wt 203 lb 3.2 oz (92.2 kg)   SpO2 99%   BMI 30.01 kg/m    Subjective:    Patient ID: William Murray, male    DOB: [birth year], 70 y.o.   MRN: [id number]  HPI: MATTHEO Murray is a 70 y.o. male  Chief Complaint  Patient presents with  . Hyperlipidemia  . Hypertension  . COPD  . Benign Prostatic Hypertrophy  . Follow-up    Patient states he would like to discuss being so fatigue a lot lately at night and in the mornings.   Has a small cut on right ring finger he got while working out in shop on metal, area is healing but would like tetanus updated.  HYPERTENSION Continues on Losartan 100 MG and Atorvastatin 10 MG daily.  Was started on Allopurinol in February 2021 due to report of pain and elevated uric acid level, since this time level trended down to 5 in March 2021 and pain improved.  In past reports taking one BP medication that caused cough, suspect an ACE.  Saw Dr. Mariah Milling last 07/20/20, had Zio due to PVC noted on EKG last PCP visit.  Was started on Metoprolol Succinate 25 MG daily.  Does endorse increase in fatigue lately after starting this medication.  When he wakes up in morning, he is tired and does not feel like doing anything.  He reports he continues to feel palpitations, known PVCs, that medication has not really helped.  Has never gone for sleep study.  He does endorse headaches at times, mild like, from frontal area to back of neck -- feels this is his sinuses due to weather. Hypertension status: stable  Satisfied with current treatment? yes Duration of hypertension: chronic BP monitoring frequency:  rarely BP range: 130/70 range BP medication side effects:  no Medication compliance: good compliance Previous BP meds: Losartan and HCTZ Aspirin: yes Recurrent headaches: no Visual changes: no Palpitations: no Dyspnea: no Chest pain: no Lower extremity edema: occasional at baseline Dizzy/lightheaded:  no The ASCVD Risk score Denman George DC Jr., et al., 2013) failed to calculate for the following reasons:   The valid total cholesterol range is 130 to 320 mg/dL   COPD Recent lung screening was done July 2021 -- noting mild centrilobular emphysema and aortic atherosclerosis.  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  Currently only use Albuterol as needed, has not had to use this year.  Has history of smoking, quit several years ago -- smoked about 20 years heavily.  FEV1 91% and FEV1/FVC 108% today. COPD status: controlled Satisfied with current treatment?: yes Oxygen use: no Dyspnea frequency:  none Cough frequency: none Rescue inhaler frequency:  none Limitation of activity: no Productive cough:  none Last Spirometry: 12/20/2020 -- FEV1 91% and FEV1/FVC 108% today Pneumovax: Up to Date Influenza: Up to Date   BPH Continues on Flomax 0.4 MG daily.   BPH status: stable Satisfied with current treatment?: yes Medication side effects: no Medication compliance: good compliance Duration: chronic Nocturia: 1/night Urinary frequency:no Incomplete voiding: no Urgency: no Weak urinary stream: no Straining to start stream: no Dysuria: no Onset: gradual Severity: mild Alleviating factors: Flomax Aggravating factors: unknown Treatments attempted: Flomax IPSS Questionnaire (AUA-7): Over the past month.   1)  How often have you had a sensation of not emptying your bladder completely  after you finish urinating?  0 - Not at all  2)  How often have you had to urinate again less than two hours after you finished urinating? 0 - Not at all  3)  How often have you found you stopped and started again several times when you urinated?  0 - Not at all  4) How difficult have you found it to postpone urination?  0 - Not at all  5) How often have you had a weak urinary stream?  0 - Not at all  6) How often have you had to push or strain to begin  urination?  0 - Not at all  7) How many times did you most typically get up to urinate from the time you went to bed until the time you got up in the morning?  1 - 1 time  Total score:  0-7 mildly symptomatic   8-19 moderately symptomatic   20-35 severely symptomatic    Relevant past medical, surgical, family and social history reviewed and updated as indicated. Interim medical history since our last visit reviewed. Allergies and medications reviewed and updated.  Review of Systems  Constitutional: Negative for activity change, diaphoresis, fatigue and fever.  Respiratory: Negative for cough, chest tightness, shortness of breath and wheezing.   Cardiovascular: Negative for chest pain, palpitations and leg swelling.  Gastrointestinal: Negative.   Musculoskeletal: Negative for arthralgias and joint swelling.  Neurological: Negative.   Psychiatric/Behavioral: Negative.     Per HPI unless specifically indicated above     Objective:    BP 129/73   Pulse 77   Temp 98.1 F (36.7 C) (Oral)   Wt 203 lb 3.2 oz (92.2 kg)   SpO2 99%   BMI 30.01 kg/m   Wt Readings from Last 3 Encounters:  12/20/20 203 lb 3.2 oz (92.2 kg)  09/13/20 205 lb 9.6 oz (93.3 kg)  08/23/20 205 lb 6.4 oz (93.2 kg)    Physical Exam Vitals and nursing note reviewed.  Constitutional:      General: He is awake. He is not in acute distress.    Appearance: He is well-developed. He is obese. He is not ill-appearing.  HENT:     Head: Normocephalic and atraumatic.     Right Ear: Hearing normal. No drainage.     Left Ear: Hearing normal. No drainage.  Eyes:     General: Lids are normal.        Right eye: No discharge.        Left eye: No discharge.     Conjunctiva/sclera: Conjunctivae normal.     Pupils: Pupils are equal, round, and reactive to light.  Neck:     Vascular: No carotid bruit.  Cardiovascular:     Rate and Rhythm: Normal rate and regular rhythm.     Heart sounds: Normal heart sounds, S1 normal  and S2 normal. No murmur heard. No gallop.   Pulmonary:     Effort: Pulmonary effort is normal. No accessory muscle usage or respiratory distress.     Breath sounds: Normal breath sounds.  Abdominal:     General: Bowel sounds are normal.     Palpations: Abdomen is soft.  Musculoskeletal:        General: Normal range of motion.     Right hand: Normal.     Left hand: Normal.     Cervical back: Normal range of motion and neck supple.     Right lower leg: Edema (trace at sock line) present.  Left lower leg: Edema (trace at sock line) present.  Skin:    General: Skin is warm and dry.  Neurological:     Mental Status: He is alert and oriented to person, place, and time.  Psychiatric:        Mood and Affect: Mood normal.        Behavior: Behavior normal. Behavior is cooperative.        Thought Content: Thought content normal.        Judgment: Judgment normal.    Results for orders placed or performed in visit on 06/21/20  Basic metabolic panel  Result Value Ref Range   Glucose 100 (H) 65 - 99 mg/dL   BUN 11 8 - 27 mg/dL   Creatinine, Ser 1.88 0.76 - 1.27 mg/dL   GFR calc non Af Amer 76 >59 mL/min/1.73   GFR calc Af Amer 88 >59 mL/min/1.73   BUN/Creatinine Ratio 11 10 - 24   Sodium 140 134 - 144 mmol/L   Potassium 4.0 3.5 - 5.2 mmol/L   Chloride 104 96 - 106 mmol/L   CO2 21 20 - 29 mmol/L   Calcium 9.6 8.6 - 10.2 mg/dL  Lipid Panel w/o Chol/HDL Ratio  Result Value Ref Range   Cholesterol, Total 121 100 - 199 mg/dL   Triglycerides 416 (H) 0 - 149 mg/dL   HDL 37 (L) >60 mg/dL   VLDL Cholesterol Cal 35 5 - 40 mg/dL   LDL Chol Calc (NIH) 49 0 - 99 mg/dL  TSH  Result Value Ref Range   TSH 4.490 0.450 - 4.500 uIU/mL  Uric acid  Result Value Ref Range   Uric Acid 5.2 3.8 - 8.4 mg/dL  PSA  Result Value Ref Range   Prostate Specific Ag, Serum 1.0 0.0 - 4.0 ng/mL      Assessment & Plan:   Problem List Items Addressed This Visit      Cardiovascular and Mediastinum    Essential hypertension, benign    Chronic, ongoing with BP at goal today. Continue Losartan 100 MG daily and Amlodipine 5 MG, try to avoid HCTZ due to gout history + avoid ACE due to cough history.  Will reach out to Dr. Mariah Milling about Metoprolol, as is causing fatigue, at this time recommend he reduce to 12.5 MG daily. Continue to monitor BP daily at home.  DASH diet at home.  Continue to collaborate with cardiology, upcoming visit.  Return in 6 months.  CMP, TSH, and urine ALB today.  May benefit from sleep study in future, declines today.  Return in 6 months, sooner if any changes.      Relevant Orders   Comprehensive metabolic panel   Microalbumin, Urine Waived   TSH   Aortic atherosclerosis (HCC)    Continue Lipitor and ASA daily for prevention.  Continue complete cessation of smoking.  CT scan yearly until age 19.        Respiratory   Centrilobular emphysema (HCC) - Primary    Chronic, stable with minimal Albuterol use.  Spirometry today notes mild disease FEV1 91% and FEV1/FVC 108%.  Continue current regimen and adjust as needed.  Continue annual CT lung screens until age 64.  Recommend continue cessation of smoking.  Return in 6 months.      Relevant Orders   CBC with Differential/Platelet   Spirometry with graph (Completed)     Genitourinary   BPH (benign prostatic hyperplasia)    Chronic, stable.  Continue current medication regimen and adjust as  needed.  Refills as needed.        Other   Hypercholesteremia    Chronic, ongoing.  Continue current medication regimen and adjust as needed.  Lipid panel today, fasting.      Relevant Orders   Lipid Panel w/o Chol/HDL Ratio   Chronic gout    Chronic, stable with addition of Allopurinol 100 MG daily, can adjust doses as needed.  Recheck uric acid level today and treat flares as needed.  Recommend monitoring diet and continue to cut back on alcohol use.      Relevant Orders   Uric acid   History of smoking    Continue  cessation of smoking and yearly CT scan until age 15.      Elevated TSH    Recheck TSH today due to increased fatigue, suspect this is related to Metoprolol, but will check TSH today.       Other Visit Diagnoses    Abrasion       To right ring finger -- healed at this time.  Will update Td today.       Follow up plan: Return in about 6 months (around 06/22/2021) for Annual physical.

## 2020-12-20 NOTE — Patient Instructions (Signed)

## 2020-12-20 NOTE — Assessment & Plan Note (Signed)
Continue cessation of smoking and yearly CT scan until age 70.

## 2020-12-20 NOTE — Assessment & Plan Note (Signed)
Chronic, ongoing.  Continue current medication regimen and adjust as needed.  Lipid panel today, fasting.

## 2020-12-21 NOTE — Progress Notes (Signed)
Good morning, please let William Murray know his labs have returned.  Kidney and liver function are normal.  Cholesterol levels show LDL at goal, continue Atorvastatin daily.  Uric acid continues to be in normal range, continue Allopurinol as ordered.  CBC shows normal white blood cell count, but mild elevation in lymphocytes (one of cells that helps with infection), this was mildly elevated last visit too.  No changes needed at this time.  We will recheck next visit.  Any questions? Keep being awesome!!  Thank you for allowing me to participate in your care. Kindest regards, William Murray

## 2020-12-22 LAB — COMPREHENSIVE METABOLIC PANEL
ALT: 16 IU/L (ref 0–44)
AST: 29 IU/L (ref 0–40)
Albumin/Globulin Ratio: 1.6 (ref 1.2–2.2)
Albumin: 4.6 g/dL (ref 3.7–4.7)
Alkaline Phosphatase: 104 IU/L (ref 44–121)
BUN/Creatinine Ratio: 11 (ref 10–24)
BUN: 11 mg/dL (ref 8–27)
Bilirubin Total: 0.9 mg/dL (ref 0.0–1.2)
CO2: 20 mmol/L (ref 20–29)
Calcium: 9.3 mg/dL (ref 8.6–10.2)
Chloride: 105 mmol/L (ref 96–106)
Creatinine, Ser: 1.03 mg/dL (ref 0.76–1.27)
Globulin, Total: 2.9 g/dL (ref 1.5–4.5)
Glucose: 108 mg/dL — ABNORMAL HIGH (ref 65–99)
Potassium: 4.6 mmol/L (ref 3.5–5.2)
Sodium: 143 mmol/L (ref 134–144)
Total Protein: 7.5 g/dL (ref 6.0–8.5)
eGFR: 78 mL/min/{1.73_m2} (ref >59)

## 2020-12-22 LAB — CBC WITH DIFFERENTIAL/PLATELET
Basophils Absolute: 0.1 10*3/uL (ref 0.0–0.2)
Basos: 1 %
EOS (ABSOLUTE): 0.2 10*3/uL (ref 0.0–0.4)
Eos: 2 %
Hematocrit: 50.4 % (ref 37.5–51.0)
Hemoglobin: 16.9 g/dL (ref 13.0–17.7)
Immature Grans (Abs): 0 10*3/uL (ref 0.0–0.1)
Immature Granulocytes: 0 %
Lymphocytes Absolute: 4 10*3/uL — ABNORMAL HIGH (ref 0.7–3.1)
Lymphs: 41 %
MCH: 32.5 pg (ref 26.6–33.0)
MCHC: 33.5 g/dL (ref 31.5–35.7)
MCV: 97 fL (ref 79–97)
Monocytes Absolute: 0.6 10*3/uL (ref 0.1–0.9)
Monocytes: 6 %
Neutrophils Absolute: 4.8 10*3/uL (ref 1.4–7.0)
Neutrophils: 50 %
Platelets: 216 10*3/uL (ref 150–450)
RBC: 5.2 x10E6/uL (ref 4.14–5.80)
RDW: 12.4 % (ref 11.6–15.4)
WBC: 9.7 10*3/uL (ref 3.4–10.8)

## 2020-12-22 LAB — LIPID PANEL W/O CHOL/HDL RATIO
Cholesterol, Total: 115 mg/dL (ref 100–199)
HDL: 39 mg/dL — ABNORMAL LOW (ref >39)
LDL Chol Calc (NIH): 49 mg/dL (ref 0–99)
Triglycerides: 156 mg/dL — ABNORMAL HIGH (ref 0–149)
VLDL Cholesterol Cal: 27 mg/dL (ref 5–40)

## 2020-12-22 LAB — TSH: TSH: 5.23 u[IU]/mL — ABNORMAL HIGH (ref 0.450–4.500)

## 2020-12-22 LAB — URIC ACID: Uric Acid: 5.4 mg/dL (ref 3.8–8.4)

## 2020-12-22 NOTE — Progress Notes (Signed)
Please let Donzel know his thyroid lab returned and it is mildly elevated again, we will recheck this at next visit to see if remains elevated and determine next steps.  Have a great day!!

## 2021-01-27 ENCOUNTER — Other Ambulatory Visit: Payer: Self-pay | Admitting: Nurse Practitioner

## 2021-02-18 LAB — FECAL OCCULT BLOOD, IMMUNOCHEMICAL: IFOBT: NEGATIVE

## 2021-05-16 ENCOUNTER — Telehealth: Payer: Self-pay

## 2021-05-16 ENCOUNTER — Ambulatory Visit: Payer: Medicare Other

## 2021-05-16 NOTE — Telephone Encounter (Signed)
This nurse called patient in order to perform scheduled telephonic AWV. He states that this is not a good time because he is mad at [place]. He said that he received a text message that told him to come in to the office. He is not wanting to talk about anything.

## 2021-06-04 ENCOUNTER — Other Ambulatory Visit: Payer: Self-pay | Admitting: Nurse Practitioner

## 2021-06-23 ENCOUNTER — Ambulatory Visit (INDEPENDENT_AMBULATORY_CARE_PROVIDER_SITE_OTHER): Payer: Medicare Other | Admitting: Nurse Practitioner

## 2021-06-23 ENCOUNTER — Other Ambulatory Visit: Payer: Self-pay

## 2021-06-23 ENCOUNTER — Encounter: Payer: Self-pay | Admitting: Nurse Practitioner

## 2021-06-23 VITALS — BP 135/69 | HR 71 | Temp 98.5°F | Ht 68.5 in | Wt 201.4 lb

## 2021-06-23 DIAGNOSIS — I700 Atherosclerosis of aorta: Secondary | ICD-10-CM | POA: Diagnosis not present

## 2021-06-23 DIAGNOSIS — K760 Fatty (change of) liver, not elsewhere classified: Secondary | ICD-10-CM | POA: Diagnosis not present

## 2021-06-23 DIAGNOSIS — N281 Cyst of kidney, acquired: Secondary | ICD-10-CM | POA: Diagnosis not present

## 2021-06-23 DIAGNOSIS — I10 Essential (primary) hypertension: Secondary | ICD-10-CM | POA: Diagnosis not present

## 2021-06-23 DIAGNOSIS — Z1211 Encounter for screening for malignant neoplasm of colon: Secondary | ICD-10-CM

## 2021-06-23 DIAGNOSIS — E7800 Pure hypercholesterolemia, unspecified: Secondary | ICD-10-CM | POA: Diagnosis not present

## 2021-06-23 DIAGNOSIS — Z23 Encounter for immunization: Secondary | ICD-10-CM | POA: Diagnosis not present

## 2021-06-23 DIAGNOSIS — Z0000 Encounter for general adult medical examination without abnormal findings: Secondary | ICD-10-CM | POA: Diagnosis not present

## 2021-06-23 DIAGNOSIS — M1A9XX0 Chronic gout, unspecified, without tophus (tophi): Secondary | ICD-10-CM

## 2021-06-23 DIAGNOSIS — R7989 Other specified abnormal findings of blood chemistry: Secondary | ICD-10-CM | POA: Diagnosis not present

## 2021-06-23 DIAGNOSIS — J432 Centrilobular emphysema: Secondary | ICD-10-CM

## 2021-06-23 DIAGNOSIS — R7301 Impaired fasting glucose: Secondary | ICD-10-CM

## 2021-06-23 DIAGNOSIS — N400 Enlarged prostate without lower urinary tract symptoms: Secondary | ICD-10-CM

## 2021-06-23 DIAGNOSIS — Z87891 Personal history of nicotine dependence: Secondary | ICD-10-CM | POA: Diagnosis not present

## 2021-06-23 LAB — MICROALBUMIN, URINE WAIVED
Creatinine, Urine Waived: 50 mg/dL (ref 10–300)
Microalb, Ur Waived: 10 mg/L (ref 0–19)
Microalb/Creat Ratio: <30 mg/g (ref <30)

## 2021-06-23 MED ORDER — LOSARTAN POTASSIUM 100 MG PO TABS: 100.0000 mg | ORAL_TABLET | Freq: Every day | ORAL | 4 refills | Status: DC

## 2021-06-23 MED ORDER — ATORVASTATIN CALCIUM 10 MG PO TABS: 10.0000 mg | ORAL_TABLET | Freq: Every day | ORAL | 4 refills | Status: DC

## 2021-06-23 MED ORDER — AMLODIPINE BESYLATE 5 MG PO TABS: 5.0000 mg | ORAL_TABLET | Freq: Every day | ORAL | 4 refills | Status: DC

## 2021-06-23 MED ORDER — TAMSULOSIN HCL 0.4 MG PO CAPS: 0.4000 mg | ORAL_CAPSULE | Freq: Every day | ORAL | 4 refills | Status: DC

## 2021-06-23 MED ORDER — ALLOPURINOL 100 MG PO TABS: 100.0000 mg | ORAL_TABLET | Freq: Every day | ORAL | 4 refills | Status: DC

## 2021-06-23 MED ORDER — METOPROLOL SUCCINATE ER 25 MG PO TB24: 25.0000 mg | ORAL_TABLET | Freq: Every day | ORAL | 4 refills | Status: DC

## 2021-06-23 NOTE — Assessment & Plan Note (Signed)
Chronic, stable with addition of Allopurinol 100 MG daily, can adjust doses as needed.  Recheck uric acid level today and treat flares as needed.  Recommend monitoring diet and continue to cut back on alcohol use. ?

## 2021-06-23 NOTE — Assessment & Plan Note (Signed)
Continue cessation of smoking and yearly CT scan until age 70 -- reached out to screening team to schedule him.

## 2021-06-23 NOTE — Assessment & Plan Note (Signed)
Chronic, stable with minimal Albuterol use.  Spirometry in 2022 =  mild disease FEV1 91% and FEV1/FVC 108%.  Continue current regimen and adjust as needed.  Continue annual CT lung screens until age 70 -- reached out to lung screening team to scheduled as he has not heard from them and was due in July.  Recommend continue cessation of smoking.  Return in 6 months.

## 2021-06-23 NOTE — Assessment & Plan Note (Signed)
Continue Lipitor and ASA daily for prevention.  Continue complete cessation of smoking.  CT scan yearly until age 70 -- has not received yet this year or heard to schedule, reached out to lung screening team.

## 2021-06-23 NOTE — Progress Notes (Signed)
BP 135/69   Pulse 71   Temp 98.5 F (36.9 C) (Oral)   Ht 5' 8.5" (1.74 m)   Wt 201 lb 6.4 oz (91.4 kg)   SpO2 96%   BMI 30.18 kg/m    Subjective:    Patient ID: William Murray, male    DOB: [birth year], 70 y.o.   MRN: [id number]  HPI: William Murray is a 70 y.o. male presenting on 06/23/2021 for comprehensive medical examination. Current medical complaints include:none  He currently lives with: wife Interim Problems from his last visit: no  HYPERTENSION Continues on Losartan 100 MG, Metoprolol XL 25 MG daily, Amlodipine 5 MG daily, and Atorvastatin 10 MG daily. Taking Allopurinol 100 MG daily for history of uric acid elevation with joint pain -- this has offered some benefit.  Last saw cardiology in October 2021, is to return as needed. Hypertension status: stable  Satisfied with current treatment? yes Duration of hypertension: chronic BP monitoring frequency:  rarely BP range: 130/70 range BP medication side effects:  no Medication compliance: good compliance Previous BP meds: Losartan and HCTZ Aspirin: yes Recurrent headaches: no Visual changes: no Palpitations: no Dyspnea: no Chest pain: no Lower extremity edema: occasional at baseline Dizzy/lightheaded: no  COPD Last lung screening was done July 2021 -- noting mild centrilobular emphysema and aortic atherosclerosis.    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   Currently only use Albuterol as needed, has not had to use this year.  Has history of smoking, quit several years ago -- smoked about 20 years heavily.   COPD status: controlled Satisfied with current treatment?: yes Oxygen use: no Dyspnea frequency:  none Cough frequency: none Rescue inhaler frequency:  none Limitation of activity: no Productive cough:  none Last Spirometry: 12/20/2020  Pneumovax: Up to Date Influenza: Up to Date    BPH Continues on Flomax 0.4 MG daily.   BPH status:  stable Satisfied with current treatment?: yes Medication side effects: no Medication compliance: good compliance Duration: chronic Nocturia: 1/night Urinary frequency:no Incomplete voiding: no Urgency: no Weak urinary stream: no Straining to start stream: no Dysuria: no Onset: gradual Severity: mild Alleviating factors: Flomax Aggravating factors: unknown Treatments attempted: Flomax IPSS Questionnaire (AUA-7): Over the past month.   1)  How often have you had a sensation of not emptying your bladder completely after you finish urinating?  0 - Not at all  2)  How often have you had to urinate again less than two hours after you finished urinating? 0 - Not at all  3)  How often have you found you stopped and started again several times when you urinated?  0 - Not at all  4) How difficult have you found it to postpone urination?  0 - Not at all  5) How often have you had a weak urinary stream?  0 - Not at all  6) How often have you had to push or strain to begin urination?  0 - Not at all  7) How many times did you most typically get up to urinate from the time you went to bed until the time you got up in the morning?  1 - 1 time  Total score:  0-7 mildly symptomatic   8-19 moderately symptomatic   20-35 severely symptomatic     Functional Status Survey: Is the patient deaf or have difficulty hearing?: No Does the patient have difficulty seeing, even  when wearing glasses/contacts?: No Does the patient have difficulty concentrating, remembering, or making decisions?: No Does the patient have difficulty walking or climbing stairs?: No Does the patient have difficulty dressing or bathing?: No Does the patient have difficulty doing errands alone such as visiting a doctor's office or shopping?: No  FALL RISK: Fall Risk  06/23/2021 06/23/2021 05/14/2020 11/21/2019 10/24/2019  Falls in the past year? 0 0 0 0 -  Number falls in past yr: 0 0 - 0 0  Injury with Fall? 0 0 - 0 0  Risk for  fall due to : No Fall Risks No Fall Risks Medication side effect - -  Follow up Education provided Falls evaluation completed Falls evaluation completed;Education provided;Falls prevention discussed Falls evaluation completed -    Depression Screen Depression screen [facility] 2/9 06/23/2021 06/23/2021 05/14/2020 05/14/2019 05/08/2018  Decreased Interest 0 0 0 0 0  Down, Depressed, Hopeless 0 0 0 0 0  PHQ - 2 Score 0 0 0 0 0  Altered sleeping 0 - 0 - -  Tired, decreased energy 0 - 0 - -  Change in appetite 0 - 0 - -  Feeling bad or failure about yourself  0 - 0 - -  Trouble concentrating 0 - 0 - -  Moving slowly or fidgety/restless 0 - 0 - -  Suicidal thoughts 0 - 0 - -  PHQ-9 Score 0 - 0 - -  Difficult doing work/chores Not difficult at all - Not difficult at all - -    Advanced Directives <no information>  Past Medical History:  Past Medical History:  Diagnosis Date   Anginal pain (HCC)    Arthritis    COPD (chronic obstructive pulmonary disease) (HCC)    Erectile disorder due to medical condition in male    Gout    Hyperlipidemia    Hypertension    Renal cyst, right 09/14/2019   Shortness of breath dyspnea     Surgical History:  Past Surgical History:  Procedure Laterality Date   arm surgery Left    CARDIAC CATHETERIZATION     TONSILLECTOMY      Medications:  Current Outpatient Medications on File Prior to Visit  Medication Sig   acetaminophen (TYLENOL) 500 MG tablet Take 500 mg by mouth every 6 (six) hours as needed.   albuterol (PROVENTIL HFA;VENTOLIN HFA) 108 (90 Base) MCG/ACT inhaler Inhale 2 puffs into the lungs every 6 (six) hours as needed for wheezing or shortness of breath.   aspirin 81 MG tablet Take 1 tablet (81 mg total) by mouth daily.   magnesium oxide (MAG-OX) 400 MG tablet Take 800 mg by mouth every evening.   tiZANidine (ZANAFLEX) 4 MG tablet TAKE 1 TABLET (4 MG TOTAL) BY MOUTH EVERY 6 (SIX) HOURS AS NEEDED FOR MUSCLE SPASMS.   triamcinolone (KENALOG) 0.1 %  Apply 1 application topically 2 (two) times daily.   UNABLE TO FIND Take 2 tablets by mouth daily. Omega OX   No current facility-administered medications on file prior to visit.    Allergies:  No Known Allergies  Social History:  Social History   Socioeconomic History   Marital status: Married    Spouse name: Not on file   Number of children: Not on file   Years of education: Not on file   Highest education level: 9th grade  Occupational History   Occupation: retired  Tobacco Use   Smoking status: Former    Packs/day: 0.25    Years: 62.00  Pack years: 15.50    Types: Cigarettes    Quit date: 01/07/2018    Years since quitting: 3.4   Smokeless tobacco: Never  Vaping Use   Vaping Use: Never used  Substance and Sexual Activity   Alcohol use: Yes    Alcohol/week: 5.0 standard drinks    Types: 3 Cans of beer, 2 Shots of liquor per week    Comment: 2 shots of liquor  a week, one beer occasionally    Drug use: No   Sexual activity: Not Currently  Other Topics Concern   Not on file  Social History Narrative   Not on file   Social Determinants of Health   Financial Resource Strain: Low Risk    Difficulty of Paying Living Expenses: Not hard at all  Food Insecurity: No Food Insecurity   Worried About Programme researcher, broadcasting/film/video in the Last Year: Never true   Ran Out of Food in the Last Year: Never true  Transportation Needs: No Transportation Needs   Lack of Transportation (Medical): No   Lack of Transportation (Non-Medical): No  Physical Activity: Inactive   Days of Exercise per Week: 0 days   Minutes of Exercise per Session: 0 min  Stress: No Stress Concern Present   Feeling of Stress : Not at all  Social Connections: Moderately Integrated   Frequency of Communication with Friends and Family: More than three times a week   Frequency of Social Gatherings with Friends and Family: More than three times a week   Attends Religious Services: More than 4 times per year   Active  Member of [employer] or Organizations: No   Attends Engineer, structural: Never   Marital Status: Married  Catering manager Violence: Not At Risk   Fear of Current or Ex-Partner: No   Emotionally Abused: No   Physically Abused: No   Sexually Abused: No   Social History   Tobacco Use  Smoking Status Former   Packs/day: 0.25   Years: 62.00   Pack years: 15.50   Types: Cigarettes   Quit date: 01/07/2018   Years since quitting: 3.4  Smokeless Tobacco Never   Social History   Substance and Sexual Activity  Alcohol Use Yes   Alcohol/week: 5.0 standard drinks   Types: 3 Cans of beer, 2 Shots of liquor per week   Comment: 2 shots of liquor  a week, one beer occasionally     Family History:  Family History  Problem Relation Age of Onset   Renal Disease Mother    Diabetes Mother    Dementia Father    COPD Father    Emphysema Father    Cervical cancer Sister    Diabetes Sister    Bladder Cancer Brother    Hypertension Son    Heart disease Son    Heart disease Maternal Grandmother    Heart disease Maternal Grandfather    Heart disease Paternal Grandmother    Heart disease Paternal Grandfather    Aneurysm Sister    Hypertension Son     Past medical history, surgical history, medications, allergies, family history and social history reviewed with patient today and changes made to appropriate areas of the chart.   ROS All other ROS negative except what is listed above and in the HPI.      Objective:    BP 135/69   Pulse 71   Temp 98.5 F (36.9 C) (Oral)   Ht 5' 8.5" (1.74 m)   Wt 201 lb  6.4 oz (91.4 kg)   SpO2 96%   BMI 30.18 kg/m   Wt Readings from Last 3 Encounters:  06/23/21 201 lb 6.4 oz (91.4 kg)  12/20/20 203 lb 3.2 oz (92.2 kg)  09/13/20 205 lb 9.6 oz (93.3 kg)    No results found.  Physical Exam Vitals and nursing note reviewed.  Constitutional:      General: He is awake. He is not in acute distress.    Appearance: He is well-developed and  well-groomed. He is obese. He is not ill-appearing or toxic-appearing.  HENT:     Head: Normocephalic and atraumatic.     Right Ear: Hearing, tympanic membrane, ear canal and external ear normal. No drainage.     Left Ear: Hearing, tympanic membrane, ear canal and external ear normal. No drainage.     Nose: Nose normal.     Mouth/Throat:     Mouth: Mucous membranes are moist.  Eyes:     General: Lids are normal.        Right eye: No discharge.        Left eye: No discharge.     Extraocular Movements: Extraocular movements intact.     Conjunctiva/sclera: Conjunctivae normal.     Pupils: Pupils are equal, round, and reactive to light.     Visual Fields: Right eye visual fields normal and left eye visual fields normal.  Neck:     Thyroid: No thyromegaly.     Vascular: No carotid bruit or JVD.     Trachea: Trachea normal.  Cardiovascular:     Rate and Rhythm: Normal rate and regular rhythm.     Heart sounds: Normal heart sounds, S1 normal and S2 normal. No murmur heard.   No gallop.  Pulmonary:     Effort: Pulmonary effort is normal. No accessory muscle usage or respiratory distress.     Breath sounds: Normal breath sounds.  Abdominal:     General: Bowel sounds are normal.     Palpations: Abdomen is soft. There is no hepatomegaly or splenomegaly.     Tenderness: There is no abdominal tenderness.  Musculoskeletal:        General: Normal range of motion.     Cervical back: Normal range of motion and neck supple.     Right lower leg: No edema.     Left lower leg: No edema.  Lymphadenopathy:     Head:     Right side of head: No submental, submandibular, tonsillar, preauricular or posterior auricular adenopathy.     Left side of head: No submental, submandibular, tonsillar, preauricular or posterior auricular adenopathy.     Cervical: No cervical adenopathy.  Skin:    General: Skin is warm and dry.     Capillary Refill: Capillary refill takes less than 2 seconds.     Findings: No  rash.  Neurological:     Mental Status: He is alert and oriented to person, place, and time.     Cranial Nerves: Cranial nerves are intact.     Gait: Gait is intact.     Deep Tendon Reflexes: Reflexes are normal and symmetric.     Reflex Scores:      Brachioradialis reflexes are 2+ on the right side and 2+ on the left side.      Patellar reflexes are 2+ on the right side and 2+ on the left side. Psychiatric:        Attention and Perception: Attention normal.        Mood  and Affect: Mood normal.        Speech: Speech normal.        Behavior: Behavior normal. Behavior is cooperative.        Thought Content: Thought content normal.        Cognition and Memory: Cognition normal.        Judgment: Judgment normal.    6CIT Screen 06/23/2021 05/14/2020 05/08/2018 05/04/2017  What Year? 0 points 0 points 0 points 0 points  What month? 0 points 0 points 0 points 0 points  What time? 0 points 0 points 0 points 0 points  Count back from 20 0 points 0 points 0 points 0 points  Months in reverse 0 points 0 points 0 points 0 points  Repeat phrase 0 points 2 points 0 points 0 points  Total Score 0 2 0 0   Results for orders placed or performed in visit on 06/23/21  Microalbumin, Urine Waived  Result Value Ref Range   Microalb, Ur Waived 10 0 - 19 mg/L   Creatinine, Urine Waived 50 10 - 300 mg/dL   Microalb/Creat Ratio <30 <30 mg/g      Assessment & Plan:   Problem List Items Addressed This Visit       Cardiovascular and Mediastinum   Essential hypertension, benign    Chronic, ongoing with BP at goal today. Recommend he monitor BP at least a few mornings a week at home and document.  DASH diet at home.  Continue current medication regimen and adjust as needed.  Labs today: CBC, CMP, TSH, lipid.  Refills sent in.  Return in 6 months.       Relevant Medications   amLODipine (NORVASC) 5 MG tablet   atorvastatin (LIPITOR) 10 MG tablet   losartan (COZAAR) 100 MG tablet   metoprolol succinate  (TOPROL-XL) 25 MG 24 hr tablet   Other Relevant Orders   Microalbumin, Urine Waived (Completed)   Aortic atherosclerosis (HCC)    Continue Lipitor and ASA daily for prevention.  Continue complete cessation of smoking.  CT scan yearly until age 78 -- has not received yet this year or heard to schedule, reached out to lung screening team.      Relevant Medications   amLODipine (NORVASC) 5 MG tablet   atorvastatin (LIPITOR) 10 MG tablet   losartan (COZAAR) 100 MG tablet   metoprolol succinate (TOPROL-XL) 25 MG 24 hr tablet     Respiratory   Centrilobular emphysema (HCC) - Primary    Chronic, stable with minimal Albuterol use.  Spirometry in 2022 =  mild disease FEV1 91% and FEV1/FVC 108%.  Continue current regimen and adjust as needed.  Continue annual CT lung screens until age 6 -- reached out to lung screening team to scheduled as he has not heard from them and was due in July.  Recommend continue cessation of smoking.  Return in 6 months.      Relevant Orders   [facility] MEDICARE SCREENING   CBC with Differential/Platelet     Digestive   Hepatic steatosis    Discussed focus on healthy diet and modest weight loss + cut back on alcohol intake.  Check CMP today.        Genitourinary   Renal cyst, right    Noted on CT lung screening and remains stable in size.  Monitor closely and refer to nephrology as needed.      BPH (benign prostatic hyperplasia)    Chronic, stable with AUA = 1.  Continue  current medication regimen and adjust as needed.  Refills today.  Check PSA.      Relevant Medications   tamsulosin (FLOMAX) 0.4 MG CAPS capsule   Other Relevant Orders   PSA     Other   Hypercholesteremia    Chronic, ongoing.  Continue current medication regimen and adjust as needed.  Lipid panel today, fasting.  Refills sent in.      Relevant Medications   amLODipine (NORVASC) 5 MG tablet   atorvastatin (LIPITOR) 10 MG tablet   losartan (COZAAR) 100 MG tablet   metoprolol  succinate (TOPROL-XL) 25 MG 24 hr tablet   Other Relevant Orders   Comprehensive metabolic panel   Lipid Panel w/o Chol/HDL Ratio   Chronic gout    Chronic, stable with addition of Allopurinol 100 MG daily, can adjust doses as needed.  Recheck uric acid level today and treat flares as needed.  Recommend monitoring diet and continue to cut back on alcohol use.      Relevant Orders   Uric acid   History of smoking    Continue cessation of smoking and yearly CT scan until age 30 -- reached out to screening team to schedule him.      Relevant Orders   [facility] MEDICARE SCREENING   Elevated TSH    Recheck TSH today and Free T4, initiate medication as needed.  Educated patient on this.      Relevant Orders   TSH   T4, free   Other Visit Diagnoses     IFG (impaired fasting glucose)       Check A1c today.   Relevant Orders   HgB A1c   Microalbumin, Urine Waived (Completed)   Colon cancer screening       Cologuard ordered today.   Relevant Orders   Cologuard   Flu vaccine need       High dose flu vaccine today.   Relevant Orders   Flu Vaccine QUAD High Dose(Fluad)   Encounter for annual physical exam       Annual physical today with labs and health maintenance reviewed.        Discussed aspirin prophylaxis for myocardial infarction prevention and decision was made to continue ASA  LABORATORY TESTING:  Health maintenance labs ordered today as discussed above.   The natural history of prostate cancer and ongoing controversy regarding screening and potential treatment outcomes of prostate cancer has been discussed with the patient. The meaning of a false positive PSA and a false negative PSA has been discussed. He indicates understanding of the limitations of this screening test and wishes to proceed with screening PSA testing.   IMMUNIZATIONS:   - Tdap: Tetanus vaccination status reviewed: last tetanus booster within 10 years. - Influenza: Administered today - Pneumovax:  Up to date - Prevnar: Up to date - Zostavax vaccine: Up to date  SCREENING: - Colonoscopy: Ordered today  Discussed with patient purpose of the colonoscopy is to detect colon cancer at curable precancerous or early stages   - AAA Screening: Ordered today  -Hearing Test: Not applicable  -Spirometry: Up to date   PATIENT COUNSELING:    Sexuality: Discussed sexually transmitted diseases, partner selection, use of condoms, avoidance of unintended pregnancy  and contraceptive alternatives.   Advised to avoid cigarette smoking.  I discussed with the patient that most people either abstain from alcohol or drink within safe limits (<=14/week and <=4 drinks/occasion for males, <=7/weeks and <= 3 drinks/occasion for females) and that the risk  for alcohol disorders and other health effects rises proportionally with the number of drinks per week and how often a drinker exceeds daily limits.  Discussed cessation/primary prevention of drug use and availability of treatment for abuse.   Diet: Encouraged to adjust caloric intake to maintain  or achieve ideal body weight, to reduce intake of dietary saturated fat and total fat, to limit sodium intake by avoiding high sodium foods and not adding table salt, and to maintain adequate dietary potassium and calcium preferably from fresh fruits, vegetables, and low-fat dairy products.    Stressed the importance of regular exercise  Injury prevention: Discussed safety belts, safety helmets, smoke detector, smoking near bedding or upholstery.   Dental health: Discussed importance of regular tooth brushing, flossing, and dental visits.   Follow up plan: NEXT PREVENTATIVE PHYSICAL DUE IN 1 YEAR. Return in about 6 months (around 12/21/2021) for HTN/HLD, COPD, BPH, ELEVATED TSH.

## 2021-06-23 NOTE — Assessment & Plan Note (Signed)
Chronic, ongoing.  Continue current medication regimen and adjust as needed.  Lipid panel today, fasting.  Refills sent in. 

## 2021-06-23 NOTE — Assessment & Plan Note (Addendum)
Chronic, stable with AUA = 1.  Continue current medication regimen and adjust as needed.  Refills today.  Check PSA.

## 2021-06-23 NOTE — Patient Instructions (Signed)

## 2021-06-23 NOTE — Assessment & Plan Note (Signed)
Recheck TSH today and Free T4, initiate medication as needed.  Educated patient on this.

## 2021-06-23 NOTE — Assessment & Plan Note (Addendum)
Chronic, ongoing with BP at goal today. Recommend he monitor BP at least a few mornings a week at home and document.  DASH diet at home.  Continue current medication regimen and adjust as needed.  Labs today: CBC, CMP, TSH, lipid.  Refills sent in.  Return in 6 months.

## 2021-06-23 NOTE — Assessment & Plan Note (Signed)
Noted on CT lung screening and remains stable in size.  Monitor closely and refer to nephrology as needed. 

## 2021-06-23 NOTE — Assessment & Plan Note (Signed)
Discussed focus on healthy diet and modest weight loss + cut back on alcohol intake.  Check CMP today.

## 2021-06-24 ENCOUNTER — Other Ambulatory Visit: Payer: Self-pay | Admitting: Nurse Practitioner

## 2021-06-24 DIAGNOSIS — R7989 Other specified abnormal findings of blood chemistry: Secondary | ICD-10-CM

## 2021-06-24 LAB — CBC WITH DIFFERENTIAL/PLATELET
Basophils Absolute: 0.1 10*3/uL (ref 0.0–0.2)
Basos: 1 %
EOS (ABSOLUTE): 0.1 10*3/uL (ref 0.0–0.4)
Eos: 1 %
Hematocrit: 47.8 % (ref 37.5–51.0)
Hemoglobin: 16.8 g/dL (ref 13.0–17.7)
Immature Grans (Abs): 0 10*3/uL (ref 0.0–0.1)
Immature Granulocytes: 0 %
Lymphocytes Absolute: 3.5 10*3/uL — ABNORMAL HIGH (ref 0.7–3.1)
Lymphs: 37 %
MCH: 33.5 pg — ABNORMAL HIGH (ref 26.6–33.0)
MCHC: 35.1 g/dL (ref 31.5–35.7)
MCV: 95 fL (ref 79–97)
Monocytes Absolute: 0.6 10*3/uL (ref 0.1–0.9)
Monocytes: 7 %
Neutrophils Absolute: 5.1 10*3/uL (ref 1.4–7.0)
Neutrophils: 54 %
Platelets: 207 10*3/uL (ref 150–450)
RBC: 5.01 x10E6/uL (ref 4.14–5.80)
RDW: 12.7 % (ref 11.6–15.4)
WBC: 9.5 10*3/uL (ref 3.4–10.8)

## 2021-06-24 LAB — HEMOGLOBIN A1C
Est. average glucose Bld gHb Est-mCnc: 105 mg/dL
Hgb A1c MFr Bld: 5.3 % (ref 4.8–5.6)

## 2021-06-24 LAB — COMPREHENSIVE METABOLIC PANEL
ALT: 21 IU/L (ref 0–44)
AST: 40 IU/L (ref 0–40)
Albumin/Globulin Ratio: 1.6 (ref 1.2–2.2)
Albumin: 4.8 g/dL — ABNORMAL HIGH (ref 3.7–4.7)
Alkaline Phosphatase: 99 IU/L (ref 44–121)
BUN/Creatinine Ratio: 14 (ref 10–24)
BUN: 13 mg/dL (ref 8–27)
Bilirubin Total: 1 mg/dL (ref 0.0–1.2)
CO2: 23 mmol/L (ref 20–29)
Calcium: 9.6 mg/dL (ref 8.6–10.2)
Chloride: 102 mmol/L (ref 96–106)
Creatinine, Ser: 0.93 mg/dL (ref 0.76–1.27)
Globulin, Total: 3 g/dL (ref 1.5–4.5)
Glucose: 98 mg/dL (ref 65–99)
Potassium: 4.3 mmol/L (ref 3.5–5.2)
Sodium: 141 mmol/L (ref 134–144)
Total Protein: 7.8 g/dL (ref 6.0–8.5)
eGFR: 88 mL/min/{1.73_m2} (ref >59)

## 2021-06-24 LAB — PSA: Prostate Specific Ag, Serum: 0.9 ng/mL (ref 0.0–4.0)

## 2021-06-24 LAB — LIPID PANEL W/O CHOL/HDL RATIO
Cholesterol, Total: 125 mg/dL (ref 100–199)
HDL: 38 mg/dL — ABNORMAL LOW (ref >39)
LDL Chol Calc (NIH): 48 mg/dL (ref 0–99)
Triglycerides: 249 mg/dL — ABNORMAL HIGH (ref 0–149)
VLDL Cholesterol Cal: 39 mg/dL (ref 5–40)

## 2021-06-24 LAB — URIC ACID: Uric Acid: 5.6 mg/dL (ref 3.8–8.4)

## 2021-06-24 LAB — T4, FREE: Free T4: 1.19 ng/dL (ref 0.82–1.77)

## 2021-06-24 LAB — TSH: TSH: 5.12 u[IU]/mL — ABNORMAL HIGH (ref 0.450–4.500)

## 2021-06-24 NOTE — Progress Notes (Signed)
Good morning crew, please let William Murray know his labs have returned and overall are stable with exception of thyroid level continuing to be mildly elevated.  It is coming down though.  I would like him to return in 6 weeks for lab only visit to recheck this.  Please schedule.  Continue all current medications, no changes needed.  Have a fantastic day!! Keep being awesome!!  Thank you for allowing me to participate in your care.  I appreciate you. Kindest regards, Stayce Delancy

## 2021-06-25 ENCOUNTER — Ambulatory Visit (INDEPENDENT_AMBULATORY_CARE_PROVIDER_SITE_OTHER): Payer: Medicare Other

## 2021-06-25 DIAGNOSIS — Z0000 Encounter for general adult medical examination without abnormal findings: Secondary | ICD-10-CM | POA: Diagnosis not present

## 2021-06-25 NOTE — Patient Instructions (Signed)
Health Maintenance, Male Adopting a healthy lifestyle and getting preventive care are important in promoting health and wellness. Ask your health care provider about: The right schedule for you to have regular tests and exams. Things you can do on your own to prevent diseases and keep yourself healthy. What should I know about diet, weight, and exercise? Eat a healthy diet  Eat a diet that includes plenty of vegetables, fruits, low-fat dairy products, and lean protein. Do not eat a lot of foods that are high in solid fats, added sugars, or sodium. Maintain a healthy weight Body mass index (BMI) is a measurement that can be used to identify possible weight problems. It estimates body fat based on height and weight. Your health care provider can help determine your BMI and help you achieve or maintain a healthy weight. Get regular exercise Get regular exercise. This is one of the most important things you can do for your health. Most adults should: Exercise for at least 150 minutes each week. The exercise should increase your heart rate and make you sweat (moderate-intensity exercise). Do strengthening exercises at least twice a week. This is in addition to the moderate-intensity exercise. Spend less time sitting. Even light physical activity can be beneficial. Watch cholesterol and blood lipids Have your blood tested for lipids and cholesterol at 72 years of age, then have this test every 5 years. You may need to have your cholesterol levels checked more often if: Your lipid or cholesterol levels are high. You are older than 72 years of age. You are at high risk for heart disease. What should I know about cancer screening? Many types of cancers can be detected early and may often be prevented. Depending on your health history and family history, you may need to have cancer screening at various ages. This may include screening for: Colorectal cancer. Prostate cancer. Skin cancer. Lung  cancer. What should I know about heart disease, diabetes, and high blood pressure? Blood pressure and heart disease High blood pressure causes heart disease and increases the risk of stroke. This is more likely to develop in people who have high blood pressure readings, are of African descent, or are overweight. Talk with your health care provider about your target blood pressure readings. Have your blood pressure checked: Every 3-5 years if you are 18-39 years of age. Every year if you are 40 years old or older. If you are between the ages of 65 and 75 and are a current or former smoker, ask your health care provider if you should have a one-time screening for abdominal aortic aneurysm (AAA). Diabetes Have regular diabetes screenings. This checks your fasting blood sugar level. Have the screening done: Once every three years after age 45 if you are at a normal weight and have a low risk for diabetes. More often and at a younger age if you are overweight or have a high risk for diabetes. What should I know about preventing infection? Hepatitis B If you have a higher risk for hepatitis B, you should be screened for this virus. Talk with your health care provider to find out if you are at risk for hepatitis B infection. Hepatitis C Blood testing is recommended for: Everyone born from 1945 through 1965. Anyone with known risk factors for hepatitis C. Sexually transmitted infections (STIs) You should be screened each year for STIs, including gonorrhea and chlamydia, if: You are sexually active and are younger than 72 years of age. You are older than 72 years   of age and your health care provider tells you that you are at risk for this type of infection. Your sexual activity has changed since you were last screened, and you are at increased risk for chlamydia or gonorrhea. Ask your health care provider if you are at risk. Ask your health care provider about whether you are at high risk for HIV.  Your health care provider may recommend a prescription medicine to help prevent HIV infection. If you choose to take medicine to prevent HIV, you should first get tested for HIV. You should then be tested every 3 months for as long as you are taking the medicine. Follow these instructions at home: Lifestyle Do not use any products that contain nicotine or tobacco, such as cigarettes, e-cigarettes, and chewing tobacco. If you need help quitting, ask your health care provider. Do not use street drugs. Do not share needles. Ask your health care provider for help if you need support or information about quitting drugs. Alcohol use Do not drink alcohol if your health care provider tells you not to drink. If you drink alcohol: Limit how much you have to 0-2 drinks a day. Be aware of how much alcohol is in your drink. In the [place] one drink equals one 12 oz bottle of beer (355 mL), one 5 oz glass of wine (148 mL), or one 1 oz glass of hard liquor (44 mL). General instructions Schedule regular health, dental, and eye exams. Stay current with your vaccines. Tell your health care provider if: You often feel depressed. You have ever been abused or do not feel safe at home. Summary Adopting a healthy lifestyle and getting preventive care are important in promoting health and wellness. Follow your health care provider's instructions about healthy diet, exercising, and getting tested or screened for diseases. Follow your health care provider's instructions on monitoring your cholesterol and blood pressure. This information is not intended to replace advice given to you by your health care provider. Make sure you discuss any questions you have with your health care provider. Document Revised: 12/03/2020 Document Reviewed: 09/18/2018 Elsevier Patient Education  2022 Elsevier Inc.  

## 2021-06-25 NOTE — Progress Notes (Signed)
Subjective:   William Murray is a 70 y.o. male who presents for Medicare Annual/Subsequent preventive examination.  I connected with  William Murray on 06/25/21 by an audio only telemedicine application and verified that I am speaking with the correct person using two identifiers.   I discussed the limitations, risks, security and privacy concerns of performing an evaluation and management service by telephone and the availability of in person appointments. I also discussed with the patient that there may be a patient responsible charge related to this service. The patient expressed understanding and verbally consented to this telephonic visit.  Location of Patient: home Location of Provider: office  List any persons and their role that are participating in the visit with the patient.    Review of Systems    Defer to PCP Cardiac Risk Factors include: male gender;hypertension     Objective:    Today's Vitals   06/25/21 0955  PainSc: 0-No pain   There is no height or weight on file to calculate BMI.  Advanced Directives 06/25/2021 05/14/2020 05/14/2019 05/08/2018 05/04/2017 01/16/2017 04/28/2015  Does Patient Have a Medical Advance Directive? No;Yes Yes Yes No No No No  Type of Advance Directive Living will Healthcare Power of [place];Living will Living will;Healthcare Power of Attorney - - - -  Does patient want to make changes to medical advance directive? Yes (MAU/Ambulatory/Procedural Areas - Information given) - - - - - -  Copy of Healthcare Power of Attorney in Chart? No - copy requested No - copy requested Yes - validated most recent copy scanned in chart (See row information) - - - -  Would patient like information on creating a medical advance directive? No - Patient declined - - Yes (MAU/Ambulatory/Procedural Areas - Information given) Yes (MAU/Ambulatory/Procedural Areas - Information given) - Yes - Educational materials given    Current Medications (verified) Outpatient  Encounter Medications as of 06/25/2021  Medication Sig   acetaminophen (TYLENOL) 500 MG tablet Take 500 mg by mouth every 6 (six) hours as needed.   albuterol (PROVENTIL HFA;VENTOLIN HFA) 108 (90 Base) MCG/ACT inhaler Inhale 2 puffs into the lungs every 6 (six) hours as needed for wheezing or shortness of breath.   allopurinol (ZYLOPRIM) 100 MG tablet Take 1 tablet (100 mg total) by mouth daily.   amLODipine (NORVASC) 5 MG tablet Take 1 tablet (5 mg total) by mouth daily.   aspirin 81 MG tablet Take 1 tablet (81 mg total) by mouth daily.   atorvastatin (LIPITOR) 10 MG tablet Take 1 tablet (10 mg total) by mouth daily at 6 PM.   losartan (COZAAR) 100 MG tablet Take 1 tablet (100 mg total) by mouth daily.   magnesium oxide (MAG-OX) 400 MG tablet Take 800 mg by mouth every evening.   metoprolol succinate (TOPROL-XL) 25 MG 24 hr tablet Take 1 tablet (25 mg total) by mouth daily.   tamsulosin (FLOMAX) 0.4 MG CAPS capsule Take 1 capsule (0.4 mg total) by mouth daily.   tiZANidine (ZANAFLEX) 4 MG tablet TAKE 1 TABLET (4 MG TOTAL) BY MOUTH EVERY 6 (SIX) HOURS AS NEEDED FOR MUSCLE SPASMS.   triamcinolone (KENALOG) 0.1 % Apply 1 application topically 2 (two) times daily.   UNABLE TO FIND Take 2 tablets by mouth daily. Omega OX   No facility-administered encounter medications on file as of 06/25/2021.    Allergies (verified) Patient has no known allergies.   History: Past Medical History:  Diagnosis Date   Anginal pain (HCC)  Arthritis    COPD (chronic obstructive pulmonary disease) (HCC)    Erectile disorder due to medical condition in male    Gout    Hyperlipidemia    Hypertension    Renal cyst, right 09/14/2019   Shortness of breath dyspnea    Past Surgical History:  Procedure Laterality Date   arm surgery Left    CARDIAC CATHETERIZATION     TONSILLECTOMY     Family History  Problem Relation Age of Onset   Renal Disease Mother    Diabetes Mother    Dementia Father    COPD Father     Emphysema Father    Cervical cancer Sister    Diabetes Sister    Bladder Cancer Brother    Hypertension Son    Heart disease Son    Heart disease Maternal Grandmother    Heart disease Maternal Grandfather    Heart disease Paternal Grandmother    Heart disease Paternal Grandfather    Aneurysm Sister    Hypertension Son    Social History   Socioeconomic History   Marital status: Married    Spouse name: Not on file   Number of children: Not on file   Years of education: Not on file   Highest education level: 9th grade  Occupational History   Occupation: retired  Tobacco Use   Smoking status: Former    Packs/day: 0.25    Years: 62.00    Pack years: 15.50    Types: Cigarettes    Quit date: 01/07/2018    Years since quitting: 3.4   Smokeless tobacco: Never  Vaping Use   Vaping Use: Never used  Substance and Sexual Activity   Alcohol use: Yes    Alcohol/week: 5.0 standard drinks    Types: 3 Cans of beer, 2 Shots of liquor per week    Comment: 2 shots of liquor  a week, one beer occasionally    Drug use: No   Sexual activity: Not Currently  Other Topics Concern   Not on file  Social History Narrative   Not on file   Social Determinants of Health   Financial Resource Strain: Low Risk    Difficulty of Paying Living Expenses: Not hard at all  Food Insecurity: No Food Insecurity   Worried About Programme researcher, broadcasting/film/video in the Last Year: Never true   Ran Out of Food in the Last Year: Never true  Transportation Needs: No Transportation Needs   Lack of Transportation (Medical): No   Lack of Transportation (Non-Medical): No  Physical Activity: Insufficiently Active   Days of Exercise per Week: 3 days   Minutes of Exercise per Session: 10 min  Stress: No Stress Concern Present   Feeling of Stress : Not at all  Social Connections: Moderately Isolated   Frequency of Communication with Friends and Family: Twice a week   Frequency of Social Gatherings with Friends and Family:  Once a week   Attends Religious Services: Never   Database administrator or Organizations: No   Attends Engineer, structural: Never   Marital Status: Married    Tobacco Counseling Counseling given: Not Answered   Clinical Intake:  Pre-visit preparation completed: No  Pain : No/denies pain Pain Score: 0-No pain     Nutritional Risks: None Diabetes: No  How often do you need to have someone help you when you read instructions, pamphlets, or other written materials from your doctor or pharmacy?: 1 - Never What is the last  grade level you completed in school?: 8th grade  Diabetic?No  Interpreter Needed?: No      Activities of Daily Living In your present state of health, do you have any difficulty performing the following activities: 06/25/2021 06/23/2021  Hearing? N N  Vision? N N  Difficulty concentrating or making decisions? N N  Walking or climbing stairs? N N  Dressing or bathing? N N  Doing errands, shopping? N N  Preparing Food and eating ? N -  Using the Toilet? N -  In the past six months, have you accidently leaked urine? N -  Do you have problems with loss of bowel control? N -  Managing your Medications? N -  Managing your Finances? N -  Housekeeping or managing your Housekeeping? N -  Some recent data might be hidden    Patient Care Team: Marjie Skiff, NP as PCP - General (Nurse Practitioner)  Indicate any recent Medical Services you may have received from other than [facility] providers in the past year (date may be approximate).     Assessment:   This is a routine wellness examination for [place].  Hearing/Vision screen No results found.  Dietary issues and exercise activities discussed: Current Exercise Habits: Home exercise routine, Type of exercise: walking, Time (Minutes): 20, Frequency (Times/Week): 3, Weekly Exercise (Minutes/Week): 60, Intensity: Mild, Exercise limited by: None identified   Goals Addressed   None    Depression Screen PHQ 2/9 Scores 06/25/2021 06/23/2021 06/23/2021 05/14/2020 05/14/2019 05/08/2018 02/20/2018  PHQ - 2 Score 0 0 0 0 0 0 0  PHQ- 9 Score - 0 - 0 - - -    Fall Risk Fall Risk  06/25/2021 06/23/2021 06/23/2021 05/14/2020 11/21/2019  Falls in the past year? 0 0 0 0 0  Number falls in past yr: 0 0 0 - 0  Injury with Fall? 0 0 0 - 0  Risk for fall due to : No Fall Risks No Fall Risks No Fall Risks Medication side effect -  Follow up Falls evaluation completed Education provided Falls evaluation completed Falls evaluation completed;Education provided;Falls prevention discussed Falls evaluation completed    FALL RISK PREVENTION PERTAINING TO THE HOME:  Any stairs in or around the home? Yes  If so, are there any without handrails? Yes  Home free of loose throw rugs in walkways, pet beds, electrical cords, etc? Yes  Adequate lighting in your home to reduce risk of falls? Yes   ASSISTIVE DEVICES UTILIZED TO PREVENT FALLS:  Life alert? No  Use of a cane, walker or w/c? No  Grab bars in the bathroom? No  Shower chair or bench in shower? No  Elevated toilet seat or a handicapped toilet? No   TIMED UP AND GO:  Was the test performed?  N/A .  Length of time to ambulate 10 feet: N/A sec.     Cognitive Function:     6CIT Screen 06/25/2021 06/23/2021 05/14/2020 05/08/2018 05/04/2017  What Year? 0 points 0 points 0 points 0 points 0 points  What month? 0 points 0 points 0 points 0 points 0 points  What time? 0 points 0 points 0 points 0 points 0 points  Count back from 20 0 points 0 points 0 points 0 points 0 points  Months in reverse 4 points 0 points 0 points 0 points 0 points  Repeat phrase 10 points 0 points 2 points 0 points 0 points  Total Score 14 0 2 0 0    Immunizations Immunization History  Administered Date(s) Administered   Fluad Quad(high Dose 65+) 09/02/2019, 06/21/2020, 06/23/2021   Influenza, High Dose Seasonal PF 08/21/2017, 06/28/2018   PFIZER(Purple Top)SARS-COV-2  Vaccination 12/03/2019, 12/24/2019   Pneumococcal Conjugate-13 10/19/2013   Pneumococcal Polysaccharide-23 10/19/2014   Td 12/15/2009, 12/20/2020   Zoster Recombinat (Shingrix) 01/12/2021, 03/14/2021   Zoster, Live 10/19/2014    TDAP status: Up to date  Flu Vaccine status: Up to date  Pneumococcal vaccine status: Up to date  Covid-19 vaccine status: Information provided on how to obtain vaccines.   Qualifies for Shingles Vaccine? Yes   Zostavax completed Yes   Shingrix Completed?: Yes  Screening Tests Health Maintenance  Topic Date Due   Fecal DNA (Cologuard)  05/12/2021   COVID-19 Vaccine (3 - Booster for Pfizer series) 07/09/2021 (Originally 05/25/2020)   COLON CANCER SCREENING ANNUAL FOBT  02/18/2022   TETANUS/TDAP  12/21/2030   INFLUENZA VACCINE  Completed   Hepatitis C Screening  Completed   Zoster Vaccines- Shingrix  Completed   HPV VACCINES  Aged Out    Health Maintenance  Health Maintenance Due  Topic Date Due   Fecal DNA (Cologuard)  05/12/2021    Colorectal cancer screening: Type of screening: FOBT/FIT. Completed 02/18/21. Repeat every 1 years  Lung Cancer Screening: (Low Dose CT Chest recommended if Age 40-80 years, 30 pack-year currently smoking OR have quit w/in 15years.) does qualify.   Lung Cancer Screening Referral: No  Additional Screening:  Hepatitis C Screening: does qualify; Completed 04/17/17  Vision Screening: Recommended annual ophthalmology exams for early detection of glaucoma and other disorders of the eye. Is the patient up to date with their annual eye exam?  Yes  Who is the provider or what is the name of the office in which the patient attends annual eye exams?  [facility] If pt is not established with a provider, would they like to be referred to a provider to establish care?  N/A .   Dental Screening: Recommended annual dental exams for proper oral hygiene  Community Resource Referral / Chronic Care Management: CRR  required this visit?  No   CCM required this visit?  No      Plan:     I have personally reviewed and noted the following in the patient's chart:   Medical and social history Use of alcohol, tobacco or illicit drugs  Current medications and supplements including opioid prescriptions. Patient is not currently taking opioid prescriptions. Functional ability and status Nutritional status Physical activity Advanced directives List of other physicians Hospitalizations, surgeries, and ER visits in previous 12 months Vitals Screenings to include cognitive, depression, and falls Referrals and appointments  In addition, I have reviewed and discussed with patient certain preventive protocols, quality metrics, and best practice recommendations. A written personalized care plan for preventive services as well as general preventive health recommendations were provided to patient.    Mr. Schwandt , Thank you for taking time to come for your Medicare Wellness Visit. I appreciate your ongoing commitment to your health goals. Please review the following plan we discussed and let me know if I can assist you in the future.   These are the goals we discussed:  Goals      DIET - INCREASE WATER INTAKE     Recommend drinking at least 6-8 glasses of water a day      Patient Stated     05/14/2020, no goals     Quit smoking / using tobacco     Quit smoking /  using tobacco     Smoking cessation discussed        This is a list of the screening recommended for you and due dates:  Health Maintenance  Topic Date Due   Cologuard (Stool DNA test)  05/12/2021   COVID-19 Vaccine (3 - Booster for Pfizer series) 07/09/2021*   Stool Blood Test  02/18/2022   Tetanus Vaccine  12/21/2030   Flu Shot  Completed   Hepatitis C Screening: USPSTF Recommendation to screen - Ages 18-79 yo.  Completed   Zoster (Shingles) Vaccine  Completed   HPV Vaccine  Aged Out  *Topic was postponed. The date shown is not the  original due date.      Pablo Ledger, [place]   06/25/2021   Nurse Notes: Non Face to Face 60 minutes

## 2021-06-28 DIAGNOSIS — Z1211 Encounter for screening for malignant neoplasm of colon: Secondary | ICD-10-CM | POA: Diagnosis not present

## 2021-07-02 LAB — COLOGUARD: Cologuard: NEGATIVE

## 2021-07-02 NOTE — Progress Notes (Signed)
Please let William Murray know his Cologuard returned negative, will repeat in 3 years and at that time should be the last check as screening recommended until age 72 only.  Have a great day!! Keep being amazing!!  Thank you for allowing me to participate in your care.  I appreciate you. Kindest regards, Carolos Fecher

## 2021-07-04 ENCOUNTER — Ambulatory Visit: Payer: Medicare Other | Attending: Nurse Practitioner

## 2021-07-31 ENCOUNTER — Other Ambulatory Visit: Payer: Self-pay | Admitting: Nurse Practitioner

## 2021-08-01 ENCOUNTER — Other Ambulatory Visit: Payer: Self-pay | Admitting: *Deleted

## 2021-08-01 DIAGNOSIS — Z87891 Personal history of nicotine dependence: Secondary | ICD-10-CM

## 2021-08-01 NOTE — Telephone Encounter (Signed)
last RF 06/23/21 #90 4 RF

## 2021-08-05 ENCOUNTER — Other Ambulatory Visit: Payer: Self-pay

## 2021-08-05 ENCOUNTER — Other Ambulatory Visit: Payer: Medicare Other

## 2021-08-05 DIAGNOSIS — R7989 Other specified abnormal findings of blood chemistry: Secondary | ICD-10-CM | POA: Diagnosis not present

## 2021-08-06 LAB — T4, FREE: Free T4: 1.18 ng/dL (ref 0.82–1.77)

## 2021-08-06 LAB — TSH: TSH: 5.33 u[IU]/mL — ABNORMAL HIGH (ref 0.450–4.500)

## 2021-08-07 ENCOUNTER — Encounter: Payer: Self-pay | Admitting: Nurse Practitioner

## 2021-08-07 MED ORDER — LEVOTHYROXINE SODIUM 25 MCG PO TABS: 25.0000 ug | ORAL_TABLET | Freq: Every day | ORAL | 4 refills | Status: DC

## 2021-08-07 NOTE — Progress Notes (Signed)
Good morning, please let William Murray know his labs have returned and thyroid level remains slightly elevated with a normal Free T4, suspect some subclinical hypothyroid.  Since the TSH has crept up a little I would like to start a low dose of thyroid medication called Levothyroxine to help lower this and prevent any symptoms presenting.  I would like him to return for an in office visit with me in 6 weeks for both a visit and labs, please schedule.  We can further discuss hypothyroid then.  Any questions? Keep being awesome!!  Thank you for allowing me to participate in your care.  I appreciate you. Kindest regards, Riva Sesma

## 2021-08-07 NOTE — Addendum Note (Signed)
Addended by: Aura Dials T on: 08/07/2021 08:06 AM   Modules accepted: Orders

## 2021-08-10 ENCOUNTER — Ambulatory Visit
Admission: RE | Admit: 2021-08-10 | Discharge: 2021-08-10 | Disposition: A | Discharge status: Home / Self Care | Payer: Medicare Other | Source: Ambulatory Visit | Attending: Acute Care | Admitting: Acute Care

## 2021-08-10 ENCOUNTER — Other Ambulatory Visit: Payer: Self-pay

## 2021-08-10 DIAGNOSIS — Z87891 Personal history of nicotine dependence: Secondary | ICD-10-CM | POA: Insufficient documentation

## 2021-08-17 ENCOUNTER — Other Ambulatory Visit: Payer: Self-pay | Admitting: Acute Care

## 2021-08-17 DIAGNOSIS — Z87891 Personal history of nicotine dependence: Secondary | ICD-10-CM

## 2021-09-01 ENCOUNTER — Other Ambulatory Visit: Payer: Self-pay | Admitting: Nurse Practitioner

## 2021-09-02 NOTE — Telephone Encounter (Signed)
last RF 06/23/21 #90 4 RF  (too soon)

## 2021-09-21 ENCOUNTER — Other Ambulatory Visit: Payer: Self-pay

## 2021-09-21 ENCOUNTER — Ambulatory Visit (INDEPENDENT_AMBULATORY_CARE_PROVIDER_SITE_OTHER): Payer: Medicare Other | Admitting: Nurse Practitioner

## 2021-09-21 ENCOUNTER — Encounter: Payer: Self-pay | Admitting: Nurse Practitioner

## 2021-09-21 VITALS — BP 125/73 | HR 71 | Temp 98.1°F | Wt 207.4 lb

## 2021-09-21 DIAGNOSIS — E038 Other specified hypothyroidism: Secondary | ICD-10-CM | POA: Diagnosis not present

## 2021-09-21 NOTE — Patient Instructions (Signed)

## 2021-09-21 NOTE — Assessment & Plan Note (Signed)
Chronic, ongoing.  Started on medication 08/05/21.  Continue current medication regimen and adjust as needed.  Recheck TSH, Free T4, and antibody today.

## 2021-09-21 NOTE — Progress Notes (Signed)
BP 125/73    Pulse 71    Temp 98.1 F (36.7 C)    Wt 207 lb 6.4 oz (94.1 kg)    SpO2 97%    BMI 31.54 kg/m    Subjective:    Patient ID: William Murray, male    DOB: [birth year], 70 y.o.   MRN: [id number]  HPI: William Murray is a 70 y.o. male  Chief Complaint  Patient presents with   Hypothyroidism    Patient is here to follow up on his Thyroid. Patient denies having any concerns at today's visit.    HYPOTHYROIDISM Started on Levothyroxine 25 MCG on 08/05/21 due to ongoing elevations in TSH. Thyroid control status:stable Satisfied with current treatment? yes Medication side effects: no Medication compliance: good compliance Etiology of hypothyroidism: unknown Recent dose adjustment:no Fatigue: no Cold intolerance: no Heat intolerance: no Weight gain: no Weight loss: no Constipation: no Diarrhea/loose stools: no Palpitations: no Lower extremity edema: no Anxiety/depressed mood: no   Relevant past medical, surgical, family and social history reviewed and updated as indicated. Interim medical history since our last visit reviewed. Allergies and medications reviewed and updated.  Review of Systems  Constitutional:  Negative for activity change, diaphoresis, fatigue and fever.  Respiratory:  Negative for cough, chest tightness, shortness of breath and wheezing.   Cardiovascular:  Negative for chest pain, palpitations and leg swelling.  Gastrointestinal: Negative.   Musculoskeletal:  Negative for arthralgias and joint swelling.  Neurological: Negative.   Psychiatric/Behavioral: Negative.     Per HPI unless specifically indicated above     Objective:    BP 125/73    Pulse 71    Temp 98.1 F (36.7 C)    Wt 207 lb 6.4 oz (94.1 kg)    SpO2 97%    BMI 31.54 kg/m   Wt Readings from Last 3 Encounters:  09/21/21 207 lb 6.4 oz (94.1 kg)  08/10/21 201 lb (91.2 kg)  06/23/21 201 lb 6.4 oz (91.4 kg)    Physical Exam Vitals and nursing note reviewed.  Constitutional:       General: He is awake. He is not in acute distress.    Appearance: He is well-developed. He is obese. He is not ill-appearing.  HENT:     Head: Normocephalic and atraumatic.     Right Ear: Hearing normal. No drainage.     Left Ear: Hearing normal. No drainage.  Eyes:     General: Lids are normal.        Right eye: No discharge.        Left eye: No discharge.     Conjunctiva/sclera: Conjunctivae normal.     Pupils: Pupils are equal, round, and reactive to light.  Neck:     Vascular: No carotid bruit.  Cardiovascular:     Rate and Rhythm: Normal rate and regular rhythm.     Heart sounds: Normal heart sounds, S1 normal and S2 normal. No murmur heard.   No gallop.  Pulmonary:     Effort: Pulmonary effort is normal. No accessory muscle usage or respiratory distress.     Breath sounds: Normal breath sounds.  Abdominal:     General: Bowel sounds are normal.     Palpations: Abdomen is soft.  Musculoskeletal:        General: Normal range of motion.     Right hand: Normal.     Left hand: Normal.     Cervical back: Normal range of motion and neck supple.  Right lower leg: Edema (trace at sock line) present.     Left lower leg: Edema (trace at sock line) present.  Skin:    General: Skin is warm and dry.  Neurological:     Mental Status: He is alert and oriented to person, place, and time.  Psychiatric:        Mood and Affect: Mood normal.        Behavior: Behavior normal. Behavior is cooperative.        Thought Content: Thought content normal.        Judgment: Judgment normal.    Results for orders placed or performed in visit on 08/05/21  TSH  Result Value Ref Range   TSH 5.330 (H) 0.450 - 4.500 uIU/mL  T4, free  Result Value Ref Range   Free T4 1.18 0.82 - 1.77 ng/dL      Assessment & Plan:   Problem List Items Addressed This Visit       Endocrine   Subclinical hypothyroidism - Primary    Chronic, ongoing.  Started on medication 08/05/21.  Continue current  medication regimen and adjust as needed.  Recheck TSH, Free T4, and antibody today.      Relevant Orders   Thyroid peroxidase antibody   T4, free   TSH     Follow up plan: Return in about 6 months (around 03/22/2022) for HTN/HLD, THYROID, RLS.

## 2021-09-22 ENCOUNTER — Other Ambulatory Visit: Payer: Self-pay | Admitting: Nurse Practitioner

## 2021-09-22 DIAGNOSIS — E038 Other specified hypothyroidism: Secondary | ICD-10-CM

## 2021-09-22 LAB — T4, FREE: Free T4: 1.18 ng/dL (ref 0.82–1.77)

## 2021-09-22 LAB — TSH: TSH: 4.74 u[IU]/mL — ABNORMAL HIGH (ref 0.450–4.500)

## 2021-09-22 LAB — THYROID PEROXIDASE ANTIBODY: Thyroperoxidase Ab SerPl-aCnc: <9 IU/mL (ref 0–34)

## 2021-09-22 MED ORDER — LEVOTHYROXINE SODIUM 50 MCG PO TABS: 50.0000 ug | ORAL_TABLET | Freq: Every day | ORAL | 4 refills | Status: DC

## 2021-09-22 NOTE — Progress Notes (Signed)
Good morning, please let Austan know his thyroid labs have returned, his TSH remains a little elevated and I would like to increase his Levothyroxine to 50 MCG daily.  I recommend he start taking 2 of his 25 MCG pills and then start the new 50 MCG pill I sent in once he has completed those.  I would like him to return in 6 weeks for lab visit only, please schedule, to recheck his thyroid labs.  Any questions? Keep being amazing!!  Thank you for allowing me to participate in your care.  I appreciate you. Kindest regards, Navraj Dreibelbis

## 2021-11-03 ENCOUNTER — Other Ambulatory Visit: Payer: Medicare Other

## 2021-11-03 ENCOUNTER — Other Ambulatory Visit: Payer: Self-pay

## 2021-11-03 DIAGNOSIS — E038 Other specified hypothyroidism: Secondary | ICD-10-CM

## 2021-11-04 LAB — T4, FREE: Free T4: 1.36 ng/dL (ref 0.82–1.77)

## 2021-11-04 LAB — TSH: TSH: 2.08 u[IU]/mL (ref 0.450–4.500)

## 2021-11-04 NOTE — Progress Notes (Signed)
Good morning, please let William Murray know his thyroid labs are now within normal ranges, continue current Levothyroxine dosing.  Great news!!  Also tell him I am wearing the purple and white scarf his wife made me today, if has become my new favorite thing and I wear it everywhere with pride.  I love it!!

## 2021-12-18 NOTE — Patient Instructions (Incomplete)

## 2021-12-21 ENCOUNTER — Encounter: Payer: Self-pay | Admitting: Nurse Practitioner

## 2021-12-21 ENCOUNTER — Ambulatory Visit (INDEPENDENT_AMBULATORY_CARE_PROVIDER_SITE_OTHER): Payer: Medicare Other | Admitting: Nurse Practitioner

## 2021-12-21 ENCOUNTER — Other Ambulatory Visit: Payer: Self-pay

## 2021-12-21 VITALS — BP 124/66 | HR 71 | Temp 98.2°F | Ht 68.0 in | Wt 204.4 lb

## 2021-12-21 DIAGNOSIS — I700 Atherosclerosis of aorta: Secondary | ICD-10-CM | POA: Diagnosis not present

## 2021-12-21 DIAGNOSIS — J432 Centrilobular emphysema: Secondary | ICD-10-CM

## 2021-12-21 DIAGNOSIS — M19041 Primary osteoarthritis, right hand: Secondary | ICD-10-CM

## 2021-12-21 DIAGNOSIS — M722 Plantar fascial fibromatosis: Secondary | ICD-10-CM | POA: Diagnosis not present

## 2021-12-21 DIAGNOSIS — K760 Fatty (change of) liver, not elsewhere classified: Secondary | ICD-10-CM

## 2021-12-21 DIAGNOSIS — E038 Other specified hypothyroidism: Secondary | ICD-10-CM

## 2021-12-21 DIAGNOSIS — M1A0720 Idiopathic chronic gout, left ankle and foot, without tophus (tophi): Secondary | ICD-10-CM

## 2021-12-21 DIAGNOSIS — M19042 Primary osteoarthritis, left hand: Secondary | ICD-10-CM

## 2021-12-21 DIAGNOSIS — N400 Enlarged prostate without lower urinary tract symptoms: Secondary | ICD-10-CM

## 2021-12-21 DIAGNOSIS — M1A9XX0 Chronic gout, unspecified, without tophus (tophi): Secondary | ICD-10-CM | POA: Diagnosis not present

## 2021-12-21 DIAGNOSIS — E7800 Pure hypercholesterolemia, unspecified: Secondary | ICD-10-CM | POA: Diagnosis not present

## 2021-12-21 DIAGNOSIS — I10 Essential (primary) hypertension: Secondary | ICD-10-CM | POA: Diagnosis not present

## 2021-12-21 LAB — MICROALBUMIN, URINE WAIVED
Creatinine, Urine Waived: 200 mg/dL (ref 10–300)
Microalb, Ur Waived: 10 mg/L (ref 0–19)
Microalb/Creat Ratio: <30 mg/g (ref <30)

## 2021-12-21 NOTE — Assessment & Plan Note (Signed)
Chronic, ongoing.  Started on medication 08/05/21.  Continue current medication regimen and adjust as needed.  Recheck TSH, Free T4 today. ?

## 2021-12-21 NOTE — Assessment & Plan Note (Signed)
Chronic, ongoing.  Continue current medication regimen and adjust as needed.  Lipid panel today, fasting.  Refills sent in. 

## 2021-12-21 NOTE — Assessment & Plan Note (Signed)
Chronic, stable with AUA = 1.  Continue current medication regimen and adjust as needed.  Refills today.  ?

## 2021-12-21 NOTE — Assessment & Plan Note (Signed)
To right heel, recommend Tylenol as needed.  Use water bottle and place in freezer to allow to freeze, then roll heel on bottle for comfort.  Ensure stretching daily.  May use Icy/Hot or Voltaren gel to heel.  Return for worsening or ongoing. ?

## 2021-12-21 NOTE — Assessment & Plan Note (Signed)
Chronic, stable with minimal Albuterol use.  Spirometry in 2022 =  mild disease FEV1 91% and FEV1/FVC 108%.  Continue current regimen and adjust as needed.  Continue annual CT lung screens until age 71.  Recommend continue cessation of smoking.  Return in 6 months. ?

## 2021-12-21 NOTE — Progress Notes (Signed)
? ?BP 124/66   Pulse 71   Temp 98.2 ?F (36.8 ?C) (Oral)   Ht 5\' 8"  (1.727 m)   Wt 204 lb 6.4 oz (92.7 kg)   SpO2 97%   BMI 31.08 kg/m?   ? ?Subjective:  ? ? Patient ID: William Murray, male    DOB: September 22, 1951, 71 y.o.   MRN: [id number] ? ?HPI: ?William Murray is a 71 y.o. male ? ?Chief Complaint  ?Patient presents with  ? Hyperlipidemia  ? Hypertension  ? COPD  ? Benign Prostatic Hypertrophy  ? Hand Pain  ?  Patient states he is having pain in the joints of both of his hands. Patient states it will get to the point where it is hard for him to pick up things and he says he has notices issues before, but thinks it is getting worse this year.   ? Leg Problem  ?  Patient states he is noticing some pain in his right leg and he notices pain on his heel. Patient states it is starting to bother him when he is walking.   ? ?HYPERTENSION ?Continues on Losartan 100 MG, Metoprolol XL 25 MG daily, Amlodipine 5 MG daily, and Atorvastatin 10 MG daily. In past reports taking one BP medication that caused cough, suspect an ACE. ? ?Saw Dr. 61 last 07/20/20, had Zio due to PVCs and was started on Metoprolol during that time, is to return as needed. ?Hypertension status: stable  ?Satisfied with current treatment? yes ?Duration of hypertension: chronic ?BP monitoring frequency:  rarely ?BP range: 130/70 range ?BP medication side effects:  no ?Medication compliance: good compliance ?Previous BP meds: Losartan and HCTZ ?Aspirin: yes ?Recurrent headaches: no ?Visual changes: no ?Palpitations: no ?Dyspnea: no ?Chest pain: no ?Lower extremity edema: occasional at baseline ?Dizzy/lightheaded: no ?The ASCVD Risk score (Arnett DK, et al., 2019) failed to calculate for the following reasons: ?  The valid total cholesterol range is 130 to 320 mg/dL  ? ?COPD ?Recent lung screening was done November 2022-- noting mild centrilobular emphysema and aortic atherosclerosis.  There is also noted to be a right kidney cyst which has remained  stable in size, recently 3.9 cm -- smaller then previous.  FEV1 91% and FEV1/FVC 108% 12/20/20 last. ? ?Currently only use Albuterol as needed, has not had to use this year.  Has history of smoking, quit several years ago -- smoked about 20 years heavily.   ?COPD status: controlled ?Satisfied with current treatment?: yes ?Oxygen use: no ?Dyspnea frequency:  none ?Cough frequency: none ?Rescue inhaler frequency:  none ?Limitation of activity: no ?Productive cough:  none ?Last Spirometry: 12/20/2020  ?Pneumovax: Up to Date ?Influenza: Up to Date  ? ?HYPOTHYROIDISM ?Continues on Levothyroxine 50 MCG daily, last TSH stable in January. ?Thyroid control status:stable ?Satisfied with current treatment? yes ?Medication side effects: no ?Medication compliance: good compliance ?Etiology of hypothyroidism: unknown ?Recent dose adjustment:no ?Fatigue: no ?Cold intolerance: no ?Heat intolerance: no ?Weight gain: no ?Weight loss: no ?Constipation: no ?Diarrhea/loose stools: no ?Palpitations: no ?Lower extremity edema: no ?Anxiety/depressed mood: no  ? ?BPH ?Continues on Flomax 0.4 MG daily.   ?BPH status: stable ?Satisfied with current treatment?: yes ?Medication side effects: no ?Medication compliance: good compliance ?Duration: chronic ?Nocturia: 1/night ?Urinary frequency:no ?Incomplete voiding: no ?Urgency: no ?Weak urinary stream: no ?Straining to start stream: no ?Dysuria: no ?Onset: gradual ?Severity: mild ?Alleviating factors: Flomax ?Aggravating factors: unknown ?Treatments attempted: Flomax ?IPSS Questionnaire (AUA-7): ?Over the past month?   ?1)  How  often have you had a sensation of not emptying your bladder completely after you finish urinating?  0 - Not at all  ?2)  How often have you had to urinate again less than two hours after you finished urinating? 0 - Not at all  ?3)  How often have you found you stopped and started again several times when you urinated?  0 - Not at all  ?4) How difficult have you found it to  postpone urination?  0 - Not at all  ?5) How often have you had a weak urinary stream?  0 - Not at all  ?6) How often have you had to push or strain to begin urination?  0 - Not at all  ?7) How many times did you most typically get up to urinate from the time you went to bed until the time you got up in the morning?  1 - 1 time  ?Total score:  0-7 mildly symptomatic  ? 8-19 moderately symptomatic  ? 20-35 severely symptomatic  ? ?ARTHRALGIAS / JOINT ACHES ?Taking Allopurinol 100 MG daily for history of uric acid elevation with joint pain -- this has offered some benefit.  Currently both of his hands are aching -- feels it is getting worse this year.  Will be difficult to pick things up.  Also noticing pain to right heel, difficult with walking.  When gets out of bed, heel will be painful -- then feels better once moving. ?Duration: months ?Pain: yes ?Symmetric: yes ? 6/10 ?Quality: dull, aching, and throbbing ?Frequency: intermittent ?Context:  worse ?Decreased function/range of motion: yes to hands ?Erythema: none ?Swelling: none ?Heat or warmth: none ?Morning stiffness: a little bit, but goes away after less then 30 minutes ?Aggravating factors: movement ?Alleviating factors: Allopurinol ?Relief with NSAIDs?: No NSAIDs Taken ?Treatments attempted:  Allopurinol  ?Involved Joints:  ?   Hands: yes bilateral ?   Wrists: none ?   Elbows: none ?   Shoulders: none ?   Back: none ?   Hips: none ?   Knees: none ?   Ankles: none ?   Feet:none ? ? ?Relevant past medical, surgical, family and social history reviewed and updated as indicated. Interim medical history since our last visit reviewed. ?Allergies and medications reviewed and updated. ? ?Review of Systems  ?Constitutional:  Negative for activity change, diaphoresis, fatigue and fever.  ?Respiratory:  Negative for cough, chest tightness, shortness of breath and wheezing.   ?Cardiovascular:  Negative for chest pain, palpitations and leg swelling.  ?Gastrointestinal:  Negative.   ?Musculoskeletal:  Negative for arthralgias and joint swelling.  ?Neurological: Negative.   ?Psychiatric/Behavioral: Negative.    ? ?Per HPI unless specifically indicated above ? ?   ?Objective:  ?  ?BP 124/66   Pulse 71   Temp 98.2 ?F (36.8 ?C) (Oral)   Ht 5\' 8"  (1.727 m)   Wt 204 lb 6.4 oz (92.7 kg)   SpO2 97%   BMI 31.08 kg/m?   ?Wt Readings from Last 3 Encounters:  ?12/21/21 204 lb 6.4 oz (92.7 kg)  ?09/21/21 207 lb 6.4 oz (94.1 kg)  ?08/10/21 201 lb (91.2 kg)  ?  ?Physical Exam ?Vitals and nursing note reviewed.  ?Constitutional:   ?   General: He is awake. He is not in acute distress. ?   Appearance: He is well-developed. He is obese. He is not ill-appearing.  ?HENT:  ?   Head: Normocephalic and atraumatic.  ?   Right Ear: Hearing normal. No  drainage.  ?   Left Ear: Hearing normal. No drainage.  ?Eyes:  ?   General: Lids are normal.     ?   Right eye: No discharge.     ?   Left eye: No discharge.  ?   Conjunctiva/sclera: Conjunctivae normal.  ?   Pupils: Pupils are equal, round, and reactive to light.  ?Neck:  ?   Vascular: No carotid bruit.  ?Cardiovascular:  ?   Rate and Rhythm: Normal rate and regular rhythm.  ?   Pulses:     ?     Dorsalis pedis pulses are 2+ on the right side and 2+ on the left side.  ?     Posterior tibial pulses are 2+ on the right side and 2+ on the left side.  ?   Heart sounds: Normal heart sounds, S1 normal and S2 normal. No murmur heard. ?  No gallop.  ?Pulmonary:  ?   Effort: Pulmonary effort is normal. No accessory muscle usage or respiratory distress.  ?   Breath sounds: Normal breath sounds.  ?Abdominal:  ?   General: Bowel sounds are normal.  ?   Palpations: Abdomen is soft.  ?Musculoskeletal:     ?   General: Normal range of motion.  ?   Right hand: Normal.  ?   Left hand: Normal.  ?   Cervical back: Normal range of motion and neck supple.  ?   Right lower leg: Edema (trace at sock line) present.  ?   Left lower leg: Edema (trace at sock line) present.  ?    Right foot: Normal range of motion. Tenderness (right heel) present. No deformity, bony tenderness or crepitus.  ?   Left foot: Normal. Normal range of motion.  ?Feet:  ?   Right foot:  ?   Protective Sensation: 10 site

## 2021-12-21 NOTE — Assessment & Plan Note (Signed)
Ongoing issue with underlying gout.  Will recheck uric acid level today.  Obtain imaging of hands at [place] location.  Recommend Tylenol 1000 MG daily for pain in morning, may take max 3000 MG daily.  Ensure stretching daily and may use OTC creams, like Voltaren, to knuckles.   ?

## 2021-12-21 NOTE — Assessment & Plan Note (Signed)
Chronic, stable with addition of Allopurinol 100 MG daily, can adjust doses as needed.  Recheck uric acid level today and treat flares as needed.  Recommend monitoring diet and continue to cut back on alcohol use. ?

## 2021-12-21 NOTE — Assessment & Plan Note (Signed)
Discussed focus on healthy diet and modest weight loss + cut back on alcohol intake.  Check CMP today. 

## 2021-12-21 NOTE — Assessment & Plan Note (Signed)
Continue Lipitor and ASA daily for prevention.  Continue complete cessation of smoking.  Continue yearly lung CT scans. ?

## 2021-12-21 NOTE — Assessment & Plan Note (Signed)
Chronic, ongoing with BP at goal today. Recommend he monitor BP at least a few mornings a week at home and document.  DASH diet at home.  Continue current medication regimen and adjust as needed.  Labs today: CMP and lipid.  Refills sent in.  Return in 6 months. ? ?

## 2021-12-22 ENCOUNTER — Ambulatory Visit
Admission: RE | Admit: 2021-12-22 | Discharge: 2021-12-22 | Disposition: A | Discharge status: Home / Self Care | Payer: Medicare Other | Attending: Nurse Practitioner | Admitting: Nurse Practitioner

## 2021-12-22 ENCOUNTER — Ambulatory Visit
Admission: RE | Admit: 2021-12-22 | Discharge: 2021-12-22 | Disposition: A | Discharge status: Home / Self Care | Payer: Medicare Other | Source: Ambulatory Visit | Attending: Nurse Practitioner | Admitting: Nurse Practitioner

## 2021-12-22 DIAGNOSIS — M19041 Primary osteoarthritis, right hand: Secondary | ICD-10-CM | POA: Insufficient documentation

## 2021-12-22 DIAGNOSIS — M79642 Pain in left hand: Secondary | ICD-10-CM | POA: Diagnosis not present

## 2021-12-22 DIAGNOSIS — M79641 Pain in right hand: Secondary | ICD-10-CM | POA: Diagnosis not present

## 2021-12-22 DIAGNOSIS — M19042 Primary osteoarthritis, left hand: Secondary | ICD-10-CM | POA: Diagnosis not present

## 2021-12-22 LAB — LIPID PANEL W/O CHOL/HDL RATIO
Cholesterol, Total: 116 mg/dL (ref 100–199)
HDL: 39 mg/dL — ABNORMAL LOW (ref >39)
LDL Chol Calc (NIH): 48 mg/dL (ref 0–99)
Triglycerides: 171 mg/dL — ABNORMAL HIGH (ref 0–149)
VLDL Cholesterol Cal: 29 mg/dL (ref 5–40)

## 2021-12-22 LAB — COMPREHENSIVE METABOLIC PANEL
ALT: 18 IU/L (ref 0–44)
AST: 28 IU/L (ref 0–40)
Albumin/Globulin Ratio: 1.6 (ref 1.2–2.2)
Albumin: 4.4 g/dL (ref 3.7–4.7)
Alkaline Phosphatase: 91 IU/L (ref 44–121)
BUN/Creatinine Ratio: 14 (ref 10–24)
BUN: 14 mg/dL (ref 8–27)
Bilirubin Total: 0.6 mg/dL (ref 0.0–1.2)
CO2: 21 mmol/L (ref 20–29)
Calcium: 9.2 mg/dL (ref 8.6–10.2)
Chloride: 104 mmol/L (ref 96–106)
Creatinine, Ser: 0.99 mg/dL (ref 0.76–1.27)
Globulin, Total: 2.8 g/dL (ref 1.5–4.5)
Glucose: 108 mg/dL — ABNORMAL HIGH (ref 70–99)
Potassium: 4.2 mmol/L (ref 3.5–5.2)
Sodium: 142 mmol/L (ref 134–144)
Total Protein: 7.2 g/dL (ref 6.0–8.5)
eGFR: 81 mL/min/{1.73_m2} (ref >59)

## 2021-12-22 LAB — URIC ACID: Uric Acid: 5.2 mg/dL (ref 3.8–8.4)

## 2021-12-22 LAB — T4, FREE: Free T4: 1.43 ng/dL (ref 0.82–1.77)

## 2021-12-22 LAB — TSH: TSH: 2.46 u[IU]/mL (ref 0.450–4.500)

## 2021-12-22 NOTE — Progress Notes (Signed)
Good morning, please let Brooks know labs have returned and are overall nice and stable.  No changes needed to medications.  Any questions? ?Keep being amazing!!  Thank you for allowing me to participate in your care.  I appreciate you. ?Kindest regards, ?Anastasia Tompson ?

## 2021-12-24 NOTE — Progress Notes (Signed)
Please let Eliberto know his hand imaging has returned and both hands do show some mild arthritic changes with some loss of cushion.  Would continue Tylenol as needed.  If any questions please let me know. ?Keep being awesome!!  Thank you for allowing me to participate in your care.  I appreciate you. ?Kindest regards, ?Raylan Hanton ?

## 2022-02-14 ENCOUNTER — Other Ambulatory Visit: Payer: Self-pay

## 2022-03-13 ENCOUNTER — Telehealth: Payer: Self-pay | Admitting: Cardiovascular Disease

## 2022-03-13 NOTE — Telephone Encounter (Signed)
Patient declined recall .

## 2022-03-19 NOTE — Patient Instructions (Incomplete)

## 2022-03-22 ENCOUNTER — Ambulatory Visit: Payer: Medicare Other | Admitting: Nurse Practitioner

## 2022-05-19 ENCOUNTER — Ambulatory Visit: Payer: Medicare Other

## 2022-06-24 NOTE — Patient Instructions (Signed)

## 2022-06-27 ENCOUNTER — Ambulatory Visit (INDEPENDENT_AMBULATORY_CARE_PROVIDER_SITE_OTHER): Payer: Medicare Other

## 2022-06-27 ENCOUNTER — Ambulatory Visit (INDEPENDENT_AMBULATORY_CARE_PROVIDER_SITE_OTHER): Payer: Medicare Other | Admitting: Nurse Practitioner

## 2022-06-27 ENCOUNTER — Encounter: Payer: Self-pay | Admitting: Nurse Practitioner

## 2022-06-27 VITALS — BP 147/64 | HR 57 | Temp 98.4°F | Ht 68.5 in | Wt 196.6 lb

## 2022-06-27 VITALS — BP 134/70 | HR 66 | Temp 98.4°F | Ht 68.5 in | Wt 196.6 lb

## 2022-06-27 DIAGNOSIS — N281 Cyst of kidney, acquired: Secondary | ICD-10-CM

## 2022-06-27 DIAGNOSIS — E038 Other specified hypothyroidism: Secondary | ICD-10-CM | POA: Diagnosis not present

## 2022-06-27 DIAGNOSIS — M1A0720 Idiopathic chronic gout, left ankle and foot, without tophus (tophi): Secondary | ICD-10-CM | POA: Diagnosis not present

## 2022-06-27 DIAGNOSIS — E7800 Pure hypercholesterolemia, unspecified: Secondary | ICD-10-CM

## 2022-06-27 DIAGNOSIS — J432 Centrilobular emphysema: Secondary | ICD-10-CM

## 2022-06-27 DIAGNOSIS — Z0000 Encounter for general adult medical examination without abnormal findings: Secondary | ICD-10-CM | POA: Diagnosis not present

## 2022-06-27 DIAGNOSIS — K760 Fatty (change of) liver, not elsewhere classified: Secondary | ICD-10-CM

## 2022-06-27 DIAGNOSIS — I700 Atherosclerosis of aorta: Secondary | ICD-10-CM

## 2022-06-27 DIAGNOSIS — I10 Essential (primary) hypertension: Secondary | ICD-10-CM | POA: Diagnosis not present

## 2022-06-27 DIAGNOSIS — Z87891 Personal history of nicotine dependence: Secondary | ICD-10-CM

## 2022-06-27 DIAGNOSIS — N400 Enlarged prostate without lower urinary tract symptoms: Secondary | ICD-10-CM

## 2022-06-27 DIAGNOSIS — Z136 Encounter for screening for cardiovascular disorders: Secondary | ICD-10-CM

## 2022-06-27 DIAGNOSIS — G2581 Restless legs syndrome: Secondary | ICD-10-CM

## 2022-06-27 MED ORDER — LOSARTAN POTASSIUM 100 MG PO TABS: 100.0000 mg | ORAL_TABLET | Freq: Every day | ORAL | 4 refills | Status: DC

## 2022-06-27 MED ORDER — ATORVASTATIN CALCIUM 10 MG PO TABS
10.0000 mg | ORAL_TABLET | Freq: Every day | ORAL | 4 refills | Status: DC
Start: 2022-06-27 — End: 2023-06-29

## 2022-06-27 MED ORDER — LEVOTHYROXINE SODIUM 50 MCG PO TABS: 50.0000 ug | ORAL_TABLET | Freq: Every day | ORAL | 4 refills | Status: DC

## 2022-06-27 MED ORDER — TAMSULOSIN HCL 0.4 MG PO CAPS: 0.4000 mg | ORAL_CAPSULE | Freq: Every day | ORAL | 4 refills | Status: DC

## 2022-06-27 MED ORDER — AMLODIPINE BESYLATE 5 MG PO TABS: 5.0000 mg | ORAL_TABLET | Freq: Every day | ORAL | 4 refills | Status: DC

## 2022-06-27 MED ORDER — ALLOPURINOL 100 MG PO TABS: 100.0000 mg | ORAL_TABLET | Freq: Every day | ORAL | 4 refills | Status: DC

## 2022-06-27 MED ORDER — METOPROLOL SUCCINATE ER 25 MG PO TB24: 25.0000 mg | ORAL_TABLET | Freq: Every day | ORAL | 4 refills | Status: DC

## 2022-06-27 NOTE — Assessment & Plan Note (Signed)
Stable with treatment of thyroid and continued magnesium supplement, continue current treatments and adjust as needed.

## 2022-06-27 NOTE — Assessment & Plan Note (Signed)
Noted on CT lung screening and remains stable in size.  Monitor closely and refer to nephrology as needed.

## 2022-06-27 NOTE — Assessment & Plan Note (Signed)
Continue cessation of smoking and yearly CT scan until age 71 + obtain AAA screening, discussed with patient.

## 2022-06-27 NOTE — Assessment & Plan Note (Addendum)
Ongoing on imaging.  Discussed focus on healthy diet and modest weight loss + cut back on alcohol intake.  Check CMP today.

## 2022-06-27 NOTE — Assessment & Plan Note (Signed)
Chronic, ongoing.  Continue current medication regimen and adjust as needed.  Lipid panel today, fasting.  Refills sent in.

## 2022-06-27 NOTE — Patient Instructions (Signed)
Health Maintenance, Male Adopting a healthy lifestyle and getting preventive care are important in promoting health and wellness. Ask your health care provider about: The right schedule for you to have regular tests and exams. Things you can do on your own to prevent diseases and keep yourself healthy. What should I know about diet, weight, and exercise? Eat a healthy diet  Eat a diet that includes plenty of vegetables, fruits, low-fat dairy products, and lean protein. Do not eat a lot of foods that are high in solid fats, added sugars, or sodium. Maintain a healthy weight Body mass index (BMI) is a measurement that can be used to identify possible weight problems. It estimates body fat based on height and weight. Your health care provider can help determine your BMI and help you achieve or maintain a healthy weight. Get regular exercise Get regular exercise. This is one of the most important things you can do for your health. Most adults should: Exercise for at least 150 minutes each week. The exercise should increase your heart rate and make you sweat (moderate-intensity exercise). Do strengthening exercises at least twice a week. This is in addition to the moderate-intensity exercise. Spend less time sitting. Even light physical activity can be beneficial. Watch cholesterol and blood lipids Have your blood tested for lipids and cholesterol at 73 years of age, then have this test every 5 years. You may need to have your cholesterol levels checked more often if: Your lipid or cholesterol levels are high. You are older than 73 years of age. You are at high risk for heart disease. What should I know about cancer screening? Many types of cancers can be detected early and may often be prevented. Depending on your health history and family history, you may need to have cancer screening at various ages. This may include screening for: Colorectal cancer. Prostate cancer. Skin cancer. Lung  cancer. What should I know about heart disease, diabetes, and high blood pressure? Blood pressure and heart disease High blood pressure causes heart disease and increases the risk of stroke. This is more likely to develop in people who have high blood pressure readings or are overweight. Talk with your health care provider about your target blood pressure readings. Have your blood pressure checked: Every 3-5 years if you are 18-39 years of age. Every year if you are 40 years old or older. If you are between the ages of 65 and 75 and are a current or former smoker, ask your health care provider if you should have a one-time screening for abdominal aortic aneurysm (AAA). Diabetes Have regular diabetes screenings. This checks your fasting blood sugar level. Have the screening done: Once every three years after age 45 if you are at a normal weight and have a low risk for diabetes. More often and at a younger age if you are overweight or have a high risk for diabetes. What should I know about preventing infection? Hepatitis B If you have a higher risk for hepatitis B, you should be screened for this virus. Talk with your health care provider to find out if you are at risk for hepatitis B infection. Hepatitis C Blood testing is recommended for: Everyone born from 1945 through 1965. Anyone with known risk factors for hepatitis C. Sexually transmitted infections (STIs) You should be screened each year for STIs, including gonorrhea and chlamydia, if: You are sexually active and are younger than 73 years of age. You are older than 73 years of age and your   health care provider tells you that you are at risk for this type of infection. Your sexual activity has changed since you were last screened, and you are at increased risk for chlamydia or gonorrhea. Ask your health care provider if you are at risk. Ask your health care provider about whether you are at high risk for HIV. Your health care provider  may recommend a prescription medicine to help prevent HIV infection. If you choose to take medicine to prevent HIV, you should first get tested for HIV. You should then be tested every 3 months for as long as you are taking the medicine. Follow these instructions at home: Alcohol use Do not drink alcohol if your health care provider tells you not to drink. If you drink alcohol: Limit how much you have to 0-2 drinks a day. Know how much alcohol is in your drink. In the [place] one drink equals one 12 oz bottle of beer (355 mL), one 5 oz glass of wine (148 mL), or one 1 oz glass of hard liquor (44 mL). Lifestyle Do not use any products that contain nicotine or tobacco. These products include cigarettes, chewing tobacco, and vaping devices, such as e-cigarettes. If you need help quitting, ask your health care provider. Do not use street drugs. Do not share needles. Ask your health care provider for help if you need support or information about quitting drugs. General instructions Schedule regular health, dental, and eye exams. Stay current with your vaccines. Tell your health care provider if: You often feel depressed. You have ever been abused or do not feel safe at home. Summary Adopting a healthy lifestyle and getting preventive care are important in promoting health and wellness. Follow your health care provider's instructions about healthy diet, exercising, and getting tested or screened for diseases. Follow your health care provider's instructions on monitoring your cholesterol and blood pressure. This information is not intended to replace advice given to you by your health care provider. Make sure you discuss any questions you have with your health care provider. Document Revised: 02/14/2021 Document Reviewed: 02/14/2021 Elsevier Patient Education  2023 [employer]  

## 2022-06-27 NOTE — Assessment & Plan Note (Signed)
Ongoing on imaging.  Continue Lipitor and ASA daily for prevention.  Continue complete cessation of smoking.  Continue yearly lung CT scans.

## 2022-06-27 NOTE — Progress Notes (Signed)
BP 134/70 (BP Location: Left Arm, Patient Position: Sitting, Cuff Size: Normal)   Pulse 66   Temp 98.4 F (36.9 C) (Oral)   Ht 5' 8.5" (1.74 m)   Wt 196 lb 9.6 oz (89.2 kg)   SpO2 98%   BMI 29.46 kg/m    Subjective:    Patient ID: William Murray, male    DOB: 1950-10-28, 71 y.o.   MRN: [id number]  HPI: William Murray is a 70 y.o. male presenting on 06/27/2022 for comprehensive medical examination. Current medical complaints include:none  He currently lives with: wife Interim Problems from his last visit: no  HYPERTENSION Continues on Losartan 100 MG, Metoprolol XL 25 MG daily, Amlodipine 5 MG daily, and Atorvastatin 10 MG daily. Last saw cardiology in October 2021, is to return as needed.  Taking Allopurinol 100 MG daily for history of uric acid elevation with joint pain -- this has offered some benefit.   Hypertension status: stable  Satisfied with current treatment? yes Duration of hypertension: chronic BP monitoring frequency:  rarely BP range: 130/70 range on average BP medication side effects:  no Medication compliance: good compliance Previous BP meds: Losartan and HCTZ Aspirin: yes Recurrent headaches: no Visual changes: no Palpitations: no Dyspnea: no Chest pain: no Lower extremity edema: occasional at baseline, no worsening Dizzy/lightheaded: if stands up too quickly  COPD Last lung screening was done November 2022 -- continues to note mild centrilobular emphysema and aortic atherosclerosis.    There is also noted to be a right kidney cyst which has remained stable in size, recently 3.9 cm.   Currently only use Albuterol as needed, does not have to use it.  Has history of smoking, quit several years ago -- smoked about 20 years heavily.   COPD status: controlled Satisfied with current treatment?: yes Oxygen use: no Dyspnea frequency:  none Cough frequency: none Rescue inhaler frequency:  none Limitation of activity: no Productive cough:  none Last  Spirometry: 12/20/2020  Pneumovax: Up to Date Influenza: Up to Date   HYPOTHYROIDISM Continues on Levothyroxine 50 MCG daily.  Continues on Mag supplement due to history of low levels and to treat RLS. Thyroid control status:stable Satisfied with current treatment? yes Medication side effects: no Medication compliance: good compliance Etiology of hypothyroidism: unknown Recent dose adjustment:no Fatigue: no Cold intolerance: no Heat intolerance: no Weight gain: no Weight loss: no Constipation: no Diarrhea/loose stools: no Palpitations: no Lower extremity edema: no Anxiety/depressed mood: no    BPH Continues on Flomax 0.4 MG daily.   BPH status: stable Satisfied with current treatment?: yes Medication side effects: no Medication compliance: good compliance Duration: chronic Nocturia: no Urinary frequency:no Incomplete voiding: no Urgency: no Weak urinary stream: no Straining to start stream: no Dysuria: no Onset: gradual Severity: mild Alleviating factors: Flomax Aggravating factors: unknown Treatments attempted: Flomax IPSS Questionnaire (AUA-7): Over the past month.   1)  How often have you had a sensation of not emptying your bladder completely after you finish urinating?  0 - Not at all  2)  How often have you had to urinate again less than two hours after you finished urinating? 0 - Not at all  3)  How often have you found you stopped and started again several times when you urinated?  0 - Not at all  4) How difficult have you found it to postpone urination?  0 - Not at all  5) How often have you had a weak urinary stream?  0 - Not  at all  6) How often have you had to push or strain to begin urination?  0 - Not at all  7) How many times did you most typically get up to urinate from the time you went to bed until the time you got up in the morning?  0 - None  Total score:  0-7 mildly symptomatic   8-19 moderately symptomatic   20-35 severely symptomatic     Functional Status Survey: Is the patient deaf or have difficulty hearing?: No Does the patient have difficulty seeing, even when wearing glasses/contacts?: No Does the patient have difficulty concentrating, remembering, or making decisions?: No Does the patient have difficulty walking or climbing stairs?: No Does the patient have difficulty dressing or bathing?: No Does the patient have difficulty doing errands alone such as visiting a doctor's office or shopping?: No  FALL RISK:    06/27/2022    8:46 AM 06/25/2021   10:02 AM 06/23/2021    9:41 AM 06/23/2021    8:52 AM 05/14/2020   10:35 AM  [place] in the past year? 0 0 0 0 0  Number falls in past yr: 0 0 0 0   Injury with Fall? 0 0 0 0   Risk for fall due to : No Fall Risks No Fall Risks No Fall Risks No Fall Risks Medication side effect  Follow up Falls evaluation completed Falls evaluation completed Education provided Falls evaluation completed Falls evaluation completed;Education provided;Falls prevention discussed    Depression Screen    06/27/2022    8:44 AM 12/21/2021    8:57 AM 06/25/2021    9:57 AM 06/23/2021    9:41 AM 06/23/2021    8:53 AM  Depression screen PHQ 2/9  Decreased Interest 0 0 0 0 0  Down, Depressed, Hopeless 0 0 0 0 0  PHQ - 2 Score 0 0 0 0 0  Altered sleeping 0 0  0   Tired, decreased energy 0 0  0   Change in appetite 0 0  0   Feeling bad or failure about yourself  0 0  0   Trouble concentrating 0 0  0   Moving slowly or fidgety/restless 0 0  0   Suicidal thoughts 0 0  0   PHQ-9 Score 0 0  0   Difficult doing work/chores Not difficult at all   Not difficult at all     Advanced Directives <no information>  Past Medical History:  Past Medical History:  Diagnosis Date   Anginal pain ([place])    Arthritis    COPD (chronic obstructive pulmonary disease) ([place])    Erectile disorder due to medical condition in male    Gout    Hyperlipidemia    Hypertension    Renal cyst, right 09/14/2019    Shortness of breath dyspnea     Surgical History:  Past Surgical History:  Procedure Laterality Date   arm surgery Left    CARDIAC CATHETERIZATION     TONSILLECTOMY      Medications:  Current Outpatient Medications on File Prior to Visit  Medication Sig   acetaminophen (TYLENOL) 500 MG tablet Take 500 mg by mouth every 6 (six) hours as needed.   albuterol (PROVENTIL HFA;VENTOLIN HFA) 108 (90 Base) MCG/ACT inhaler Inhale 2 puffs into the lungs every 6 (six) hours as needed for wheezing or shortness of breath.   aspirin 81 MG tablet Take 1 tablet (81 mg total) by mouth daily.   magnesium  oxide (MAG-OX) 400 MG tablet Take 800 mg by mouth every evening.   tiZANidine (ZANAFLEX) 4 MG tablet TAKE 1 TABLET (4 MG TOTAL) BY MOUTH EVERY 6 (SIX) HOURS AS NEEDED FOR MUSCLE SPASMS.   triamcinolone (KENALOG) 0.1 % Apply 1 application topically 2 (two) times daily.   UNABLE TO FIND Take 2 tablets by mouth daily. Omega OX   No current facility-administered medications on file prior to visit.    Allergies:  No Known Allergies  Social History:  Social History   Socioeconomic History   Marital status: Married    Spouse name: Not on file   Number of children: Not on file   Years of education: Not on file   Highest education level: 9th grade  Occupational History   Occupation: retired  Tobacco Use   Smoking status: Former    Packs/day: 0.25    Years: 62.00    Total pack years: 15.50    Types: Cigarettes    Quit date: 01/07/2018    Years since quitting: 4.4   Smokeless tobacco: Never  Vaping Use   Vaping Use: Never used  Substance and Sexual Activity   Alcohol use: Yes    Alcohol/week: 5.0 standard drinks of alcohol    Types: 3 Cans of beer, 2 Shots of liquor per week    Comment: 2 shots of liquor  a week, one beer occasionally    Drug use: No   Sexual activity: Not Currently  Other Topics Concern   Not on file  Social History Narrative   Not on file   Social Determinants of  Health   Financial Resource Strain: Low Risk  (06/27/2022)   Overall Financial Resource Strain (CARDIA)    Difficulty of Paying Living Expenses: Not hard at all  Food Insecurity: No Food Insecurity (06/27/2022)   Hunger Vital Sign    Worried About Running Out of Food in the Last Year: Never true    Ran Out of Food in the Last Year: Never true  Transportation Needs: No Transportation Needs (06/27/2022)   PRAPARE - Hydrologist (Medical): No    Lack of Transportation (Non-Medical): No  Physical Activity: Sufficiently Active (06/27/2022)   Exercise Vital Sign    Days of Exercise per Week: 7 days    Minutes of Exercise per Session: 30 min  Stress: No Stress Concern Present (06/27/2022)   [place]    Feeling of Stress : Not at all  Social Connections: Socially Isolated (06/27/2022)   Social Connection and Isolation Panel [NHANES]    Frequency of Communication with Friends and Family: Never    Frequency of Social Gatherings with Friends and Family: Never    Attends Religious Services: Never    Marine scientist or Organizations: No    Attends Archivist Meetings: Never    Marital Status: Married  Human resources officer Violence: Not At Risk (06/27/2022)   Humiliation, Afraid, Rape, and Kick questionnaire    Fear of Current or Ex-Partner: No    Emotionally Abused: No    Physically Abused: No    Sexually Abused: No   Social History   Tobacco Use  Smoking Status Former   Packs/day: 0.25   Years: 62.00   Total pack years: 15.50   Types: Cigarettes   Quit date: 01/07/2018   Years since quitting: 4.4  Smokeless Tobacco Never   Social History   Substance and Sexual Activity  Alcohol  Use Yes   Alcohol/week: 5.0 standard drinks of alcohol   Types: 3 Cans of beer, 2 Shots of liquor per week   Comment: 2 shots of liquor  a week, one beer occasionally     Family History:  Family  History  Problem Relation Age of Onset   Renal Disease Mother    Diabetes Mother    Dementia Father    COPD Father    Emphysema Father    Cervical cancer Sister    Diabetes Sister    Bladder Cancer Brother    Hypertension Son    Heart disease Son    Heart disease Maternal Grandmother    Heart disease Maternal Grandfather    Heart disease Paternal Grandmother    Heart disease Paternal Grandfather    Aneurysm Sister    Hypertension Son     Past medical history, surgical history, medications, allergies, family history and social history reviewed with patient today and changes made to appropriate areas of the chart.   ROS All other ROS negative except what is listed above and in the HPI.      Objective:    BP 134/70 (BP Location: Left Arm, Patient Position: Sitting, Cuff Size: Normal)   Pulse 66   Temp 98.4 F (36.9 C) (Oral)   Ht 5' 8.5" (1.74 m)   Wt 196 lb 9.6 oz (89.2 kg)   SpO2 98%   BMI 29.46 kg/m   Wt Readings from Last 3 Encounters:  06/27/22 196 lb 9.6 oz (89.2 kg)  06/27/22 196 lb 9.6 oz (89.2 kg)  12/21/21 204 lb 6.4 oz (92.7 kg)    No results found.  Physical Exam Vitals and nursing note reviewed.  Constitutional:      General: He is awake. He is not in acute distress.    Appearance: He is well-developed and well-groomed. He is obese. He is not ill-appearing or toxic-appearing.  HENT:     Head: Normocephalic and atraumatic.     Right Ear: Hearing, tympanic membrane, ear canal and external ear normal. No drainage.     Left Ear: Hearing, tympanic membrane, ear canal and external ear normal. No drainage.     Nose: Nose normal.     Mouth/Throat:     Mouth: Mucous membranes are moist.  Eyes:     General: Lids are normal.        Right eye: No discharge.        Left eye: No discharge.     Extraocular Movements: Extraocular movements intact.     Conjunctiva/sclera: Conjunctivae normal.     Pupils: Pupils are equal, round, and reactive to light.      Visual Fields: Right eye visual fields normal and left eye visual fields normal.  Neck:     Thyroid: No thyromegaly.     Vascular: No carotid bruit or JVD.     Trachea: Trachea normal.  Cardiovascular:     Rate and Rhythm: Normal rate and regular rhythm.     Heart sounds: Normal heart sounds, S1 normal and S2 normal. No murmur heard.    No gallop.  Pulmonary:     Effort: Pulmonary effort is normal. No accessory muscle usage or respiratory distress.     Breath sounds: Normal breath sounds.  Abdominal:     General: Bowel sounds are normal.     Palpations: Abdomen is soft. There is no hepatomegaly or splenomegaly.     Tenderness: There is no abdominal tenderness.  Musculoskeletal:  General: Normal range of motion.     Cervical back: Normal range of motion and neck supple.     Right lower leg: No edema.     Left lower leg: No edema.  Lymphadenopathy:     Head:     Right side of head: No submental, submandibular, tonsillar, preauricular or posterior auricular adenopathy.     Left side of head: No submental, submandibular, tonsillar, preauricular or posterior auricular adenopathy.     Cervical: No cervical adenopathy.  Skin:    General: Skin is warm and dry.     Capillary Refill: Capillary refill takes less than 2 seconds.     Findings: No rash.  Neurological:     Mental Status: He is alert and oriented to person, place, and time.     Gait: Gait is intact.     Deep Tendon Reflexes: Reflexes are normal and symmetric.     Reflex Scores:      Brachioradialis reflexes are 2+ on the right side and 2+ on the left side.      Patellar reflexes are 2+ on the right side and 2+ on the left side. Psychiatric:        Attention and Perception: Attention normal.        Mood and Affect: Mood normal.        Speech: Speech normal.        Behavior: Behavior normal. Behavior is cooperative.        Thought Content: Thought content normal.        Cognition and Memory: Cognition normal.         Judgment: Judgment normal.       06/27/2022    8:46 AM 06/25/2021   10:03 AM 06/23/2021    9:40 AM 05/14/2020   10:37 AM 05/08/2018    8:51 AM  6CIT Screen  What Year? 0 points 0 points 0 points 0 points 0 points  What month? 0 points 0 points 0 points 0 points 0 points  What time? 0 points 0 points 0 points 0 points 0 points  Count back from 20 0 points 0 points 0 points 0 points 0 points  Months in reverse 4 points 4 points 0 points 0 points 0 points  Repeat phrase 0 points 10 points 0 points 2 points 0 points  Total Score 4 points 14 points 0 points 2 points 0 points   Results for orders placed or performed in visit on 12/21/21  Microalbumin, Urine Waived  Result Value Ref Range   Microalb, Ur Waived 10 0 - 19 mg/L   Creatinine, Urine Waived 200 10 - 300 mg/dL   Microalb/Creat Ratio <30 <30 mg/g  Comprehensive metabolic panel  Result Value Ref Range   Glucose 108 (H) 70 - 99 mg/dL   BUN 14 8 - 27 mg/dL   Creatinine, Ser 0.99 0.76 - 1.27 mg/dL   eGFR 81 >59 mL/min/1.73   BUN/Creatinine Ratio 14 10 - 24   Sodium 142 134 - 144 mmol/L   Potassium 4.2 3.5 - 5.2 mmol/L   Chloride 104 96 - 106 mmol/L   CO2 21 20 - 29 mmol/L   Calcium 9.2 8.6 - 10.2 mg/dL   Total Protein 7.2 6.0 - 8.5 g/dL   Albumin 4.4 3.7 - 4.7 g/dL   Globulin, Total 2.8 1.5 - 4.5 g/dL   Albumin/Globulin Ratio 1.6 1.2 - 2.2   Bilirubin Total 0.6 0.0 - 1.2 mg/dL   Alkaline Phosphatase 91 44 - 121  IU/L   AST 28 0 - 40 IU/L   ALT 18 0 - 44 IU/L  Lipid Panel w/o Chol/HDL Ratio  Result Value Ref Range   Cholesterol, Total 116 100 - 199 mg/dL   Triglycerides 171 (H) 0 - 149 mg/dL   HDL 39 (L) >39 mg/dL   VLDL Cholesterol Cal 29 5 - 40 mg/dL   LDL Chol Calc (NIH) 48 0 - 99 mg/dL  TSH  Result Value Ref Range   TSH 2.460 0.450 - 4.500 uIU/mL  T4, free  Result Value Ref Range   Free T4 1.43 0.82 - 1.77 ng/dL  Uric acid  Result Value Ref Range   Uric Acid 5.2 3.8 - 8.4 mg/dL      Assessment & Plan:    Problem List Items Addressed This Visit       Cardiovascular and Mediastinum   Aortic atherosclerosis (HCC)    Ongoing on imaging.  Continue Lipitor and ASA daily for prevention.  Continue complete cessation of smoking.  Continue yearly lung CT scans.      Relevant Medications   amLODipine (NORVASC) 5 MG tablet   atorvastatin (LIPITOR) 10 MG tablet   losartan (COZAAR) 100 MG tablet   metoprolol succinate (TOPROL-XL) 25 MG 24 hr tablet   Other Relevant Orders   Comprehensive metabolic panel   Lipid Panel w/o Chol/HDL Ratio   Essential hypertension, benign    Chronic, ongoing.  BP initially elevated, but repeat at goal. Recommend he monitor BP at least a few mornings a week at home and document.  DASH diet at home.  Continue current medication regimen and adjust as needed.  Labs today: CBC, CMP, Lipid.  Refills sent in.  Return in 6 months.       Relevant Medications   amLODipine (NORVASC) 5 MG tablet   atorvastatin (LIPITOR) 10 MG tablet   losartan (COZAAR) 100 MG tablet   metoprolol succinate (TOPROL-XL) 25 MG 24 hr tablet   Other Relevant Orders   CBC with Differential/Platelet     Respiratory   Centrilobular emphysema (HCC) - Primary    Chronic, stable with no use of Albuterol.  Spirometry today FEV1 94% and FEV1/FVC 118% and previous in 2022 was FEV1 91% and FEV1/FVC 108% - remaining stable.  Continue current regimen and adjust as needed.  Continue annual CT lung screens until age 51.  Recommend continue cessation of smoking.  Return in 6 months.      Relevant Orders   CBC with Differential/Platelet   Spirometry with Graph (Completed)     Digestive   Hepatic steatosis    Ongoing on imaging.  Discussed focus on healthy diet and modest weight loss + cut back on alcohol intake.  Check CMP today.      Relevant Orders   Comprehensive metabolic panel     Endocrine   Subclinical hypothyroidism    Chronic, ongoing.  Diagnosed 08/05/21.  Continue current medication  regimen and adjust as needed.  Recheck TSH, Free T4 today.      Relevant Medications   levothyroxine (SYNTHROID) 50 MCG tablet   metoprolol succinate (TOPROL-XL) 25 MG 24 hr tablet   Other Relevant Orders   TSH   T4, free     Genitourinary   BPH (benign prostatic hyperplasia)    Chronic, stable with AUA = 0.  Continue current medication regimen and adjust as needed.  Refills today.       Relevant Medications   tamsulosin (FLOMAX) 0.4 MG CAPS capsule  Other Relevant Orders   PSA   Renal cyst, right    Noted on CT lung screening and remains stable in size.  Monitor closely and refer to nephrology as needed.        Other   Chronic gout    Chronic, stable with Allopurinol 100 MG daily, can adjust dose as needed.  Recheck uric acid level today and treat flares as needed.  Recommend monitoring diet and continue to cut back on alcohol use.      Relevant Orders   Uric acid   History of smoking    Continue cessation of smoking and yearly CT scan until age 65 + obtain AAA screening, discussed with patient.      Hypercholesteremia    Chronic, ongoing.  Continue current medication regimen and adjust as needed.  Lipid panel today, fasting.  Refills sent in.      Relevant Medications   amLODipine (NORVASC) 5 MG tablet   atorvastatin (LIPITOR) 10 MG tablet   losartan (COZAAR) 100 MG tablet   metoprolol succinate (TOPROL-XL) 25 MG 24 hr tablet   Other Relevant Orders   Comprehensive metabolic panel   Lipid Panel w/o Chol/HDL Ratio   Restless leg syndrome    Stable with treatment of thyroid and continued magnesium supplement, continue current treatments and adjust as needed.      Relevant Orders   Magnesium   Other Visit Diagnoses     Screening for AAA (abdominal aortic aneurysm)       AAA screening ordered.   Relevant Orders   US AORTA MEDICARE SCREENING   Encounter for annual physical exam       Annual physical today with labs and health maintenance reviewed, discussed  with patient.        Discussed aspirin prophylaxis for myocardial infarction prevention and decision was made to continue ASA  LABORATORY TESTING:  Health maintenance labs ordered today as discussed above.   The natural history of prostate cancer and ongoing controversy regarding screening and potential treatment outcomes of prostate cancer has been discussed with the patient. The meaning of a false positive PSA and a false negative PSA has been discussed. He indicates understanding of the limitations of this screening test and wishes to proceed with screening PSA testing.   IMMUNIZATIONS:   - Tdap: Tetanus vaccination status reviewed: last tetanus booster within 10 years. - Influenza: Will get at pharmacy with Covid vaccine - Pneumovax: Up to date - Prevnar: Up to date - Zostavax vaccine: Up to date - Covid: going to get at pharmacy  SCREENING: - Colonoscopy: Cologuard up to date == 06/28/2024 Discussed with patient purpose of the colonoscopy is to detect colon cancer at curable precancerous or early stages   - AAA Screening: Ordered today  -Hearing Test: Not applicable  -Spirometry: Up to date   PATIENT COUNSELING:    Sexuality: Discussed sexually transmitted diseases, partner selection, use of condoms, avoidance of unintended pregnancy  and contraceptive alternatives.   Advised to avoid cigarette smoking.  I discussed with the patient that most people either abstain from alcohol or drink within safe limits (<=14/week and <=4 drinks/occasion for males, <=7/weeks and <= 3 drinks/occasion for females) and that the risk for alcohol disorders and other health effects rises proportionally with the number of drinks per week and how often a drinker exceeds daily limits.  Discussed cessation/primary prevention of drug use and availability of treatment for abuse.   Diet: Encouraged to adjust caloric intake to maintain  or  achieve ideal body weight, to reduce intake of dietary saturated  fat and total fat, to limit sodium intake by avoiding high sodium foods and not adding table salt, and to maintain adequate dietary potassium and calcium preferably from fresh fruits, vegetables, and low-fat dairy products.    Stressed the importance of regular exercise  Injury prevention: Discussed safety belts, safety helmets, smoke detector, smoking near bedding or upholstery.   Dental health: Discussed importance of regular tooth brushing, flossing, and dental visits.   Follow up plan: NEXT PREVENTATIVE PHYSICAL DUE IN 1 YEAR. Return in about 6 months (around 12/26/2022) for HTN/HLD, COPD, BPH, THYROID.

## 2022-06-27 NOTE — Assessment & Plan Note (Signed)
Chronic, ongoing.  BP initially elevated, but repeat at goal. Recommend he monitor BP at least a few mornings a week at home and document.  DASH diet at home.  Continue current medication regimen and adjust as needed.  Labs today: CBC, CMP, Lipid.  Refills sent in.  Return in 6 months.

## 2022-06-27 NOTE — Assessment & Plan Note (Addendum)
Chronic, stable with no use of Albuterol.  Spirometry today FEV1 94% and FEV1/FVC 118% and previous in 2022 was FEV1 91% and FEV1/FVC 108% - remaining stable.  Continue current regimen and adjust as needed.  Continue annual CT lung screens until age 71.  Recommend continue cessation of smoking.  Return in 6 months.

## 2022-06-27 NOTE — Progress Notes (Signed)
Subjective:   William Murray is a 71 y.o. male who presents for Medicare Annual/Subsequent preventive examination.  Review of Systems    Defer to PCP.        Objective:    Today's Vitals   06/27/22 0836 06/27/22 0838 06/27/22 0851  BP: (!) 158/65  (!) 147/64  Pulse: 60  (!) 57  Temp: 98.4 F (36.9 C)    TempSrc: Oral    SpO2: 98%    Weight: 196 lb 9.6 oz (89.2 kg)    Height: 5' 8.5" (1.74 m)    PainSc: 0-No pain 0-No pain    Body mass index is 29.46 kg/m.     06/25/2021   10:00 AM 05/14/2020   10:34 AM 05/14/2019    8:19 AM 05/08/2018    8:47 AM 05/04/2017    8:16 AM 01/16/2017    8:04 AM 04/28/2015    5:11 PM  Advanced Directives  Does Patient Have a Medical Advance Directive? No;Yes Yes Yes No No No No  Type of Advance Directive Living will Healthcare Power of [place];Living will Living will;Healthcare Power of Attorney      Does patient want to make changes to medical advance directive? Yes (MAU/Ambulatory/Procedural Areas - Information given)        Copy of Healthcare Power of Attorney in Chart? No - copy requested No - copy requested Yes - validated most recent copy scanned in chart (See row information)      Would patient like information on creating a medical advance directive? No - Patient declined   Yes (MAU/Ambulatory/Procedural Areas - Information given) Yes (MAU/Ambulatory/Procedural Areas - Information given)  Yes - Educational materials given    Current Medications (verified) Outpatient Encounter Medications as of 06/27/2022  Medication Sig   acetaminophen (TYLENOL) 500 MG tablet Take 500 mg by mouth every 6 (six) hours as needed.   albuterol (PROVENTIL HFA;VENTOLIN HFA) 108 (90 Base) MCG/ACT inhaler Inhale 2 puffs into the lungs every 6 (six) hours as needed for wheezing or shortness of breath.   allopurinol (ZYLOPRIM) 100 MG tablet Take 1 tablet (100 mg total) by mouth daily.   amLODipine (NORVASC) 5 MG tablet Take 1 tablet (5 mg total) by mouth daily.    aspirin 81 MG tablet Take 1 tablet (81 mg total) by mouth daily.   atorvastatin (LIPITOR) 10 MG tablet Take 1 tablet (10 mg total) by mouth daily at 6 PM.   levothyroxine (SYNTHROID) 50 MCG tablet Take 1 tablet (50 mcg total) by mouth daily.   losartan (COZAAR) 100 MG tablet Take 1 tablet (100 mg total) by mouth daily.   magnesium oxide (MAG-OX) 400 MG tablet Take 800 mg by mouth every evening.   metoprolol succinate (TOPROL-XL) 25 MG 24 hr tablet Take 1 tablet (25 mg total) by mouth daily.   tamsulosin (FLOMAX) 0.4 MG CAPS capsule Take 1 capsule (0.4 mg total) by mouth daily.   tiZANidine (ZANAFLEX) 4 MG tablet TAKE 1 TABLET (4 MG TOTAL) BY MOUTH EVERY 6 (SIX) HOURS AS NEEDED FOR MUSCLE SPASMS.   triamcinolone (KENALOG) 0.1 % Apply 1 application topically 2 (two) times daily.   UNABLE TO FIND Take 2 tablets by mouth daily. Omega OX   No facility-administered encounter medications on file as of 06/27/2022.    Allergies (verified) Patient has no known allergies.   History: Past Medical History:  Diagnosis Date   Anginal pain (HCC)    Arthritis    COPD (chronic obstructive pulmonary disease) (HCC)  Erectile disorder due to medical condition in male    Gout    Hyperlipidemia    Hypertension    Renal cyst, right 09/14/2019   Shortness of breath dyspnea    Past Surgical History:  Procedure Laterality Date   arm surgery Left    CARDIAC CATHETERIZATION     TONSILLECTOMY     Family History  Problem Relation Age of Onset   Renal Disease Mother    Diabetes Mother    Dementia Father    COPD Father    Emphysema Father    Cervical cancer Sister    Diabetes Sister    Bladder Cancer Brother    Hypertension Son    Heart disease Son    Heart disease Maternal Grandmother    Heart disease Maternal Grandfather    Heart disease Paternal Grandmother    Heart disease Paternal Grandfather    Aneurysm Sister    Hypertension Son    Social History   Socioeconomic History   Marital  status: Married    Spouse name: Not on file   Number of children: Not on file   Years of education: Not on file   Highest education level: 9th grade  Occupational History   Occupation: retired  Tobacco Use   Smoking status: Former    Packs/day: 0.25    Years: 62.00    Total pack years: 15.50    Types: Cigarettes    Quit date: 01/07/2018    Years since quitting: 4.4   Smokeless tobacco: Never  Vaping Use   Vaping Use: Never used  Substance and Sexual Activity   Alcohol use: Yes    Alcohol/week: 5.0 standard drinks of alcohol    Types: 3 Cans of beer, 2 Shots of liquor per week    Comment: 2 shots of liquor  a week, one beer occasionally    Drug use: No   Sexual activity: Not Currently  Other Topics Concern   Not on file  Social History Narrative   Not on file   Social Determinants of Health   Financial Resource Strain: Low Risk  (06/27/2022)   Overall Financial Resource Strain (CARDIA)    Difficulty of Paying Living Expenses: Not hard at all  Food Insecurity: No Food Insecurity (06/27/2022)   Hunger Vital Sign    Worried About Running Out of Food in the Last Year: Never true    Ran Out of Food in the Last Year: Never true  Transportation Needs: No Transportation Needs (06/27/2022)   PRAPARE - Administrator, Civil Service (Medical): No    Lack of Transportation (Non-Medical): No  Physical Activity: Sufficiently Active (06/27/2022)   Exercise Vital Sign    Days of Exercise per Week: 7 days    Minutes of Exercise per Session: 30 min  Stress: No Stress Concern Present (06/27/2022)   [employer] of Occupational Health - Occupational Stress Questionnaire    Feeling of Stress : Not at all  Social Connections: Socially Isolated (06/27/2022)   Social Connection and Isolation Panel [NHANES]    Frequency of Communication with Friends and Family: Never    Frequency of Social Gatherings with Friends and Family: Never    Attends Religious Services: Never    Automotive engineer or Organizations: No    Attends Banker Meetings: Never    Marital Status: Married    Tobacco Counseling Counseling given: Not Answered   Clinical Intake:  Pre-visit preparation completed: Yes  Pain :  No/denies pain Pain Score: 0-No pain   BMI - recorded: 29.46 Nutritional Status: BMI 25 -29 Overweight Nutritional Risks: None Diabetes: No  How often do you need to have someone help you when you read instructions, pamphlets, or other written materials from your doctor or pharmacy?: 3 - Sometimes What is the last grade level you completed in school?: 9th grade  Diabetic- No  Interpreter Needed?: No    Activities of Daily Living    06/27/2022    8:46 AM  In your present state of health, do you have any difficulty performing the following activities:  Hearing? 0  Vision? 0  Difficulty concentrating or making decisions? 0  Walking or climbing stairs? 0  Dressing or bathing? 0  Doing errands, shopping? 0    Patient Care Team: Marjie Skiffannady, Jolene T, NP as PCP - General (Nurse Practitioner)  Indicate any recent Medical Services you may have received from other than [facility] providers in the past year (date may be approximate).     Assessment:   This is a routine wellness examination for [place].  Hearing/Vision screen No results found.  Dietary issues and exercise activities discussed:     Goals Addressed   None   Depression Screen    06/27/2022    8:44 AM 12/21/2021    8:57 AM 06/25/2021    9:57 AM 06/23/2021    9:41 AM 06/23/2021    8:53 AM 05/14/2020   10:35 AM 05/14/2019    8:18 AM  PHQ 2/9 Scores  PHQ - 2 Score 0 0 0 0 0 0 0  PHQ- 9 Score 0 0  0  0     Fall Risk    06/27/2022    8:46 AM 06/25/2021   10:02 AM 06/23/2021    9:41 AM 06/23/2021    8:52 AM 05/14/2020   10:35 AM  Fall Risk   Falls in the past year? 0 0 0 0 0  Number falls in past yr: 0 0 0 0   Injury with Fall? 0 0 0 0   Risk for fall due to : No Fall Risks No Fall  Risks No Fall Risks No Fall Risks Medication side effect  Follow up Falls evaluation completed Falls evaluation completed Education provided Falls evaluation completed Falls evaluation completed;Education provided;Falls prevention discussed    FALL RISK PREVENTION PERTAINING TO THE HOME:  Any stairs in or around the home? Yes  If so, are there any without handrails? No Home free of loose throw rugs in walkways, pet beds, electrical cords, etc? Yes  Adequate lighting in your home to reduce risk of falls? Yes   ASSISTIVE DEVICES UTILIZED TO PREVENT FALLS:  Life alert? No  Use of a cane, walker or w/c? No  Grab bars in the bathroom? No  Shower chair or bench in shower? No  Elevated toilet seat or a handicapped toilet? No   TIMED UP AND GO:  Was the test performed? No .     Cognitive Function:        06/27/2022    8:46 AM 06/25/2021   10:03 AM 06/23/2021    9:40 AM 05/14/2020   10:37 AM 05/08/2018    8:51 AM  6CIT Screen  What Year? 0 points 0 points 0 points 0 points 0 points  What month? 0 points 0 points 0 points 0 points 0 points  What time? 0 points 0 points 0 points 0 points 0 points  Count back from 20 0 points 0 points  0 points 0 points 0 points  Months in reverse 4 points 4 points 0 points 0 points 0 points  Repeat phrase 0 points 10 points 0 points 2 points 0 points  Total Score 4 points 14 points 0 points 2 points 0 points    Immunizations Immunization History  Administered Date(s) Administered   Fluad Quad(high Dose 65+) 09/02/2019, 06/21/2020, 06/23/2021   Influenza, High Dose Seasonal PF 08/21/2017, 06/28/2018   PFIZER(Purple Top)SARS-COV-2 Vaccination 12/03/2019, 12/24/2019   Pneumococcal Conjugate-13 10/19/2013   Pneumococcal Polysaccharide-23 10/19/2014   Td 12/15/2009, 12/20/2020   Zoster Recombinat (Shingrix) 01/12/2021, 03/14/2021   Zoster, Live 10/19/2014    TDAP status: Up to date  Flu Vaccine status: Due, Education has been provided regarding  the importance of this vaccine. Advised may receive this vaccine at local pharmacy or Health Dept. Aware to provide a copy of the vaccination record if obtained from local pharmacy or Health Dept. Verbalized acceptance and understanding.  Pneumococcal vaccine status: Up to date  Covid-19 vaccine status: Information provided on how to obtain vaccines.   Qualifies for Shingles Vaccine? Yes   Zostavax completed Yes   Shingrix Completed?: Yes  Screening Tests Health Maintenance  Topic Date Due   COVID-19 Vaccine (3 - Pfizer series) 02/18/2020   INFLUENZA VACCINE  08/09/2022 (Originally 05/09/2022)   Fecal DNA (Cologuard)  06/28/2024   TETANUS/TDAP  12/21/2030   Pneumonia Vaccine 36+ Years old  Completed   Hepatitis C Screening  Completed   Zoster Vaccines- Shingrix  Completed   HPV VACCINES  Aged Out    Health Maintenance  Health Maintenance Due  Topic Date Due   COVID-19 Vaccine (3 - Pfizer series) 02/18/2020    Colorectal cancer screening: Type of screening: Cologuard. Completed 06/28/21. Repeat every 3 years  Lung Cancer Screening: (Low Dose CT Chest recommended if Age 64-80 years, 30 pack-year currently smoking OR have quit w/in 15years.) does qualify.   Lung Cancer Screening Referral: not needed, next scan scheduled for 08/10/22   Additional Screening:  Hepatitis C Screening: does qualify; Completed 04/17/17  Vision Screening: Recommended annual ophthalmology exams for early detection of glaucoma and other disorders of the eye. Is the patient up to date with their annual eye exam?  No  Who is the provider or what is the name of the office in which the patient attends annual eye exams? N/A If pt is not established with a provider, would they like to be referred to a provider to establish care? No .   Dental Screening: Recommended annual dental exams for proper oral hygiene   Community Resource Referral / Chronic Care Management: CRR required this visit?  No   CCM  required this visit?  No      Plan:     I have personally reviewed and noted the following in the patient's chart:   Medical and social history Use of alcohol, tobacco or illicit drugs  Current medications and supplements including opioid prescriptions. Patient is not currently taking opioid prescriptions. Functional ability and status Nutritional status Physical activity Advanced directives List of other physicians Hospitalizations, surgeries, and ER visits in previous 12 months Vitals Screenings to include cognitive, depression, and falls Referrals and appointments  In addition, I have reviewed and discussed with patient certain preventive protocols, quality metrics, and best practice recommendations. A written personalized care plan for preventive services as well as general preventive health recommendations were provided to patient.     Pablo Ledger, [place]   06/27/2022  Nurse Notes: Face to Face 30 minutes

## 2022-06-27 NOTE — Assessment & Plan Note (Signed)
Chronic, stable with Allopurinol 100 MG daily, can adjust dose as needed.  Recheck uric acid level today and treat flares as needed.  Recommend monitoring diet and continue to cut back on alcohol use.

## 2022-06-27 NOTE — Assessment & Plan Note (Signed)
Chronic, stable with AUA = 0.  Continue current medication regimen and adjust as needed.  Refills today.

## 2022-06-27 NOTE — Assessment & Plan Note (Signed)
Chronic, ongoing.  Diagnosed 08/05/21.  Continue current medication regimen and adjust as needed.  Recheck TSH, Free T4 today.

## 2022-06-28 LAB — CBC WITH DIFFERENTIAL/PLATELET
Basophils Absolute: 0 10*3/uL (ref 0.0–0.2)
Basos: 1 %
EOS (ABSOLUTE): 0.1 10*3/uL (ref 0.0–0.4)
Eos: 2 %
Hematocrit: 44.4 % (ref 37.5–51.0)
Hemoglobin: 15.3 g/dL (ref 13.0–17.7)
Immature Grans (Abs): 0 10*3/uL (ref 0.0–0.1)
Immature Granulocytes: 0 %
Lymphocytes Absolute: 2.7 10*3/uL (ref 0.7–3.1)
Lymphs: 34 %
MCH: 33.7 pg — ABNORMAL HIGH (ref 26.6–33.0)
MCHC: 34.5 g/dL (ref 31.5–35.7)
MCV: 98 fL — ABNORMAL HIGH (ref 79–97)
Monocytes Absolute: 0.6 10*3/uL (ref 0.1–0.9)
Monocytes: 8 %
Neutrophils Absolute: 4.5 10*3/uL (ref 1.4–7.0)
Neutrophils: 55 %
Platelets: 178 10*3/uL (ref 150–450)
RBC: 4.54 x10E6/uL (ref 4.14–5.80)
RDW: 12.7 % (ref 11.6–15.4)
WBC: 7.9 10*3/uL (ref 3.4–10.8)

## 2022-06-28 LAB — COMPREHENSIVE METABOLIC PANEL
ALT: 15 IU/L (ref 0–44)
AST: 33 IU/L (ref 0–40)
Albumin/Globulin Ratio: 1.6 (ref 1.2–2.2)
Albumin: 4.6 g/dL (ref 3.8–4.8)
Alkaline Phosphatase: 93 IU/L (ref 44–121)
BUN/Creatinine Ratio: 13 (ref 10–24)
BUN: 12 mg/dL (ref 8–27)
Bilirubin Total: 0.9 mg/dL (ref 0.0–1.2)
CO2: 20 mmol/L (ref 20–29)
Calcium: 9.4 mg/dL (ref 8.6–10.2)
Chloride: 103 mmol/L (ref 96–106)
Creatinine, Ser: 0.91 mg/dL (ref 0.76–1.27)
Globulin, Total: 2.8 g/dL (ref 1.5–4.5)
Glucose: 100 mg/dL — ABNORMAL HIGH (ref 70–99)
Potassium: 3.9 mmol/L (ref 3.5–5.2)
Sodium: 139 mmol/L (ref 134–144)
Total Protein: 7.4 g/dL (ref 6.0–8.5)
eGFR: 90 mL/min/{1.73_m2} (ref >59)

## 2022-06-28 LAB — LIPID PANEL W/O CHOL/HDL RATIO
Cholesterol, Total: 110 mg/dL (ref 100–199)
HDL: 36 mg/dL — ABNORMAL LOW (ref >39)
LDL Chol Calc (NIH): 38 mg/dL (ref 0–99)
Triglycerides: 227 mg/dL — ABNORMAL HIGH (ref 0–149)
VLDL Cholesterol Cal: 36 mg/dL (ref 5–40)

## 2022-06-28 LAB — URIC ACID: Uric Acid: 5.1 mg/dL (ref 3.8–8.4)

## 2022-06-28 LAB — TSH: TSH: 2.42 u[IU]/mL (ref 0.450–4.500)

## 2022-06-28 LAB — MAGNESIUM: Magnesium: 2 mg/dL (ref 1.6–2.3)

## 2022-06-28 LAB — T4, FREE: Free T4: 1.51 ng/dL (ref 0.82–1.77)

## 2022-06-28 LAB — PSA: Prostate Specific Ag, Serum: 1 ng/mL (ref 0.0–4.0)

## 2022-06-28 NOTE — Progress Notes (Signed)
Good morning, please let William Murray know his labs have returned: - CBC remains stable and show no anemia or infection. - Kidney function, creatinine and eGFR, remains normal, as is liver function, AST and ALT.  - Cholesterol levels show LDL at goal, continue Atorvastatin. - Thyroid levels normal, continue current Levothyroxine dosing.  - Remainder of labs all normal.  Great job!! Keep being amazing!!  Thank you for allowing me to participate in your care.  I appreciate you. Kindest regards, Makalah Asberry

## 2022-07-11 ENCOUNTER — Ambulatory Visit
Admission: RE | Admit: 2022-07-11 | Discharge: 2022-07-11 | Disposition: A | Discharge status: Home / Self Care | Payer: Medicare Other | Source: Ambulatory Visit | Attending: Nurse Practitioner | Admitting: Nurse Practitioner

## 2022-07-11 DIAGNOSIS — Z136 Encounter for screening for cardiovascular disorders: Secondary | ICD-10-CM | POA: Diagnosis not present

## 2022-07-11 DIAGNOSIS — Z87891 Personal history of nicotine dependence: Secondary | ICD-10-CM | POA: Insufficient documentation

## 2022-07-12 NOTE — Progress Notes (Signed)
Good morning, please let William Murray know that his aortic aneurysm screening returned and no aneurysm noted.  Great news!!  No follow-up imaging needed on this.  Have a wonderful day!!!

## 2022-07-26 IMAGING — CT CT CHEST LUNG CANCER SCREENING LOW DOSE W/O CM
2 of 5 series · 15 of 40 positions shown, 18 images · non-contrast
Comparison: 04/30/2020

CLINICAL DATA: Ex-smoker, quitting 2 years ago. 124 pack-year
history

EXAM:
CT CHEST WITHOUT CONTRAST LOW-DOSE FOR LUNG CANCER SCREENING
TECHNIQUE: Multidetector CT imaging of the chest was performed following the
standard protocol without IV contrast.

[Series 4: lung 1.00 · axial · 0.68mm/px · z∈[-1178,-873]mm · 12 of 337 slices shown, 15 images]
[im 16/337  mediastinal]
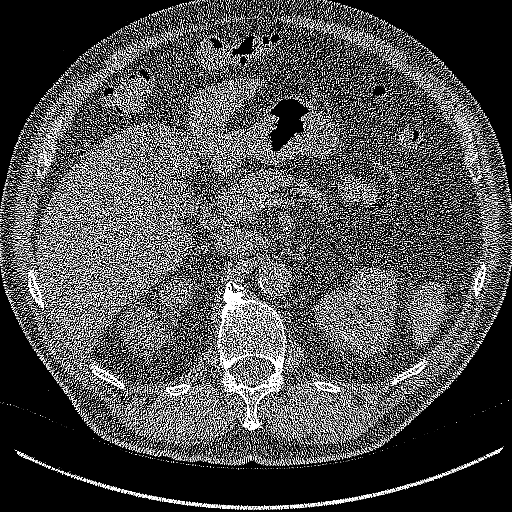
[im 16/337  lung]
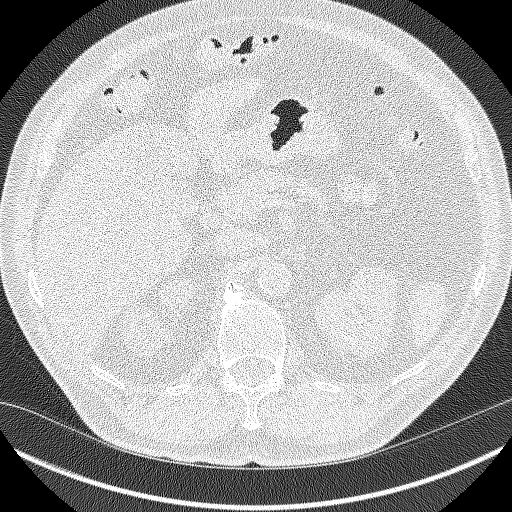
[im 46/337  lung]
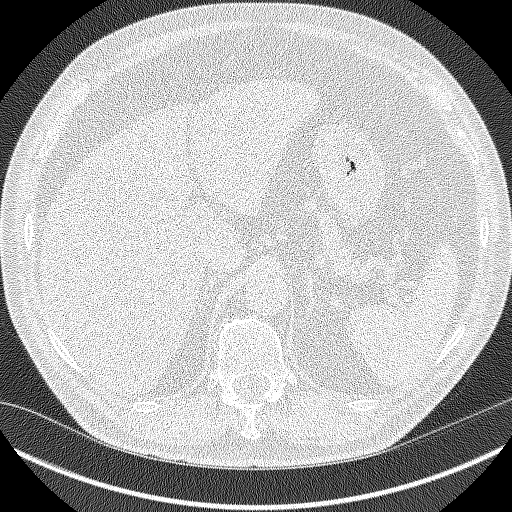
[im 77/337  lung]
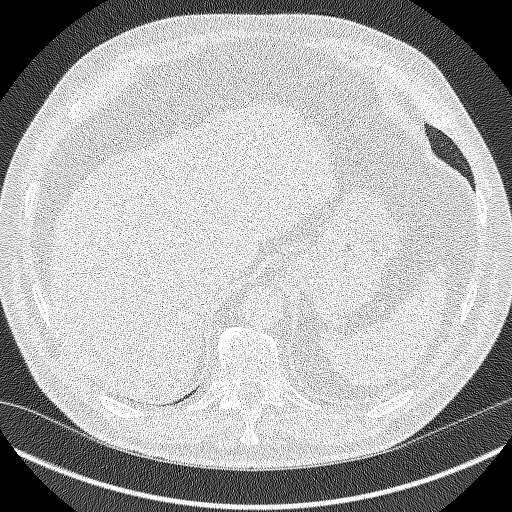
[im 107/337  lung]
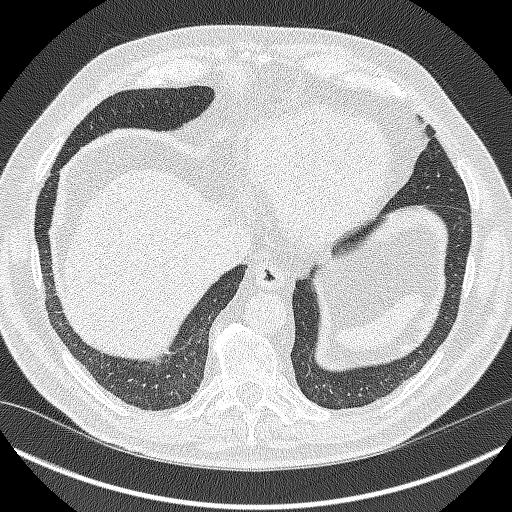
[im 123/337  mediastinal]
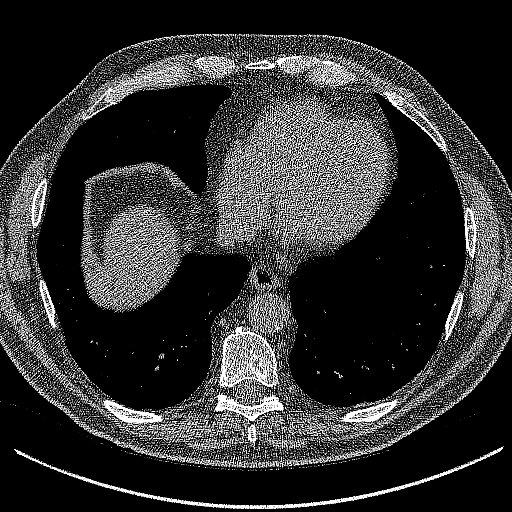
[im 123/337  lung]
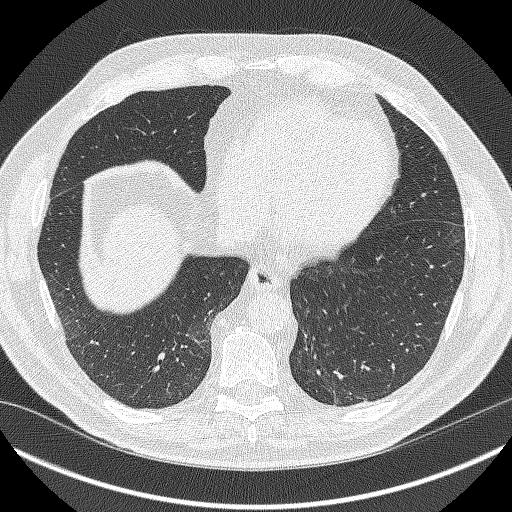
[im 153/337  lung]
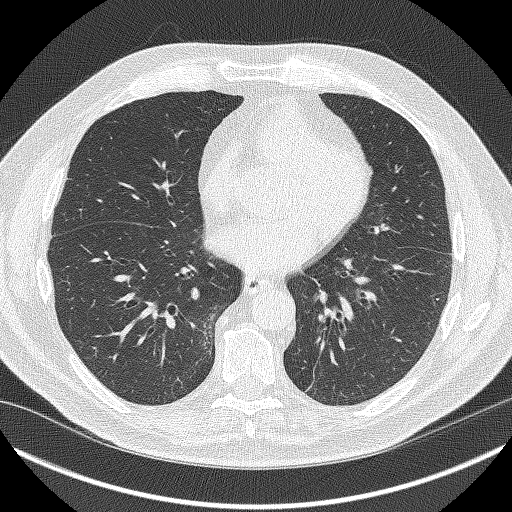
[im 184/337  lung]
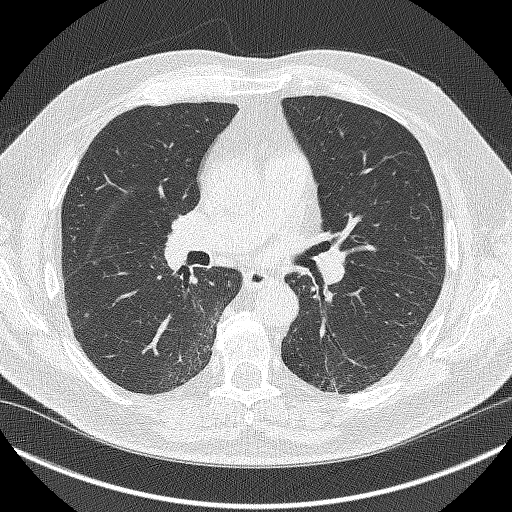
[im 214/337  lung]
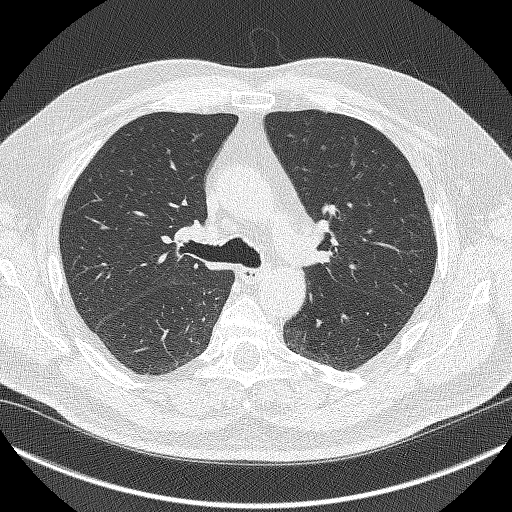
[im 230/337  mediastinal]
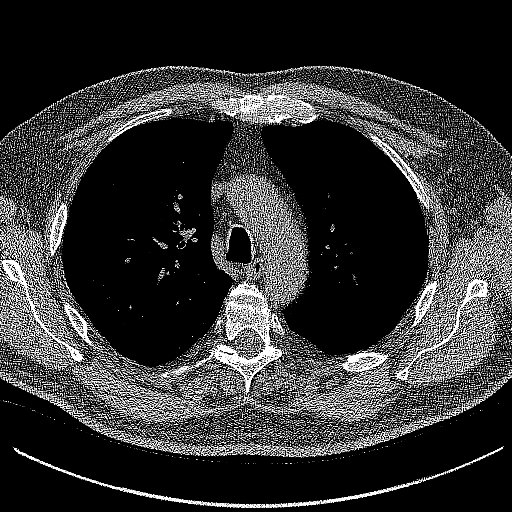
[im 230/337  lung]
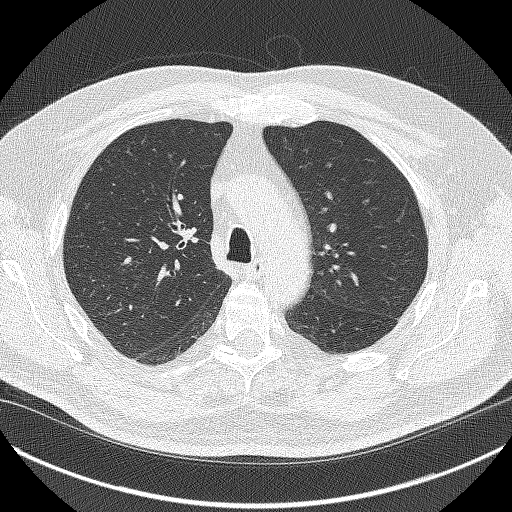
[im 260/337  lung]
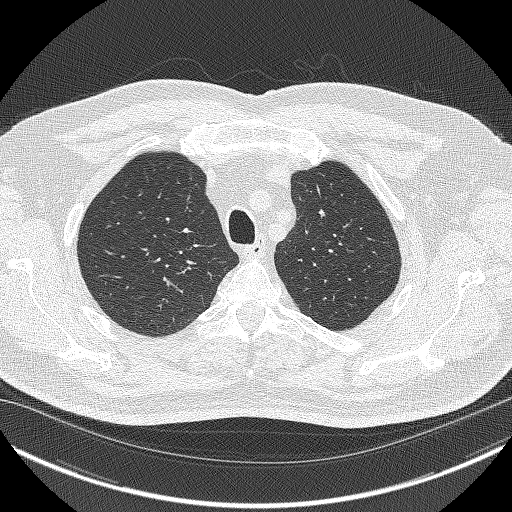
[im 291/337  lung]
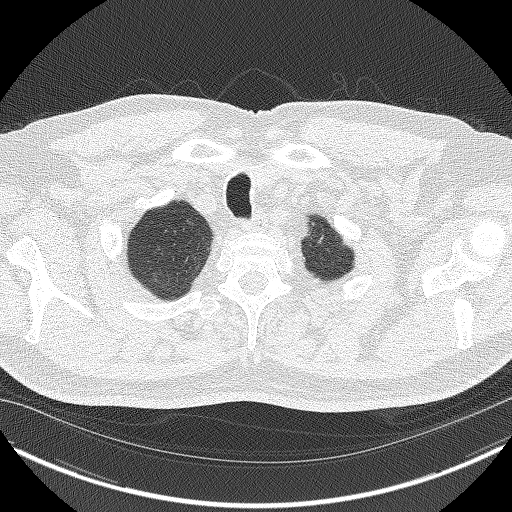
[im 321/337  lung]
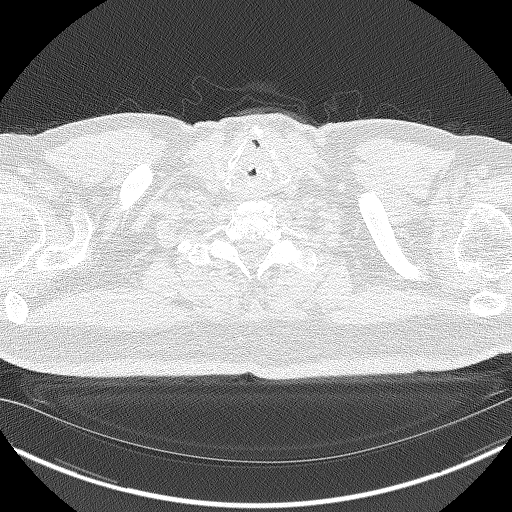

[Series 6: coronals lung 1.00 cor · coronal · 0.66mm/px · 3 of 315 slices shown]
[im 63/315  lung]
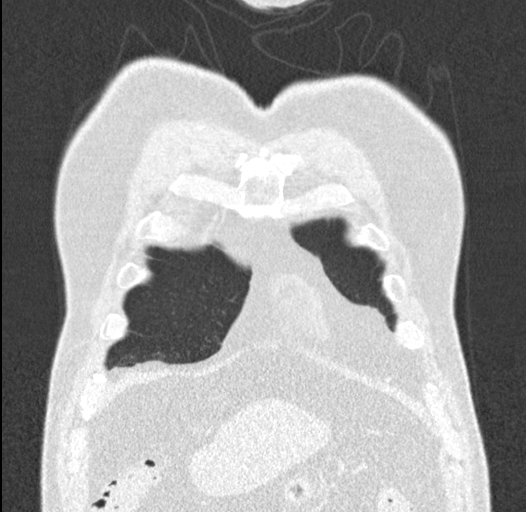
[im 126/315  lung]
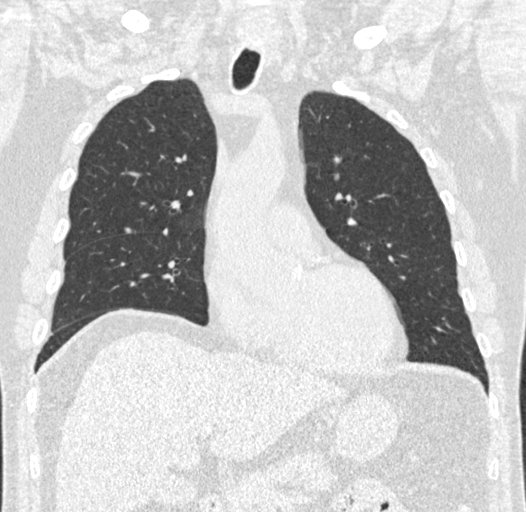
[im 189/315  lung]
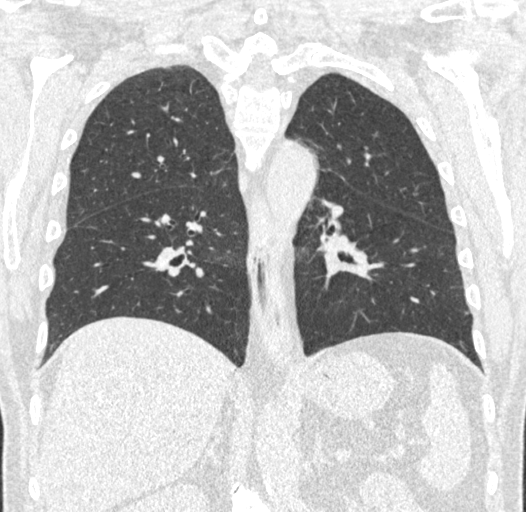

[15 of 40 positions shown; findings below may reference images not displayed]

FINDINGS: Cardiovascular: Aortic atherosclerosis. Normal heart size, without
pericardial effusion. Three vessel coronary artery calcification.

Mediastinum/Nodes: No mediastinal or definite hilar adenopathy,
given limitations of unenhanced CT.

Lungs/Pleura: No pleural fluid. Mild to moderate centrilobular
emphysema. Calcified and noncalcified pulmonary nodules are similar.
The largest noncalcified nodule measures volume derived equivalent
diameter 3.9 mm.

Upper Abdomen: Mild hepatic steatosis. Normal imaged portions of the
spleen, stomach, pancreas, gallbladder, adrenal glands, left kidney.
An incompletely imaged 3.9 cm upper pole right renal fluid density
lesion is likely a cyst.

Musculoskeletal: No acute osseous abnormality. Mild accentuation of
expected thoracic kyphosis.
IMPRESSION: 1. Lung-RADS 2, benign appearance or behavior. Continue annual
screening with low-dose chest CT without contrast in 12 months.
2. Hepatic steatosis.
3. Aortic Atherosclerosis (ENFO5-47S.S) and Emphysema (ENFO5-SUD.4).
Coronary artery atherosclerosis.

## 2022-08-10 ENCOUNTER — Ambulatory Visit
Admission: RE | Admit: 2022-08-10 | Discharge: 2022-08-10 | Disposition: A | Discharge status: Home / Self Care | Payer: Medicare Other | Source: Ambulatory Visit | Attending: Nurse Practitioner | Admitting: Nurse Practitioner

## 2022-08-10 DIAGNOSIS — Z87891 Personal history of nicotine dependence: Secondary | ICD-10-CM | POA: Diagnosis not present

## 2022-08-14 ENCOUNTER — Other Ambulatory Visit: Payer: Self-pay | Admitting: Acute Care

## 2022-08-14 DIAGNOSIS — Z122 Encounter for screening for malignant neoplasm of respiratory organs: Secondary | ICD-10-CM

## 2022-08-14 DIAGNOSIS — Z87891 Personal history of nicotine dependence: Secondary | ICD-10-CM

## 2022-08-22 ENCOUNTER — Encounter: Payer: Self-pay | Admitting: Nurse Practitioner

## 2022-09-28 DIAGNOSIS — H02884 Meibomian gland dysfunction left upper eyelid: Secondary | ICD-10-CM | POA: Diagnosis not present

## 2022-09-28 DIAGNOSIS — H5203 Hypermetropia, bilateral: Secondary | ICD-10-CM | POA: Diagnosis not present

## 2022-09-28 DIAGNOSIS — H2513 Age-related nuclear cataract, bilateral: Secondary | ICD-10-CM | POA: Diagnosis not present

## 2022-09-28 DIAGNOSIS — H02883 Meibomian gland dysfunction of right eye, unspecified eyelid: Secondary | ICD-10-CM | POA: Diagnosis not present

## 2022-09-28 DIAGNOSIS — G43109 Migraine with aura, not intractable, without status migrainosus: Secondary | ICD-10-CM | POA: Diagnosis not present

## 2022-12-07 IMAGING — CR DG HAND COMPLETE 3+V*L*
1 series · 3 of 3 positions shown · non-contrast
Comparison: None.

CLINICAL DATA: Ongoing left hand pain, right worse than left.
Technologist note reports primary osteoarthritis of bilateral hands.
Clinical concern for joint space loss.

EXAM:
LEFT HAND - COMPLETE 3+ VIEW

[Series 1: dg hand complete left · 0.14mm/px · 3 of 3 slices shown]
[im 1/3]
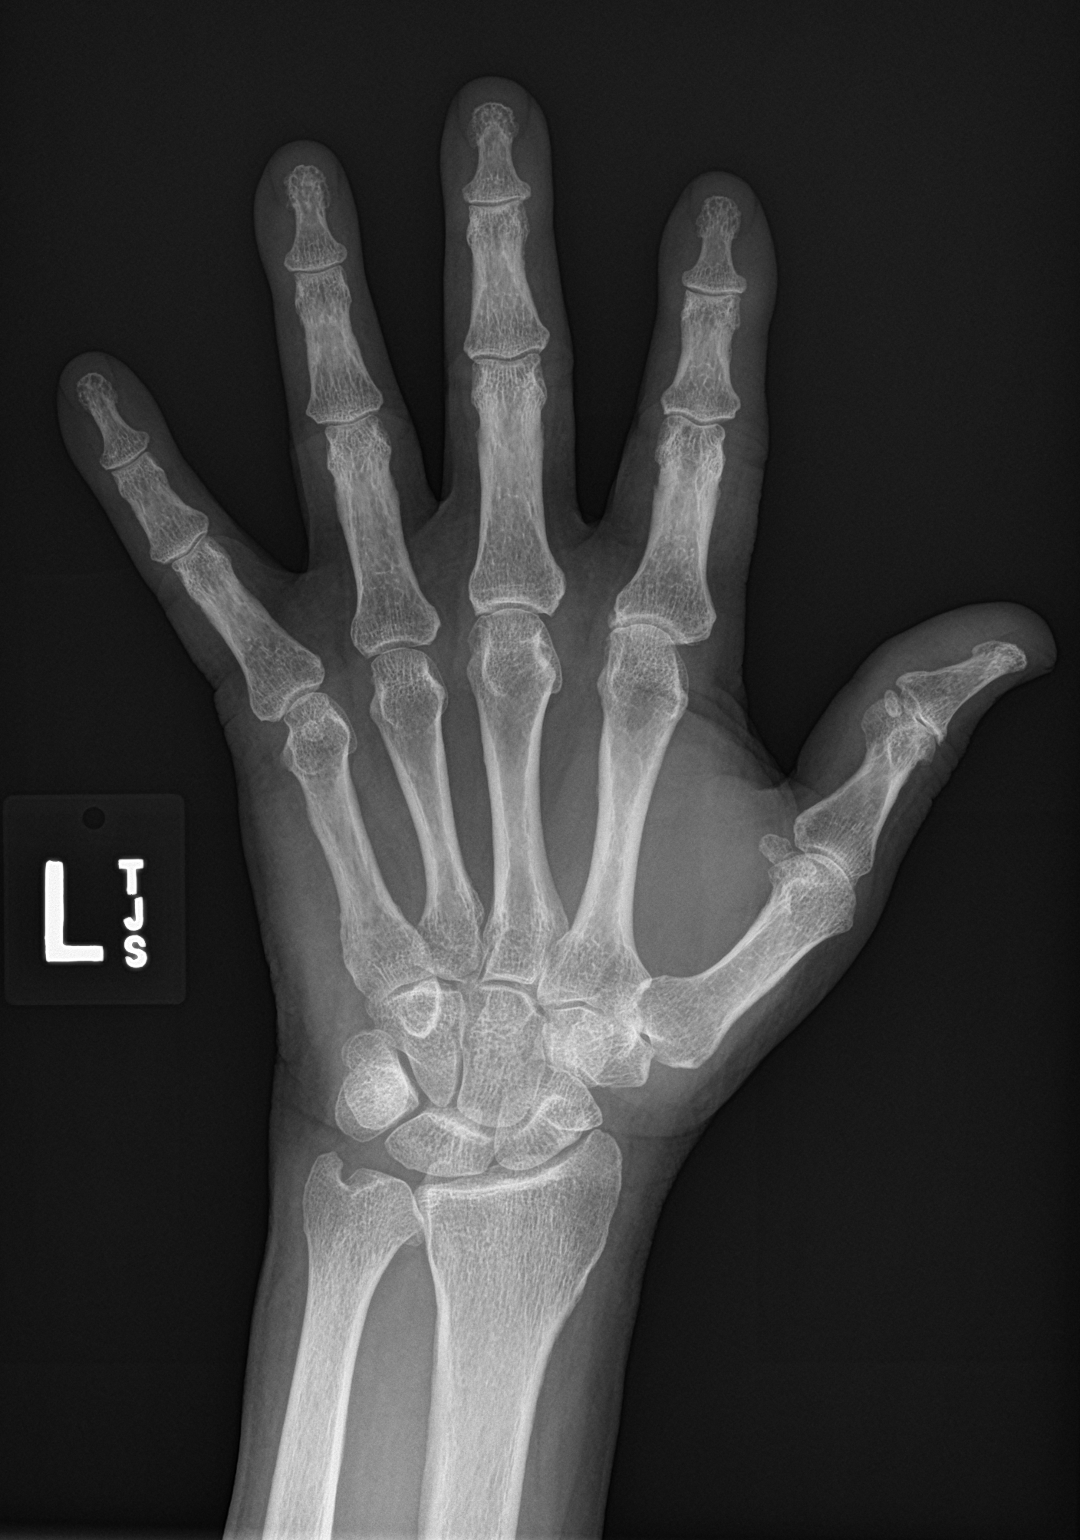
[im 2/3]
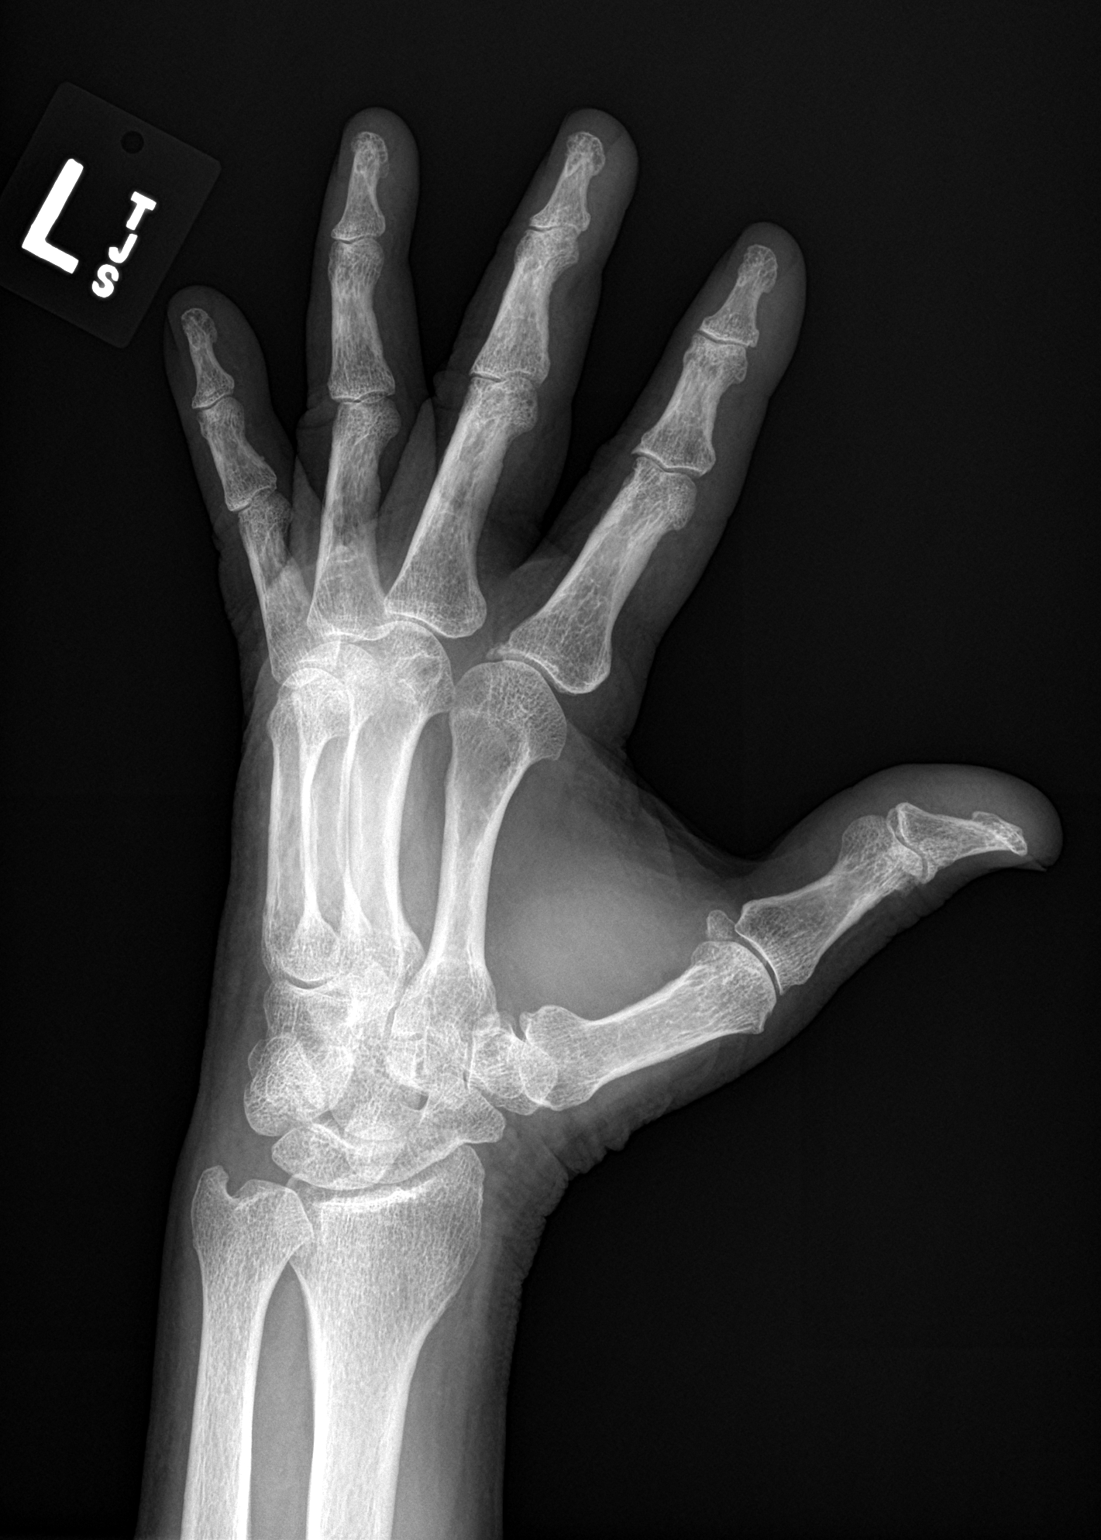
[im 3/3]
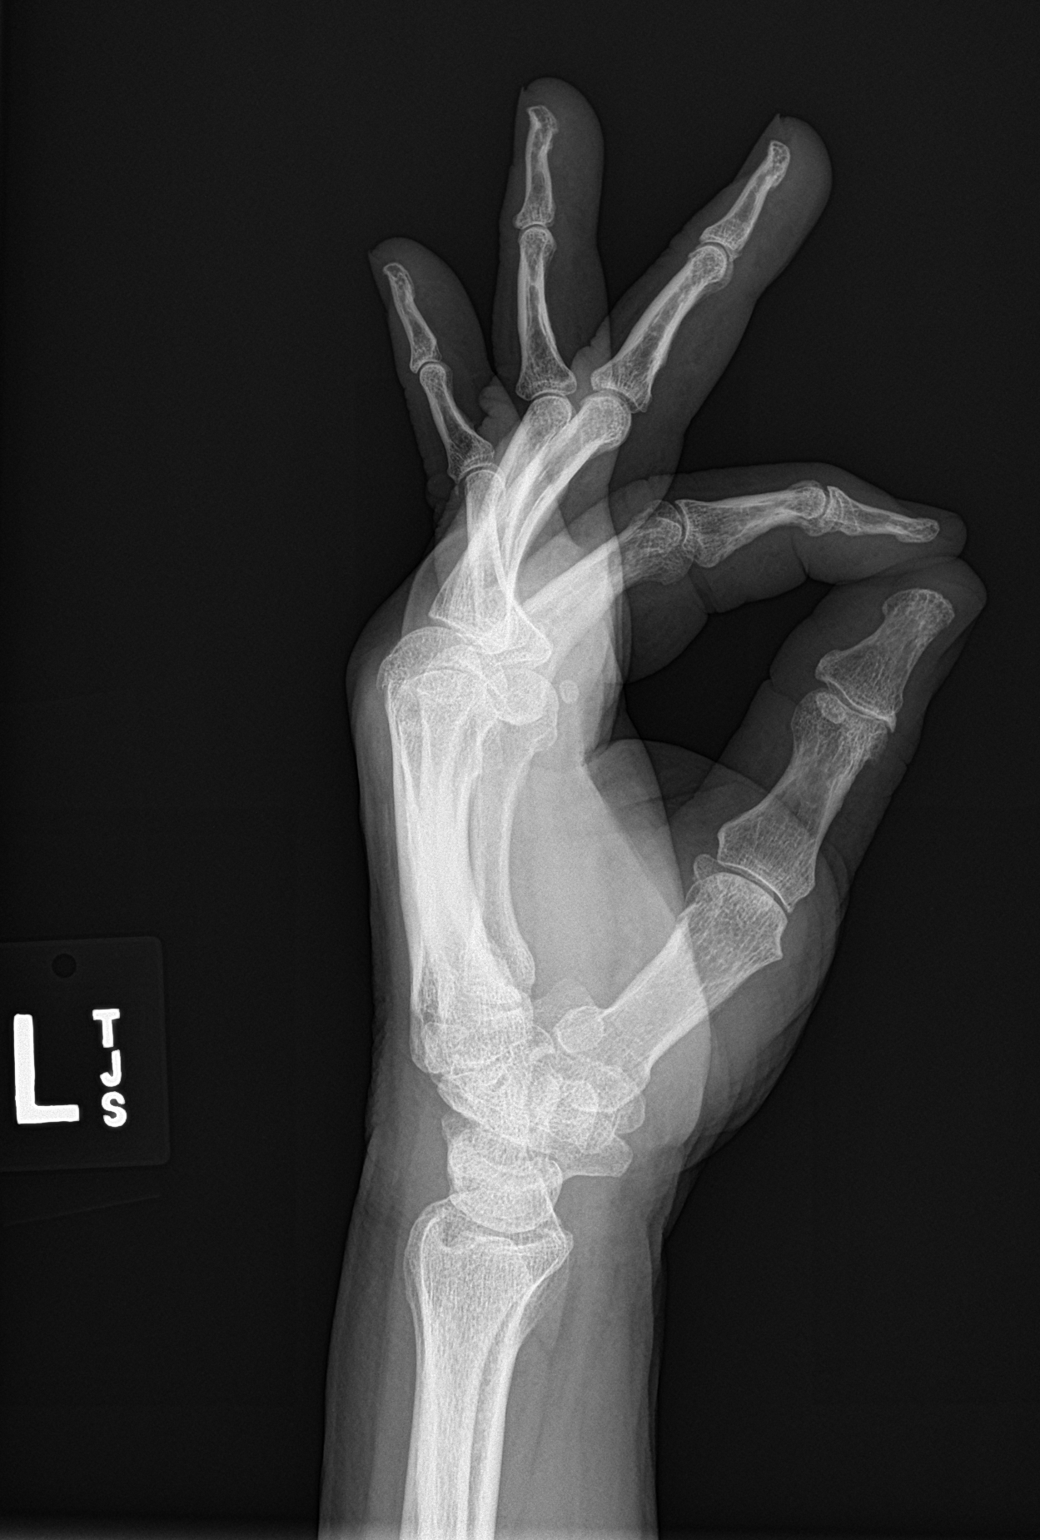

[3 of 3 positions shown; findings below may reference images not displayed]

FINDINGS: Minimal joint space loss and spurring of the interphalangeal joint
of the thumb. Minimal spurring of the distal interphalangeal joint
of the third digit. Minimal spurring of the second and third
metacarpophalangeal joints. Mild spurring of the distal radioulnar
joint. Soft tissues are unremarkable.
IMPRESSION: Minimal to mild degenerative changes of the left hand.

## 2022-12-07 IMAGING — CR DG HAND COMPLETE 3+V*R*
1 series · 3 of 3 positions shown · non-contrast
Comparison: None.

CLINICAL DATA: Ongoing right hand pain, right worse than left.
Technologist note reports primary osteoarthritis of bilateral hands.
Clinical concern for joint space loss.

EXAM:
RIGHT HAND - COMPLETE 3+ VIEW

[Series 1: dg hand complete right · 0.14mm/px · 3 of 3 slices shown]
[im 1/3]
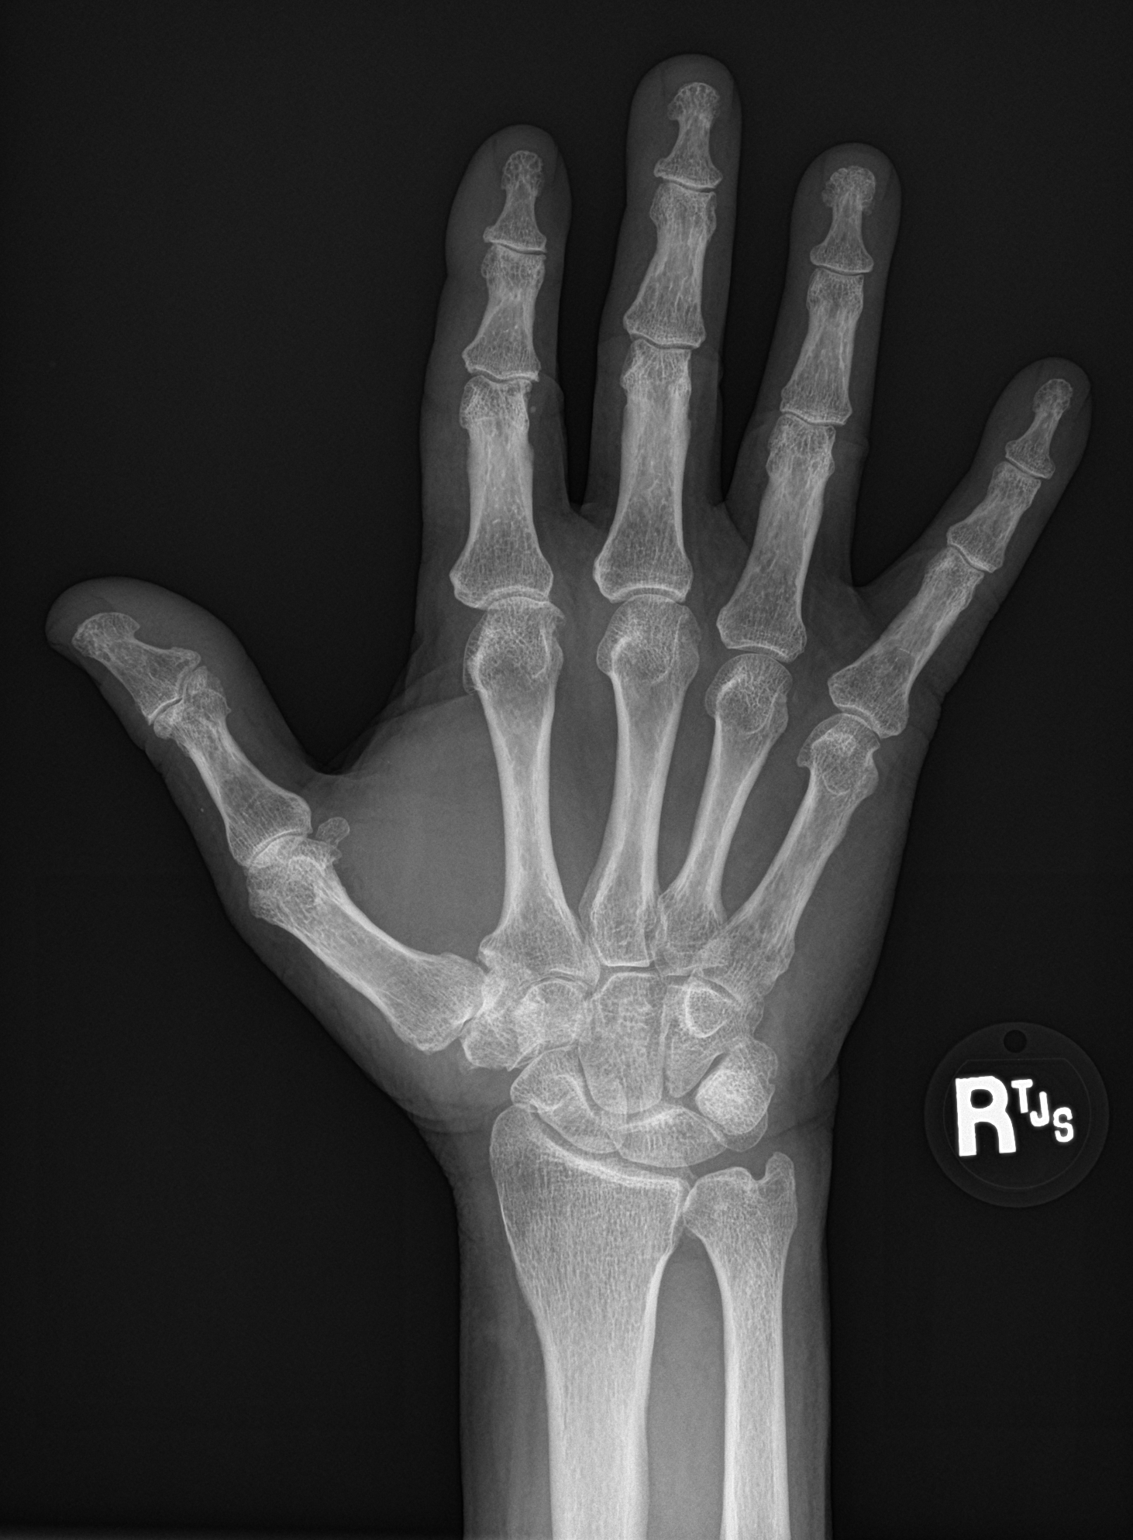
[im 2/3]
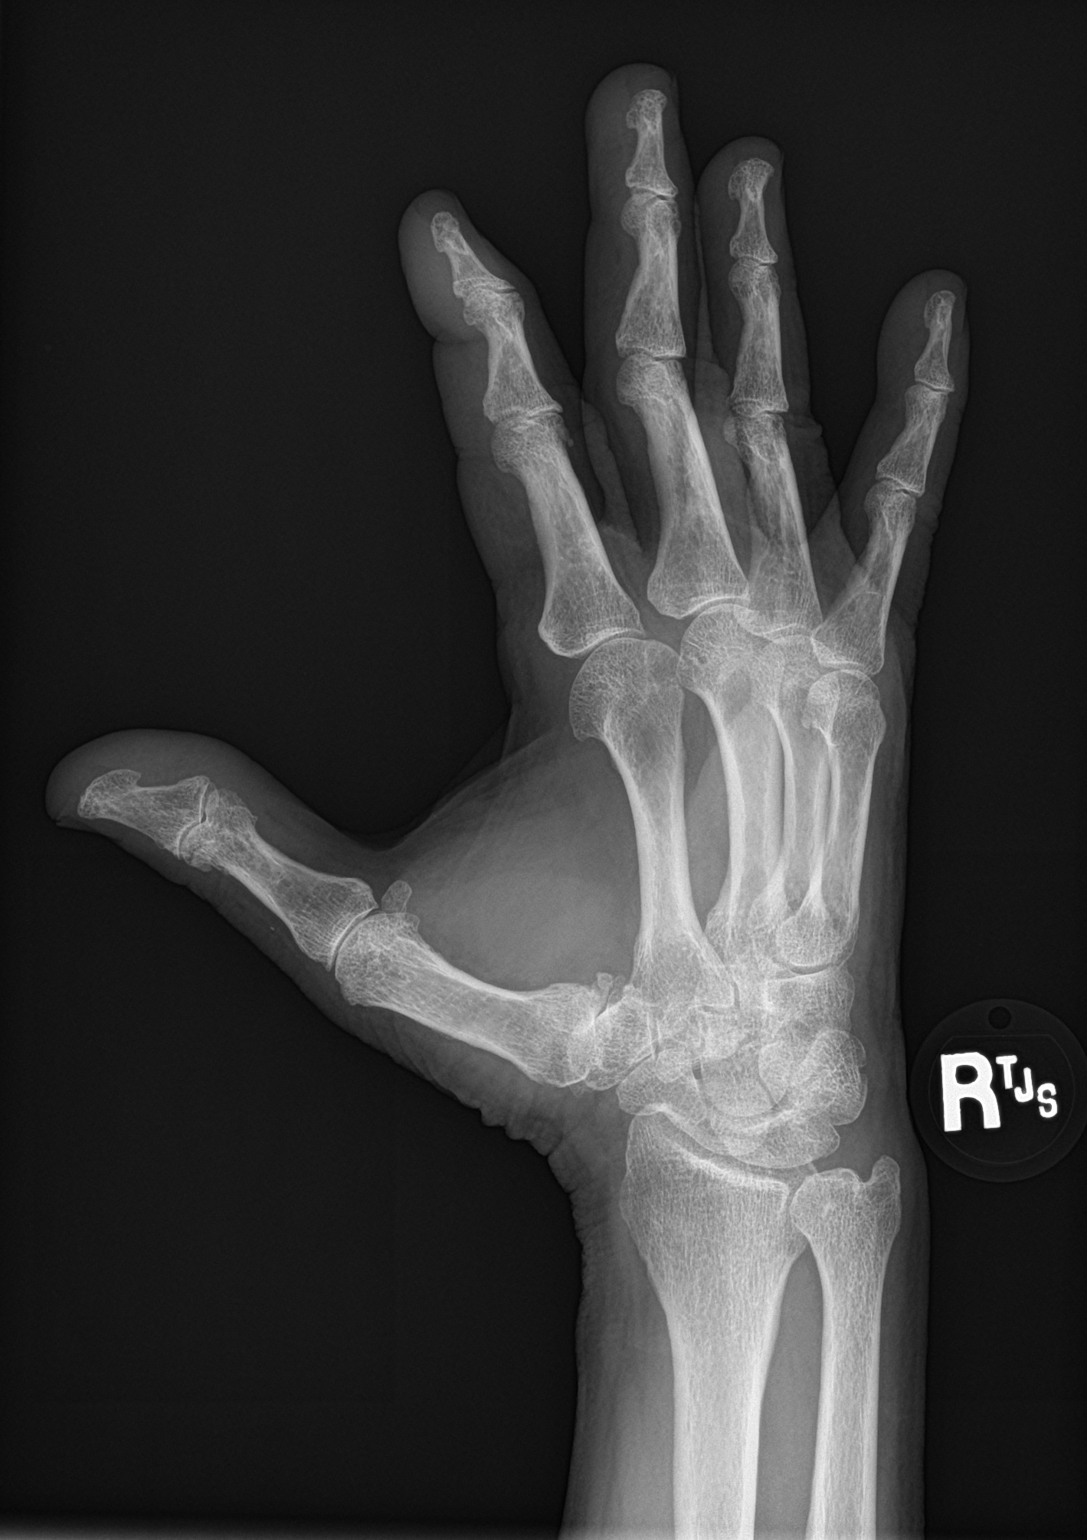
[im 3/3]
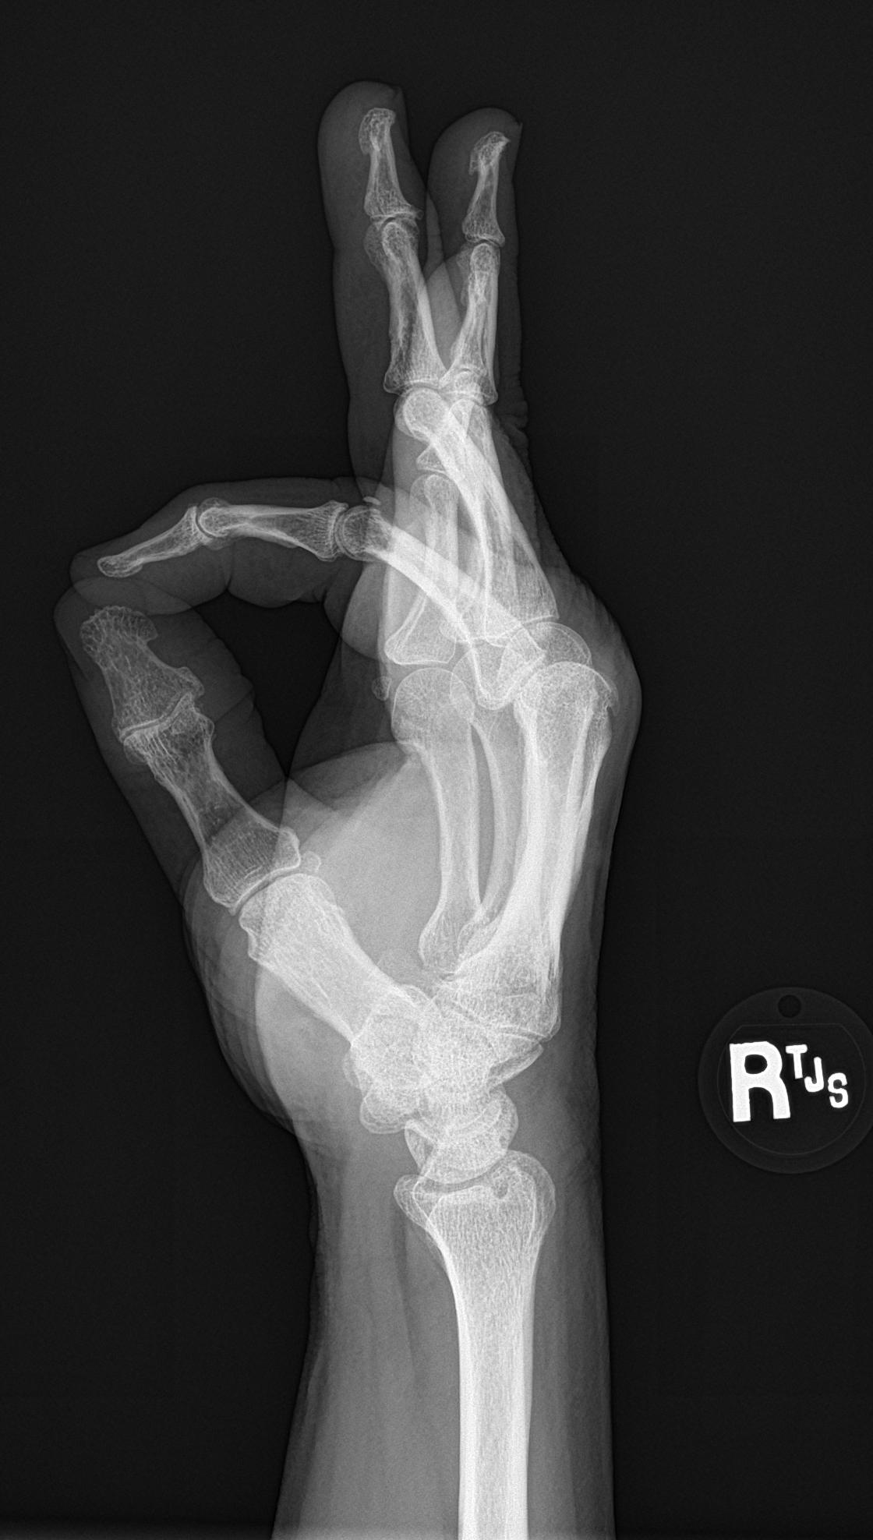

[3 of 3 positions shown; findings below may reference images not displayed]

FINDINGS: Mild joint space loss and spurring of the first carpometacarpal
joint. Mild spurring the second metacarpophalangeal joint. Mild
joint space loss and spurring seen at the distal interphalangeal
joints of the second, third, and fourth digits.
IMPRESSION: Mild multi joint degenerative changes of the right hand.

## 2022-12-17 DIAGNOSIS — I359 Nonrheumatic aortic valve disorder, unspecified: Secondary | ICD-10-CM | POA: Insufficient documentation

## 2022-12-17 NOTE — Patient Instructions (Signed)

## 2022-12-22 ENCOUNTER — Ambulatory Visit (INDEPENDENT_AMBULATORY_CARE_PROVIDER_SITE_OTHER): Payer: 59 | Admitting: Nurse Practitioner

## 2022-12-22 ENCOUNTER — Encounter: Payer: Self-pay | Admitting: Nurse Practitioner

## 2022-12-22 VITALS — BP 138/60 | HR 66 | Temp 98.0°F | Ht 68.5 in | Wt 202.8 lb

## 2022-12-22 DIAGNOSIS — I359 Nonrheumatic aortic valve disorder, unspecified: Secondary | ICD-10-CM | POA: Diagnosis not present

## 2022-12-22 DIAGNOSIS — I700 Atherosclerosis of aorta: Secondary | ICD-10-CM

## 2022-12-22 DIAGNOSIS — J432 Centrilobular emphysema: Secondary | ICD-10-CM | POA: Diagnosis not present

## 2022-12-22 DIAGNOSIS — G2581 Restless legs syndrome: Secondary | ICD-10-CM

## 2022-12-22 DIAGNOSIS — N281 Cyst of kidney, acquired: Secondary | ICD-10-CM

## 2022-12-22 DIAGNOSIS — I10 Essential (primary) hypertension: Secondary | ICD-10-CM | POA: Diagnosis not present

## 2022-12-22 DIAGNOSIS — K760 Fatty (change of) liver, not elsewhere classified: Secondary | ICD-10-CM

## 2022-12-22 DIAGNOSIS — E7800 Pure hypercholesterolemia, unspecified: Secondary | ICD-10-CM

## 2022-12-22 DIAGNOSIS — M1A0720 Idiopathic chronic gout, left ankle and foot, without tophus (tophi): Secondary | ICD-10-CM

## 2022-12-22 DIAGNOSIS — E038 Other specified hypothyroidism: Secondary | ICD-10-CM

## 2022-12-22 DIAGNOSIS — N400 Enlarged prostate without lower urinary tract symptoms: Secondary | ICD-10-CM

## 2022-12-22 NOTE — Assessment & Plan Note (Signed)
Ongoing on imaging.  Continue Lipitor and ASA daily for prevention.  Continue complete cessation of smoking.  Continue yearly lung CT scans.

## 2022-12-22 NOTE — Assessment & Plan Note (Signed)
Chronic, ongoing.  Diagnosed 08/05/21.  Continue current medication regimen and adjust as needed.  Thyroid labs up to date and stable.

## 2022-12-22 NOTE — Assessment & Plan Note (Addendum)
Chronic, ongoing.  Continue current medication regimen and adjust as needed.  Lipid panel today, fasting.  Refills sent up to date.

## 2022-12-22 NOTE — Assessment & Plan Note (Signed)
Chronic, stable with no use of Albuterol.  Spirometry September 2023 FEV1 94% and FEV1/FVC 118%.  Continue current regimen and adjust as needed.  Continue annual CT lung screens until age 74.  Recommend continue cessation of smoking.  Return in 6 months.

## 2022-12-22 NOTE — Assessment & Plan Note (Signed)
Noted on lung CT screening on 08/10/22 -- will obtain Echo.  Discussed with patient and educated on this.  No current symptoms.

## 2022-12-22 NOTE — Assessment & Plan Note (Signed)
Chronic, stable with Allopurinol 100 MG daily, can adjust dose as needed.  Recheck uric acid level at physical and treat flares as needed.  Recommend monitoring diet and continue to cut back on alcohol use.

## 2022-12-22 NOTE — Assessment & Plan Note (Signed)
Noted on CT lung screening and remains stable in size.  Monitor closely and refer to nephrology as needed.

## 2022-12-22 NOTE — Progress Notes (Signed)
BP 138/60   Pulse 66   Temp 98 F (36.7 C)   Ht 5' 8.5" (1.74 m)   Wt 202 lb 12.8 oz (92 kg)   SpO2 99%   BMI 30.38 kg/m    Subjective:    Patient ID: William Murray, male    DOB: [birth year], 72 y.o.   MRN: [id number]  HPI: William Murray is a 72 y.o. male  Chief Complaint  Patient presents with   Hypertension   Hyperlipidemia   COPD   Benign Prostatic Hypertrophy   Thyroid   HYPERTENSION Taking Losartan 100 MG, Metoprolol XL 25 MG daily, Amlodipine 5 MG daily, and Atorvastatin 10 MG daily. Reports taking one BP medication in past that caused cough, suspect an ACE.  On recent CT Lung screening there was noted to be calcifications on the aortic valve and recommend echo for correlation.   Had visit with Dr. Rockey Situ 07/20/20, had Zio due to PVCs and was started on Metoprolol during that time, is to return as needed. Hypertension status: stable  Satisfied with current treatment? yes Duration of hypertension: chronic BP monitoring frequency:  rarely BP range: 130/70 range BP medication side effects:  no Medication compliance: good compliance Previous BP meds: Losartan and HCTZ Aspirin: yes Recurrent headaches: no Visual changes: no Palpitations: no Dyspnea: no Chest pain: no Lower extremity edema: occasional at baseline Dizzy/lightheaded: no The ASCVD Risk score (Arnett DK, et al., 2019) failed to calculate for the following reasons:   The valid total cholesterol range is 130 to 320 mg/dL   COPD Obtains lung cancer screening yearly.  Noting mild centrilobular emphysema and aortic atherosclerosis.  There is also noted to be a right kidney cyst which has remained stable in size, recently 3.9 cm -- smaller then previous.    Currently only use Albuterol as needed, has not had to use this year.  Has history of smoking, quit several years ago -- smoked about 20 years heavily.   COPD status: controlled Satisfied with current treatment?: yes Oxygen use: no Dyspnea  frequency:  none Cough frequency: none Rescue inhaler frequency:  none Limitation of activity: no Productive cough:  none Last Spirometry: 06/26/22 FEV1 95% and FEV1/FVC 118%  Pneumovax: Up to Date Influenza: Up to Date   HYPOTHYROIDISM Continues on Levothyroxine 50 MCG daily.  Taking Allopurinol 100 MG daily for history of uric acid elevation with joint pain -- this has offered some benefit.   Thyroid control status:stable Satisfied with current treatment? yes Medication side effects: no Medication compliance: good compliance Etiology of hypothyroidism: unknown Recent dose adjustment:no Fatigue: no Cold intolerance: no Heat intolerance: no Weight gain: no Weight loss: no Constipation: no Diarrhea/loose stools: no Palpitations: no Lower extremity edema: no Anxiety/depressed mood: no   BPH Continues on Flomax 0.4 MG daily.   BPH status: stable Satisfied with current treatment?: yes Medication side effects: no Medication compliance: good compliance Duration: chronic Nocturia: 1/night Urinary frequency:no Incomplete voiding: no Urgency: no Weak urinary stream: no Straining to start stream: no Dysuria: no Onset: gradual Severity: mild Alleviating factors: Flomax Aggravating factors: unknown Treatments attempted: Flomax IPSS Questionnaire (AUA-7): Over the past month.   1)  How often have you had a sensation of not emptying your bladder completely after you finish urinating?  0 - Not at all  2)  How often have you had to urinate again less than two hours after you finished urinating? 0 - Not at all  3)  How often have you found  you stopped and started again several times when you urinated?  0 - Not at all  4) How difficult have you found it to postpone urination?  0 - Not at all  5) How often have you had a weak urinary stream?  0 - Not at all  6) How often have you had to push or strain to begin urination?  0 - Not at all  7) How many times did you most typically get  up to urinate from the time you went to bed until the time you got up in the morning?  1 - 1 time  Total score:  0-7 mildly symptomatic   8-19 moderately symptomatic   20-35 severely symptomatic     Relevant past medical, surgical, family and social history reviewed and updated as indicated. Interim medical history since our last visit reviewed. Allergies and medications reviewed and updated.  Review of Systems  Constitutional:  Negative for activity change, diaphoresis, fatigue and fever.  Respiratory:  Negative for cough, chest tightness, shortness of breath and wheezing.   Cardiovascular:  Negative for chest pain, palpitations and leg swelling.  Gastrointestinal: Negative.   Musculoskeletal:  Negative for arthralgias and joint swelling.  Neurological: Negative.   Psychiatric/Behavioral: Negative.      Per HPI unless specifically indicated above     Objective:    BP 138/60   Pulse 66   Temp 98 F (36.7 C)   Ht 5' 8.5" (1.74 m)   Wt 202 lb 12.8 oz (92 kg)   SpO2 99%   BMI 30.38 kg/m   Wt Readings from Last 3 Encounters:  12/22/22 202 lb 12.8 oz (92 kg)  06/27/22 196 lb 9.6 oz (89.2 kg)  06/27/22 196 lb 9.6 oz (89.2 kg)    Physical Exam Vitals and nursing note reviewed.  Constitutional:      General: He is awake. He is not in acute distress.    Appearance: He is well-developed and well-groomed. He is obese. He is not ill-appearing or toxic-appearing.  HENT:     Head: Normocephalic.     Right Ear: Hearing and external ear normal.     Left Ear: Hearing and external ear normal.  Eyes:     General: Lids are normal.     Extraocular Movements: Extraocular movements intact.     Conjunctiva/sclera: Conjunctivae normal.  Neck:     Thyroid: No thyromegaly.     Vascular: No carotid bruit.  Cardiovascular:     Rate and Rhythm: Normal rate and regular rhythm.     Heart sounds: Normal heart sounds.  Pulmonary:     Effort: No accessory muscle usage or respiratory distress.      Breath sounds: Normal breath sounds.  Abdominal:     General: Bowel sounds are normal. There is no distension.     Palpations: Abdomen is soft.     Tenderness: There is no abdominal tenderness.  Musculoskeletal:     Cervical back: Full passive range of motion without pain.     Right lower leg: Edema (trace) present.     Left lower leg: Edema (trace) present.  Lymphadenopathy:     Cervical: No cervical adenopathy.  Skin:    General: Skin is warm.     Capillary Refill: Capillary refill takes less than 2 seconds.  Neurological:     Mental Status: He is alert and oriented to person, place, and time.     Deep Tendon Reflexes: Reflexes are normal and symmetric.  Reflex Scores:      Brachioradialis reflexes are 2+ on the right side and 2+ on the left side.      Patellar reflexes are 2+ on the right side and 2+ on the left side. Psychiatric:        Attention and Perception: Attention normal.        Mood and Affect: Mood normal.        Speech: Speech normal.        Behavior: Behavior normal. Behavior is cooperative.        Thought Content: Thought content normal.    Results for orders placed or performed in visit on 06/27/22  CBC with Differential/Platelet  Result Value Ref Range   WBC 7.9 3.4 - 10.8 x10E3/uL   RBC 4.54 4.14 - 5.80 x10E6/uL   Hemoglobin 15.3 13.0 - 17.7 g/dL   Hematocrit 44.4 37.5 - 51.0 %   MCV 98 (H) 79 - 97 fL   MCH 33.7 (H) 26.6 - 33.0 pg   MCHC 34.5 31.5 - 35.7 g/dL   RDW 12.7 11.6 - 15.4 %   Platelets 178 150 - 450 x10E3/uL   Neutrophils 55 Not Estab. %   Lymphs 34 Not Estab. %   Monocytes 8 Not Estab. %   Eos 2 Not Estab. %   Basos 1 Not Estab. %   Neutrophils Absolute 4.5 1.4 - 7.0 x10E3/uL   Lymphocytes Absolute 2.7 0.7 - 3.1 x10E3/uL   Monocytes Absolute 0.6 0.1 - 0.9 x10E3/uL   EOS (ABSOLUTE) 0.1 0.0 - 0.4 x10E3/uL   Basophils Absolute 0.0 0.0 - 0.2 x10E3/uL   Immature Granulocytes 0 Not Estab. %   Immature Grans (Abs) 0.0 0.0 - 0.1  x10E3/uL  Comprehensive metabolic panel  Result Value Ref Range   Glucose 100 (H) 70 - 99 mg/dL   BUN 12 8 - 27 mg/dL   Creatinine, Ser 0.91 0.76 - 1.27 mg/dL   eGFR 90 >59 mL/min/1.73   BUN/Creatinine Ratio 13 10 - 24   Sodium 139 134 - 144 mmol/L   Potassium 3.9 3.5 - 5.2 mmol/L   Chloride 103 96 - 106 mmol/L   CO2 20 20 - 29 mmol/L   Calcium 9.4 8.6 - 10.2 mg/dL   Total Protein 7.4 6.0 - 8.5 g/dL   Albumin 4.6 3.8 - 4.8 g/dL   Globulin, Total 2.8 1.5 - 4.5 g/dL   Albumin/Globulin Ratio 1.6 1.2 - 2.2   Bilirubin Total 0.9 0.0 - 1.2 mg/dL   Alkaline Phosphatase 93 44 - 121 IU/L   AST 33 0 - 40 IU/L   ALT 15 0 - 44 IU/L  Lipid Panel w/o Chol/HDL Ratio  Result Value Ref Range   Cholesterol, Total 110 100 - 199 mg/dL   Triglycerides 227 (H) 0 - 149 mg/dL   HDL 36 (L) >39 mg/dL   VLDL Cholesterol Cal 36 5 - 40 mg/dL   LDL Chol Calc (NIH) 38 0 - 99 mg/dL  TSH  Result Value Ref Range   TSH 2.420 0.450 - 4.500 uIU/mL  T4, free  Result Value Ref Range   Free T4 1.51 0.82 - 1.77 ng/dL  PSA  Result Value Ref Range   Prostate Specific Ag, Serum 1.0 0.0 - 4.0 ng/mL  Uric acid  Result Value Ref Range   Uric Acid 5.1 3.8 - 8.4 mg/dL  Magnesium  Result Value Ref Range   Magnesium 2.0 1.6 - 2.3 mg/dL      Assessment & Plan:  Problem List Items Addressed This Visit       Cardiovascular and Mediastinum   Aortic atherosclerosis ([facility])    Ongoing on imaging.  Continue Lipitor and ASA daily for prevention.  Continue complete cessation of smoking.  Continue yearly lung CT scans.      Relevant Orders   Comprehensive metabolic panel   Lipid Panel w/o Chol/HDL Ratio   Aortic valve calcification    Noted on lung CT screening on 08/10/22 -- will obtain Echo.  Discussed with patient and educated on this.  No current symptoms.      Relevant Orders   ECHOCARDIOGRAM COMPLETE   Essential hypertension, benign    Chronic, stable.  BP at goal for age in office and at home.  Recommend he  monitor BP at least a few mornings a week at home and document.  DASH diet at home.  Continue current medication regimen and adjust as needed.  Labs today: CMP, Lipid.  Refills up to date.  Return in 6 months.       Relevant Orders   Comprehensive metabolic panel   ECHOCARDIOGRAM COMPLETE     Respiratory   Centrilobular emphysema ([facility]CC) - Primary    Chronic, stable with no use of Albuterol.  Spirometry September 2023 FEV1 94% and FEV1/FVC 118%.  Continue current regimen and adjust as needed.  Continue annual CT lung screens until age 59.  Recommend continue cessation of smoking.  Return in 6 months.        Endocrine   Subclinical hypothyroidism    Chronic, ongoing.  Diagnosed 08/05/21.  Continue current medication regimen and adjust as needed.  Thyroid labs up to date and stable.        Genitourinary   BPH (benign prostatic hyperplasia)    Chronic, stable with AUA = 1.  Continue current medication regimen and adjust as needed.  Refills up to date.      Renal cyst, right    Noted on CT lung screening and remains stable in size.  Monitor closely and refer to nephrology as needed.        Other   Chronic gout    Chronic, stable with Allopurinol 100 MG daily, can adjust dose as needed.  Recheck uric acid level at physical and treat flares as needed.  Recommend monitoring diet and continue to cut back on alcohol use.      Hypercholesteremia    Chronic, ongoing.  Continue current medication regimen and adjust as needed.  Lipid panel today, fasting.  Refills sent up to date.      Relevant Orders   Comprehensive metabolic panel   Lipid Panel w/o Chol/HDL Ratio     Follow up plan: Return in about 27 weeks (around 06/29/2023) for Annual physical after 06/28/23 -- need spirometry.

## 2022-12-22 NOTE — Assessment & Plan Note (Signed)
Chronic, stable.  BP at goal for age in office and at home.  Recommend he monitor BP at least a few mornings a week at home and document.  DASH diet at home.  Continue current medication regimen and adjust as needed.  Labs today: CMP, Lipid.  Refills up to date.  Return in 6 months.

## 2022-12-22 NOTE — Assessment & Plan Note (Addendum)
Chronic, stable with AUA = 1.  Continue current medication regimen and adjust as needed.  Refills up to date.

## 2022-12-23 LAB — COMPREHENSIVE METABOLIC PANEL
ALT: 18 IU/L (ref 0–44)
AST: 28 IU/L (ref 0–40)
Albumin/Globulin Ratio: 1.7 (ref 1.2–2.2)
Albumin: 4.7 g/dL (ref 3.8–4.8)
Alkaline Phosphatase: 91 IU/L (ref 44–121)
BUN/Creatinine Ratio: 20 (ref 10–24)
BUN: 19 mg/dL (ref 8–27)
Bilirubin Total: 1.1 mg/dL (ref 0.0–1.2)
CO2: 20 mmol/L (ref 20–29)
Calcium: 9.3 mg/dL (ref 8.6–10.2)
Chloride: 105 mmol/L (ref 96–106)
Creatinine, Ser: 0.93 mg/dL (ref 0.76–1.27)
Globulin, Total: 2.8 g/dL (ref 1.5–4.5)
Glucose: 105 mg/dL — ABNORMAL HIGH (ref 70–99)
Potassium: 4.1 mmol/L (ref 3.5–5.2)
Sodium: 140 mmol/L (ref 134–144)
Total Protein: 7.5 g/dL (ref 6.0–8.5)
eGFR: 87 mL/min/{1.73_m2} (ref >59)

## 2022-12-23 LAB — LIPID PANEL W/O CHOL/HDL RATIO
Cholesterol, Total: 106 mg/dL (ref 100–199)
HDL: 42 mg/dL (ref >39)
LDL Chol Calc (NIH): 36 mg/dL (ref 0–99)
Triglycerides: 168 mg/dL — ABNORMAL HIGH (ref 0–149)
VLDL Cholesterol Cal: 28 mg/dL (ref 5–40)

## 2022-12-25 ENCOUNTER — Telehealth: Payer: Self-pay | Admitting: Nurse Practitioner

## 2022-12-25 NOTE — Telephone Encounter (Signed)
Pt. Given labs and instructions. Verbalizes understanding. 

## 2022-12-26 ENCOUNTER — Ambulatory Visit: Payer: Medicare Other | Admitting: Nurse Practitioner

## 2023-02-12 ENCOUNTER — Ambulatory Visit
Admission: RE | Admit: 2023-02-12 | Discharge: 2023-02-12 | Disposition: A | Discharge status: Home / Self Care | Payer: 59 | Source: Ambulatory Visit | Attending: Nurse Practitioner | Admitting: Nurse Practitioner

## 2023-02-12 DIAGNOSIS — E785 Hyperlipidemia, unspecified: Secondary | ICD-10-CM | POA: Diagnosis not present

## 2023-02-12 DIAGNOSIS — I359 Nonrheumatic aortic valve disorder, unspecified: Secondary | ICD-10-CM

## 2023-02-12 DIAGNOSIS — I350 Nonrheumatic aortic (valve) stenosis: Secondary | ICD-10-CM

## 2023-02-12 DIAGNOSIS — I10 Essential (primary) hypertension: Secondary | ICD-10-CM | POA: Diagnosis not present

## 2023-02-12 DIAGNOSIS — I083 Combined rheumatic disorders of mitral, aortic and tricuspid valves: Secondary | ICD-10-CM | POA: Insufficient documentation

## 2023-02-12 DIAGNOSIS — I351 Nonrheumatic aortic (valve) insufficiency: Secondary | ICD-10-CM

## 2023-02-12 DIAGNOSIS — J449 Chronic obstructive pulmonary disease, unspecified: Secondary | ICD-10-CM | POA: Diagnosis not present

## 2023-02-12 DIAGNOSIS — I2510 Atherosclerotic heart disease of native coronary artery without angina pectoris: Secondary | ICD-10-CM | POA: Insufficient documentation

## 2023-02-12 DIAGNOSIS — F17200 Nicotine dependence, unspecified, uncomplicated: Secondary | ICD-10-CM | POA: Insufficient documentation

## 2023-02-12 LAB — ECHOCARDIOGRAM COMPLETE
AR max vel: 1.67 cm2
AV Area VTI: 1.84 cm2
AV Area mean vel: 1.8 cm2
AV Mean grad: 8 mmHg
AV Peak grad: 18 mmHg
Ao pk vel: 2.12 m/s
Area-P 1/2: 2.32 cm2
MV VTI: 2.53 cm2
P 1/2 time: 495 msec
S' Lateral: 3.7 cm

## 2023-02-12 NOTE — Progress Notes (Signed)
Good afternoon, please let William Murray know his echo has returned and overall is stable.  The pump of heart has good levels.  The aortic valve, which was of concern on recent chest CT, shows mild stenosis -- meaning stiffening, which we can monitor.  There is also some mild regurgitation, meaning back flow, of blood from the mitral and aortic valves.  We do not need to do anything for this at this time.  If you ever start having any increased shortness of breath or chest pain, then we would recheck and see if any worsening valve structure.  Any questions? Keep being amazing!!  Thank you for allowing me to participate in your care.  I appreciate you. Kindest regards, Ashford Clouse

## 2023-02-12 NOTE — Progress Notes (Signed)
*  PRELIMINARY RESULTS* Echocardiogram 2D Echocardiogram has been performed.  Carolyne Fiscal 02/12/2023, 9:44 AM

## 2023-02-13 ENCOUNTER — Telehealth: Payer: Self-pay

## 2023-02-13 NOTE — Telephone Encounter (Signed)
Copied from CRM [id number]. Topic: General - Other >> Feb 13, 2023  3:45 PM Dondra Prader E wrote: Reason for CRM: Pt called reporting that he missed a call from the office and no message was left.

## 2023-02-28 ENCOUNTER — Encounter: Payer: Self-pay | Admitting: Nurse Practitioner

## 2023-02-28 ENCOUNTER — Ambulatory Visit (INDEPENDENT_AMBULATORY_CARE_PROVIDER_SITE_OTHER): Payer: 59 | Admitting: Nurse Practitioner

## 2023-02-28 VITALS — BP 134/62 | HR 74 | Temp 97.6°F | Ht 68.5 in | Wt 200.2 lb

## 2023-02-28 DIAGNOSIS — R5383 Other fatigue: Secondary | ICD-10-CM

## 2023-02-28 DIAGNOSIS — R002 Palpitations: Secondary | ICD-10-CM | POA: Diagnosis not present

## 2023-02-28 DIAGNOSIS — I359 Nonrheumatic aortic valve disorder, unspecified: Secondary | ICD-10-CM

## 2023-02-28 DIAGNOSIS — E038 Other specified hypothyroidism: Secondary | ICD-10-CM | POA: Diagnosis not present

## 2023-02-28 NOTE — Assessment & Plan Note (Addendum)
New onset over past few weeks with intermittent chest pain with dizziness -- ectopic beats noted on EKG today.  History of following with cardiology for similar.  Urgent referral to return to cardiology due to current symptoms.  Labs today: CBC, CMP, TSH, BNP.  Educated patient on plan of care.  He is aware if any worsening symptoms to immediately go to ER -- we discussed red flag symptoms.  Recommend increased water intake and cut back on caffeine use.

## 2023-02-28 NOTE — Assessment & Plan Note (Signed)
Chronic, ongoing.  Diagnosed 08/05/21.  Continue current medication regimen and adjust as needed.  Thyroid labs today due to symptoms.

## 2023-02-28 NOTE — Progress Notes (Signed)
BP 134/62 (BP Location: Left Arm, Patient Position: Sitting, Cuff Size: Normal)   Pulse 74   Temp 97.6 F (36.4 C) (Oral)   Ht 5' 8.5" (1.74 m)   Wt 200 lb 3.2 oz (90.8 kg)   SpO2 96%   BMI 29.99 kg/m    Subjective:    Patient ID: William Murray, male    DOB: 12/27/1950, 72 y.o.   MRN: [id number]  HPI: William Murray is a 72 y.o. male  Chief Complaint  Patient presents with   Fatigue    Has been feeling very fatigued for the past 2 or 3 weeks after doing very little. Patient states that it normally take 15 mins to sweep his floor and recently it has been taking over an hour because he has to sit and rest.   Echocardiogram    Here for follow up   FATIGUE Has been very fatigued for 3 weeks, will do a simple task and get worn out (exhausted).  Can not work in garden, has to sit and rest a lot.  Has underlying HTN and hypothyroid.  Takes medications for these.  He reports sometimes hearing/feeling his heart beat really fast when he is resting.  Notices palpitations more when is lying down.  Echo on 02/12/2023 did note mild aortic valve stenosis and regurgitation.  EF 55-60%.  History of smoking years ago, goes for annual lung screening with recent on 08/12/22 -- mild emphysema.  Recent spirometry reassuring.  Last saw cardiology 07/20/2020.   Duration:  weeks Severity: 8/10  Onset: sudden Context when symptoms started:  no recent illness or tick bites Symptoms improve with rest: no  Depressive symptoms: no Stress/anxiety: no Insomnia: has a good hard sleep 2-3 hours but then is awake on and off rest of night -- this has been the same since being a teenager Snoring: yes Observed apnea by bed partner: no Daytime hypersomnolence:no Wakes feeling refreshed: no History of sleep study: he thinks so Dysnea on exertion:   a little bit Orthopnea/PND: no Chest pain: yes -- over the past 2-3 weeks has noticed on occasion to left chest, lasts a few seconds, sharp -- gets dizzy with  this, denies diaphoresis, but does have SOB Chronic cough: no Lower extremity edema: no Arthralgias:no Myalgias: no Weakness: no Rash: no     02/28/2023   10:26 AM 12/22/2022    8:40 AM 06/27/2022    8:44 AM 12/21/2021    8:57 AM 06/25/2021    9:57 AM  Depression screen PHQ 2/9  Decreased Interest 1 0 0 0 0  Down, Depressed, Hopeless 0 0 0 0 0  PHQ - 2 Score 1 0 0 0 0  Altered sleeping 0 0 0 0   Tired, decreased energy 1 0 0 0   Change in appetite 0 0 0 0   Feeling bad or failure about yourself  0 0 0 0   Trouble concentrating 0 0 0 0   Moving slowly or fidgety/restless 0 0 0 0   Suicidal thoughts 0 0 0 0   PHQ-9 Score 2 0 0 0   Difficult doing work/chores Not difficult at all Not difficult at all Not difficult at all         02/28/2023   10:26 AM 12/22/2022    8:40 AM 06/27/2022    8:46 AM 12/21/2021    8:57 AM  GAD 7 : Generalized Anxiety Score  Nervous, Anxious, on Edge 0 0 0 0  Control/stop  worrying 0 0 0 0  Worry too much - different things 0 0 0 0  Trouble relaxing 0 0 0 0  Restless 0 0 0 0  Easily annoyed or irritable 0 0 0 0  Afraid - awful might happen 0 0 0 0  Total GAD 7 Score 0 0 0 0  Anxiety Difficulty Not difficult at all Not difficult at all Not difficult at all Not difficult at all      Relevant past medical, surgical, family and social history reviewed and updated as indicated. Interim medical history since our last visit reviewed. Allergies and medications reviewed and updated.  Review of Systems  Constitutional:  Positive for fatigue. Negative for activity change, diaphoresis and fever.  Respiratory:  Positive for shortness of breath. Negative for cough, chest tightness and wheezing.   Cardiovascular:  Positive for chest pain and palpitations. Negative for leg swelling.  Gastrointestinal: Negative.   Endocrine: Negative for cold intolerance, heat intolerance, polydipsia, polyphagia and polyuria.  Neurological:  Positive for dizziness. Negative for  tremors, seizures, syncope, weakness, light-headedness, numbness and headaches.  Psychiatric/Behavioral: Negative.      Per HPI unless specifically indicated above     Objective:    BP 134/62 (BP Location: Left Arm, Patient Position: Sitting, Cuff Size: Normal)   Pulse 74   Temp 97.6 F (36.4 C) (Oral)   Ht 5' 8.5" (1.74 m)   Wt 200 lb 3.2 oz (90.8 kg)   SpO2 96%   BMI 29.99 kg/m   Wt Readings from Last 3 Encounters:  02/28/23 200 lb 3.2 oz (90.8 kg)  12/22/22 202 lb 12.8 oz (92 kg)  06/27/22 196 lb 9.6 oz (89.2 kg)    Physical Exam Vitals and nursing note reviewed.  Constitutional:      General: He is awake. He is not in acute distress.    Appearance: He is well-developed and well-groomed. He is obese. He is not ill-appearing or toxic-appearing.  HENT:     Head: Normocephalic.     Right Ear: Hearing and external ear normal.     Left Ear: Hearing and external ear normal.  Eyes:     General: Lids are normal.     Extraocular Movements: Extraocular movements intact.     Conjunctiva/sclera: Conjunctivae normal.  Neck:     Thyroid: No thyromegaly.     Vascular: No carotid bruit.  Cardiovascular:     Rate and Rhythm: Normal rate. Rhythm irregular.     Heart sounds: Normal heart sounds.  Pulmonary:     Effort: No accessory muscle usage or respiratory distress.     Breath sounds: Normal breath sounds.  Abdominal:     General: Bowel sounds are normal. There is no distension.     Palpations: Abdomen is soft.     Tenderness: There is no abdominal tenderness.  Musculoskeletal:     Cervical back: Full passive range of motion without pain.     Right lower leg: Edema (trace) present.     Left lower leg: Edema (trace) present.  Lymphadenopathy:     Cervical: No cervical adenopathy.  Skin:    General: Skin is warm.     Capillary Refill: Capillary refill takes less than 2 seconds.  Neurological:     Mental Status: He is alert and oriented to person, place, and time.     Deep  Tendon Reflexes: Reflexes are normal and symmetric.     Reflex Scores:      Brachioradialis reflexes are 2+ on  the right side and 2+ on the left side.      Patellar reflexes are 2+ on the right side and 2+ on the left side. Psychiatric:        Attention and Perception: Attention normal.        Mood and Affect: Mood normal.        Speech: Speech normal.        Behavior: Behavior normal. Behavior is cooperative.        Thought Content: Thought content normal.    EKG My review and personal interpretation at Time: 1100 Indication: chest pain and palpitations  Rate: 62  Rhythm: sinus Axis: normal Other: No nonspecific st abn, no stemi, no lvh, ectopic beats present  Results for orders placed or performed during the hospital encounter of 02/12/23  ECHOCARDIOGRAM COMPLETE  Result Value Ref Range   Ao pk vel 2.12 m/s   AV Area VTI 1.84 cm2   AR max vel 1.67 cm2   AV Mean grad 8.0 mmHg   AV Peak grad 18.0 mmHg   S' Lateral 3.70 cm   AV Area mean vel 1.80 cm2   Area-P 1/2 2.32 cm2   P 1/2 time 495 msec   MV VTI 2.53 cm2   Est EF 55 - 60%       Assessment & Plan:   Problem List Items Addressed This Visit       Cardiovascular and Mediastinum   Aortic valve calcification    Noted on lung CT screening on 08/10/22 and mild aortic valve stenosis noted on echo.  Discussed with patient and educated on this.  Refer to palpitations plan of care for further.        Endocrine   Subclinical hypothyroidism    Chronic, ongoing.  Diagnosed 08/05/21.  Continue current medication regimen and adjust as needed.  Thyroid labs today due to symptoms.      Relevant Orders   TSH   T4, free     Other   Fatigue    Refer to palpitations plan of care.      Palpitations - Primary    New onset over past few weeks with intermittent chest pain with dizziness -- ectopic beats noted on EKG today.  History of following with cardiology for similar.  Urgent referral to return to cardiology due to current  symptoms.  Labs today: CBC, CMP, TSH, BNP.  Educated patient on plan of care.  He is aware if any worsening symptoms to immediately go to ER -- we discussed red flag symptoms.  Recommend increased water intake and cut back on caffeine use.      Relevant Orders   EKG 12-Lead (Completed)   Comprehensive metabolic panel   CBC with Differential/Platelet   B Nat Peptide   Ambulatory referral to Cardiology     Follow up plan: Return in about 3 weeks (around 03/21/2023) for Chet pain and Fatigue.

## 2023-02-28 NOTE — Patient Instructions (Signed)
Nonspecific Chest Pain Chest pain can be caused by many different conditions. Some causes of chest pain can be life-threatening. These will require treatment right away. Serious causes of chest pain include: Heart attack. A tear in the body's main blood vessel. Redness and swelling (inflammation) around your heart. Blood clot in your lungs. Other causes of chest pain may not be so serious. These include: Heartburn. Anxiety or stress. Damage to bones or muscles in your chest. Lung infections. Chest pain can feel like: Pain or discomfort in your chest. Crushing, pressure, aching, or squeezing pain. Burning or tingling. Dull or sharp pain that is worse when you move, cough, or take a deep breath. Pain or discomfort that is also felt in your back, neck, jaw, shoulder, or arm, or pain that spreads to any of these areas. It is hard to know whether your pain is caused by something that is serious or something that is not so serious. So it is important to see your doctor right away if you have chest pain. Follow these instructions at home: Medicines Take over-the-counter and prescription medicines only as told by your doctor. If you were prescribed an antibiotic medicine, take it as told by your doctor. Do not stop taking the antibiotic even if you start to feel better. Lifestyle  Rest as told by your doctor. Do not use any products that contain nicotine or tobacco, such as cigarettes, e-cigarettes, and chewing tobacco. If you need help quitting, ask your doctor. Do not drink alcohol. Make lifestyle changes as told by your doctor. These may include: Getting regular exercise. Ask your doctor what activities are safe for you. Eating a heart-healthy diet. A diet and nutrition specialist (dietitian) can help you to learn healthy eating options. Staying at a healthy weight. Treating diabetes or high blood pressure, if needed. Lowering your stress. Activities such as yoga and relaxation techniques  can help. General instructions Pay attention to any changes in your symptoms. Tell your doctor about them or any new symptoms. Avoid any activities that cause chest pain. Keep all follow-up visits as told by your doctor. This is important. You may need more testing if your chest pain does not go away. Contact a doctor if: Your chest pain does not go away. You feel depressed. You have a fever. Get help right away if: Your chest pain is worse. You have a cough that gets worse, or you cough up blood. You have very bad (severe) pain in your belly (abdomen). You pass out (faint). You have either of these for no clear reason: Sudden chest discomfort. Sudden discomfort in your arms, back, neck, or jaw. You have shortness of breath at any time. You suddenly start to sweat, or your skin gets clammy. You feel sick to your stomach (nauseous). You throw up (vomit). You suddenly feel lightheaded or dizzy. You feel very weak or tired. Your heart starts to beat fast, or it feels like it is skipping beats. These symptoms may be an emergency. Do not wait to see if the symptoms will go away. Get medical help right away. Call your local emergency services (911 in the [place] Do not drive yourself to the hospital. Summary Chest pain can be caused by many different conditions. The cause may be serious and need treatment right away. If you have chest pain, see your doctor right away. Follow your doctor's instructions for taking medicines and making lifestyle changes. Keep all follow-up visits as told by your doctor. This includes visits for any further   testing if your chest pain does not go away. Be sure to know the signs that show that your condition has become worse. Get help right away if you have these symptoms. This information is not intended to replace advice given to you by your health care provider. Make sure you discuss any questions you have with your health care provider. Document Revised:  08/10/2022 Document Reviewed: 08/10/2022 Elsevier Patient Education  2023 Elsevier Inc.  

## 2023-02-28 NOTE — Assessment & Plan Note (Signed)
Refer to palpitations plan of care. 

## 2023-02-28 NOTE — Assessment & Plan Note (Signed)
Noted on lung CT screening on 08/10/22 and mild aortic valve stenosis noted on echo.  Discussed with patient and educated on this.  Refer to palpitations plan of care for further.

## 2023-03-01 LAB — CBC WITH DIFFERENTIAL/PLATELET
Basophils Absolute: 0.1 10*3/uL (ref 0.0–0.2)
Basos: 1 %
EOS (ABSOLUTE): 0.1 10*3/uL (ref 0.0–0.4)
Eos: 1 %
Hematocrit: 46.3 % (ref 37.5–51.0)
Hemoglobin: 16.5 g/dL (ref 13.0–17.7)
Immature Grans (Abs): 0 10*3/uL (ref 0.0–0.1)
Immature Granulocytes: 0 %
Lymphocytes Absolute: 3.3 10*3/uL — ABNORMAL HIGH (ref 0.7–3.1)
Lymphs: 37 %
MCH: 33.7 pg — ABNORMAL HIGH (ref 26.6–33.0)
MCHC: 35.6 g/dL (ref 31.5–35.7)
MCV: 95 fL (ref 79–97)
Monocytes Absolute: 0.6 10*3/uL (ref 0.1–0.9)
Monocytes: 7 %
Neutrophils Absolute: 4.8 10*3/uL (ref 1.4–7.0)
Neutrophils: 54 %
Platelets: 187 10*3/uL (ref 150–450)
RBC: 4.89 x10E6/uL (ref 4.14–5.80)
RDW: 12.5 % (ref 11.6–15.4)
WBC: 8.9 10*3/uL (ref 3.4–10.8)

## 2023-03-01 LAB — BRAIN NATRIURETIC PEPTIDE: BNP: 59.6 pg/mL (ref 0.0–100.0)

## 2023-03-01 LAB — COMPREHENSIVE METABOLIC PANEL
ALT: 22 IU/L (ref 0–44)
AST: 32 IU/L (ref 0–40)
Albumin/Globulin Ratio: 1.6 (ref 1.2–2.2)
Albumin: 4.4 g/dL (ref 3.8–4.8)
Alkaline Phosphatase: 94 IU/L (ref 44–121)
BUN/Creatinine Ratio: 15 (ref 10–24)
BUN: 14 mg/dL (ref 8–27)
Bilirubin Total: 0.9 mg/dL (ref 0.0–1.2)
CO2: 18 mmol/L — ABNORMAL LOW (ref 20–29)
Calcium: 9.4 mg/dL (ref 8.6–10.2)
Chloride: 107 mmol/L — ABNORMAL HIGH (ref 96–106)
Creatinine, Ser: 0.95 mg/dL (ref 0.76–1.27)
Globulin, Total: 2.8 g/dL (ref 1.5–4.5)
Glucose: 109 mg/dL — ABNORMAL HIGH (ref 70–99)
Potassium: 4.1 mmol/L (ref 3.5–5.2)
Sodium: 141 mmol/L (ref 134–144)
Total Protein: 7.2 g/dL (ref 6.0–8.5)
eGFR: 85 mL/min/{1.73_m2} (ref >59)

## 2023-03-01 LAB — TSH: TSH: 2.82 u[IU]/mL (ref 0.450–4.500)

## 2023-03-01 LAB — T4, FREE: Free T4: 1.4 ng/dL (ref 0.82–1.77)

## 2023-03-01 NOTE — Progress Notes (Signed)
Good morning, please let Sharma Covert know his labs have returned with overall stable levels and no changes to explain current fatigue and discomfort.  Cardiology tried to call him yesterday to scheduled and could not leave voicemail.  Please tell him to call [phone] ASAP to schedule with them.  Thank you. Keep being amazing!!  Thank you for allowing me to participate in your care.  I appreciate you. Kindest regards, Aidan Moten

## 2023-03-12 NOTE — Progress Notes (Unsigned)
Cardiology Office Note  Date:  03/13/2023   ID:  William Murray, DOB [birth year], MRN [id number]  PCP:  Marjie Skiff, NP   Chief Complaint  Patient presents with   Chest pain     Patient c/o chest pain, shortness of breath, palpitations, pounding in chest in the evening when lying down. Medications reviewed by the patient verbally.     HPI:  [name] is a 72 year old gentleman with past medical history of Hypertension Ectopy, for years Quit smoking, occasional Chronic shortness of breath, COPD Prior stress test July 2016 Who presents for follow-up of his abnormal EKG, chest pain  Last seen October 2021  Echocardiogram May 2024 EF 55 to 60% Mild aortic valve stenosis  Prior Zio monitor October 2021 with PVCs Started on metoprolol succinate at that time  Remote stress test July 2016, low risk study  In follow-up today he is active though concerned about shortness of breath, having to stop to recover Able to do some work for short periods of time, then stops for 10 minutes to recover before starting again  Remains on metoprolol for PVCs Asymptomatic, denies palpitations  CT scan July 2021 labeled as having coronary calcifications, aortic atherosclerosis  EKG personally reviewed by myself on todays visit Normal sinus rhythm rate 65 bpm no significant ST-T wave changes  Other past medical history reviewed Stress test 2016, no ischemia, for chest pain  Prior cardiac cath, done at [facility], results not available LE arterial Doppler 03/21/2012: normal  CT scan chest July 2015 reviewed with him showing no significant coronary calcifications, no aortic atherosclerosis, no carotid plaque   PMH:   has a past medical history of Anginal pain (HCC), Arthritis, COPD (chronic obstructive pulmonary disease) (HCC), Erectile disorder due to medical condition in male, Gout, Hyperlipidemia, Hypertension, Renal cyst, 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  PSH:    Past  Surgical History:  Procedure Laterality Date   arm surgery Left    CARDIAC CATHETERIZATION     TONSILLECTOMY      Current Outpatient Medications  Medication Sig Dispense Refill   acetaminophen (TYLENOL) 500 MG tablet Take 500 mg by mouth every 6 (six) hours as needed.     allopurinol (ZYLOPRIM) 100 MG tablet Take 1 tablet (100 mg total) by mouth daily. 90 tablet 4   amLODipine (NORVASC) 5 MG tablet Take 1 tablet (5 mg total) by mouth daily. 90 tablet 4   aspirin 81 MG tablet Take 1 tablet (81 mg total) by mouth daily. 30 tablet 12   atorvastatin (LIPITOR) 10 MG tablet Take 1 tablet (10 mg total) by mouth daily at 6 PM. 90 tablet 4   levothyroxine (SYNTHROID) 50 MCG tablet Take 1 tablet (50 mcg total) by mouth daily. 90 tablet 4   losartan (COZAAR) 100 MG tablet Take 1 tablet (100 mg total) by mouth daily. 90 tablet 4   magnesium oxide (MAG-OX) 400 MG tablet Take 800 mg by mouth every evening.     metoprolol succinate (TOPROL-XL) 25 MG 24 hr tablet Take 1 tablet (25 mg total) by mouth daily. 90 tablet 4   tamsulosin (FLOMAX) 0.4 MG CAPS capsule Take 1 capsule (0.4 mg total) by mouth daily. 90 capsule 4   tiZANidine (ZANAFLEX) 4 MG tablet TAKE 1 TABLET (4 MG TOTAL) BY MOUTH EVERY 6 (SIX) HOURS AS NEEDED FOR MUSCLE SPASMS. 360 tablet 1   triamcinolone (KENALOG) 0.1 % Apply 1 application topically 2 (two) times daily. 30 g 0  UNABLE TO FIND Take 2 tablets by mouth daily. Omega OX     No current facility-administered medications for this visit.     Allergies:   Patient has no known allergies.   Social History:  The patient  reports that he quit smoking about 5 years ago. His smoking use included cigarettes. He has a 15.50 pack-year smoking history. He has never used smokeless tobacco. He reports current alcohol use of about 5.0 standard drinks of alcohol per week. He reports that he does not use drugs.   Family History:   family history includes Aneurysm in his sister; Bladder Cancer in  his brother; COPD in his father; Cervical cancer in his sister; Dementia in his father; Diabetes in his mother and sister; Emphysema in his father; Heart disease in his maternal grandfather, maternal grandmother, paternal grandfather, paternal grandmother, and son; Hypertension in his son and son; Renal Disease in his mother.    Review of Systems: Review of Systems  Constitutional: Negative.   HENT: Negative.    Respiratory: Negative.    Cardiovascular: Negative.        Left chest fullness, palpitaions  Gastrointestinal: Negative.   Musculoskeletal: Negative.   Neurological: Negative.   Psychiatric/Behavioral: Negative.    All other systems reviewed and are negative.   PHYSICAL EXAM: VS:  BP 130/62 (BP Location: Left Arm, Patient Position: Sitting, Cuff Size: Normal)   Pulse 65   Ht 5\' 10"  (1.778 m)   Wt 201 lb 2 oz (91.2 kg)   SpO2 96%   BMI 28.86 kg/m  , BMI Body mass index is 28.86 kg/m. Constitutional:  oriented to person, place, and time. No distress.  HENT:  Head: Grossly normal Eyes:  no discharge. No scleral icterus.  Neck: No JVD, no carotid bruits  Cardiovascular: Regular rate and rhythm, no murmurs appreciated Pulmonary/Chest: Clear to auscultation bilaterally, no wheezes or rails Abdominal: Soft.  no distension.  no tenderness.  Musculoskeletal: Normal range of motion Neurological:  normal muscle tone. Coordination normal. No atrophy Skin: Skin warm and dry Psychiatric: normal affect, pleasant   Recent Labs: 06/27/2022: Magnesium 2.0 02/28/2023: ALT 22; BNP 59.6; BUN 14; Creatinine, Ser 0.95; Hemoglobin 16.5; Platelets 187; Potassium 4.1; Sodium 141; TSH 2.820    Lipid Panel Lab Results  Component Value Date   CHOL 106 12/22/2022   HDL 42 12/22/2022   LDLCALC 36 12/22/2022   TRIG 168 (H) 12/22/2022      Wt Readings from Last 3 Encounters:  03/13/23 201 lb 2 oz (91.2 kg)  02/28/23 200 lb 3.2 oz (90.8 kg)  12/22/22 202 lb 12.8 oz (92 kg)      ASSESSMENT AND PLAN:  Chest pain, unspecified type Continues to have chest tightness on exertion, shortness of breath symptoms Prior CT scan no coronary calcifications Normal echocardiogram this past month We have ordered Myoview for risk stratification  Essential hypertension, benign Blood pressure is well controlled on today's visit. No changes made to the medications.  Hypercholesteremia Cholesterol is at goal on the current lipid regimen. No changes to the medications were made.  Stage 1 mild COPD by GOLD classification (HCC) Prior history of smoking,  Suspect smoked 20 years total heavy Chronic shortness of breath on exertion, feels it is worse recently  Tobacco abuse He stopped smoking years ago  Heart palpitations/PVCs Asymptomatic, on metoprolol   Total encounter time more than 30 minutes  Greater than 50% was spent in counseling and coordination of care with the patient  No orders of the defined types were placed in this encounter.    Signed, Dossie Arbour, M.D., Ph.D. 03/13/2023  [facility] [place], [place] [phone]

## 2023-03-13 ENCOUNTER — Encounter: Payer: Self-pay | Admitting: Cardiovascular Disease

## 2023-03-13 ENCOUNTER — Ambulatory Visit: Payer: 59 | Attending: Cardiovascular Disease | Admitting: Cardiovascular Disease

## 2023-03-13 VITALS — BP 130/62 | HR 65 | Ht 70.0 in | Wt 201.1 lb

## 2023-03-13 DIAGNOSIS — E7800 Pure hypercholesterolemia, unspecified: Secondary | ICD-10-CM | POA: Diagnosis not present

## 2023-03-13 DIAGNOSIS — I700 Atherosclerosis of aorta: Secondary | ICD-10-CM

## 2023-03-13 DIAGNOSIS — J432 Centrilobular emphysema: Secondary | ICD-10-CM | POA: Diagnosis not present

## 2023-03-13 DIAGNOSIS — I359 Nonrheumatic aortic valve disorder, unspecified: Secondary | ICD-10-CM

## 2023-03-13 DIAGNOSIS — I10 Essential (primary) hypertension: Secondary | ICD-10-CM

## 2023-03-13 DIAGNOSIS — R0602 Shortness of breath: Secondary | ICD-10-CM

## 2023-03-13 NOTE — Patient Instructions (Addendum)
Medication Instructions:  No changes  If you need a refill on your cardiac medications before your next appointment, please call your pharmacy.   Lab work: No new labs needed  Testing/Procedures: Your provider has ordered a Lexiscan/ Exercise Myoview Stress test. This will take place at [facility]. Please report to the [facility] medical mall entrance. The volunteers at the first desk will direct you where to go.  [facility] MYOVIEW  Your provider has ordered a Stress Test with nuclear imaging. The purpose of this test is to evaluate the blood supply to your heart muscle. This procedure is referred to as a "Non-Invasive Stress Test." This is because other than having an IV started in your vein, nothing is inserted or "invades" your body. Cardiac stress tests are done to find areas of poor blood flow to the heart by determining the extent of coronary artery disease (CAD). Some patients exercise on a treadmill, which naturally increases the blood flow to your heart, while others who are unable to walk on a treadmill due to physical limitations will have a pharmacologic/chemical stress agent called Lexiscan . This medicine will mimic walking on a treadmill by temporarily increasing your coronary blood flow.   Please note: these test may take anywhere between 2-4 hours to complete  How to prepare for your Myoview test:  Nothing to eat for 6 hours prior to the test No caffeine for 24 hours prior to test No smoking 24 hours prior to test. Your medication may be taken with water.  If your doctor stopped a medication because of this test, do not take that medication. Ladies, please do not wear dresses.  Skirts or pants are appropriate. Please wear a short sleeve shirt. No perfume, cologne or lotion. Wear comfortable walking shoes. No heels!   PLEASE NOTIFY THE OFFICE AT LEAST 24 HOURS IN ADVANCE IF YOU ARE UNABLE TO KEEP YOUR APPOINTMENT.  [phone] AND  PLEASE NOTIFY NUCLEAR MEDICINE AT [facility] AT LEAST 24 HOURS  IN ADVANCE IF YOU ARE UNABLE TO KEEP YOUR APPOINTMENT. [phone]   Follow-Up: At [facility], you and your health needs are our priority.  As part of our continuing mission to provide you with exceptional heart care, we have created designated Provider Care Teams.  These Care Teams include your primary Cardiologist (physician) and Advanced Practice Providers (APPs -  Physician Assistants and Nurse Practitioners) who all work together to provide you with the care you need, when you need it.  You will need a follow up appointment in 12 months  Providers on your designated Care Team:   Nicolasa Ducking, NP Eula Listen, PA-C Cadence Fransico Michael, [place]  COVID-19 Vaccine Information can be found at: [email or web address] For questions related to vaccine distribution or appointments, please email [email or web address] or call [phone].

## 2023-03-18 NOTE — Patient Instructions (Signed)
Nonspecific Chest Pain Chest pain can be caused by many different conditions. Some causes of chest pain can be life-threatening. These will require treatment right away. Serious causes of chest pain include: Heart attack. A tear in the body's main blood vessel. Redness and swelling (inflammation) around your heart. Blood clot in your lungs. Other causes of chest pain may not be so serious. These include: Heartburn. Anxiety or stress. Damage to bones or muscles in your chest. Lung infections. Chest pain can feel like: Pain or discomfort in your chest. Crushing, pressure, aching, or squeezing pain. Burning or tingling. Dull or sharp pain that is worse when you move, cough, or take a deep breath. Pain or discomfort that is also felt in your back, neck, jaw, shoulder, or arm, or pain that spreads to any of these areas. It is hard to know whether your pain is caused by something that is serious or something that is not so serious. So it is important to see your doctor right away if you have chest pain. Follow these instructions at home: Medicines Take over-the-counter and prescription medicines only as told by your doctor. If you were prescribed an antibiotic medicine, take it as told by your doctor. Do not stop taking the antibiotic even if you start to feel better. Lifestyle  Rest as told by your doctor. Do not use any products that contain nicotine or tobacco, such as cigarettes, e-cigarettes, and chewing tobacco. If you need help quitting, ask your doctor. Do not drink alcohol. Make lifestyle changes as told by your doctor. These may include: Getting regular exercise. Ask your doctor what activities are safe for you. Eating a heart-healthy diet. A diet and nutrition specialist (dietitian) can help you to learn healthy eating options. Staying at a healthy weight. Treating diabetes or high blood pressure, if needed. Lowering your stress. Activities such as yoga and relaxation techniques  can help. General instructions Pay attention to any changes in your symptoms. Tell your doctor about them or any new symptoms. Avoid any activities that cause chest pain. Keep all follow-up visits as told by your doctor. This is important. You may need more testing if your chest pain does not go away. Contact a doctor if: Your chest pain does not go away. You feel depressed. You have a fever. Get help right away if: Your chest pain is worse. You have a cough that gets worse, or you cough up blood. You have very bad (severe) pain in your belly (abdomen). You pass out (faint). You have either of these for no clear reason: Sudden chest discomfort. Sudden discomfort in your arms, back, neck, or jaw. You have shortness of breath at any time. You suddenly start to sweat, or your skin gets clammy. You feel sick to your stomach (nauseous). You throw up (vomit). You suddenly feel lightheaded or dizzy. You feel very weak or tired. Your heart starts to beat fast, or it feels like it is skipping beats. These symptoms may be an emergency. Do not wait to see if the symptoms will go away. Get medical help right away. Call your local emergency services (911 in the [place] Do not drive yourself to the hospital. Summary Chest pain can be caused by many different conditions. The cause may be serious and need treatment right away. If you have chest pain, see your doctor right away. Follow your doctor's instructions for taking medicines and making lifestyle changes. Keep all follow-up visits as told by your doctor. This includes visits for any further   testing if your chest pain does not go away. Be sure to know the signs that show that your condition has become worse. Get help right away if you have these symptoms. This information is not intended to replace advice given to you by your health care provider. Make sure you discuss any questions you have with your health care provider. Document Revised:  08/10/2022 Document Reviewed: 08/10/2022 Elsevier Patient Education  2024 Elsevier Inc.  

## 2023-03-19 ENCOUNTER — Encounter
Admission: RE | Admit: 2023-03-19 | Discharge: 2023-03-19 | Disposition: A | Discharge status: Home / Self Care | Payer: 59 | Source: Ambulatory Visit | Attending: Cardiovascular Disease | Admitting: Cardiovascular Disease

## 2023-03-19 DIAGNOSIS — R0602 Shortness of breath: Secondary | ICD-10-CM | POA: Diagnosis not present

## 2023-03-19 DIAGNOSIS — I700 Atherosclerosis of aorta: Secondary | ICD-10-CM | POA: Diagnosis not present

## 2023-03-19 LAB — NM MYOCAR MULTI W/SPECT W/WALL MOTION / EF
LV dias vol: 102 mL (ref 62–150)
LV sys vol: 33 mL
Nuc Stress EF: 68 %
Peak HR: 85 {beats}/min
Percent HR: 57 %
Rest HR: 64 {beats}/min
Rest Nuclear Isotope Dose: 10.4 mCi
SDS: 0
SRS: 2
SSS: 2
ST Depression (mm): 0 mm
Stress Nuclear Isotope Dose: 31.7 mCi
TID: 0.94

## 2023-03-19 MED ORDER — REGADENOSON 0.4 MG/5ML IV SOLN
0.4000 mg | Freq: Once | INTRAVENOUS | Status: AC
  Administered 2023-03-19: 0.4 mg via INTRAVENOUS

## 2023-03-19 MED ORDER — TECHNETIUM TC 99M TETROFOSMIN IV KIT
10.4300 | PACK | Freq: Once | INTRAVENOUS | Status: AC | PRN
  Administered 2023-03-19: 10.43 via INTRAVENOUS

## 2023-03-19 MED ORDER — TECHNETIUM TC 99M TETROFOSMIN IV KIT
31.6500 | PACK | Freq: Once | INTRAVENOUS | Status: AC | PRN
  Administered 2023-03-19: 31.65 via INTRAVENOUS

## 2023-03-23 ENCOUNTER — Encounter: Payer: Self-pay | Admitting: Nurse Practitioner

## 2023-03-23 ENCOUNTER — Ambulatory Visit (INDEPENDENT_AMBULATORY_CARE_PROVIDER_SITE_OTHER): Payer: 59 | Admitting: Nurse Practitioner

## 2023-03-23 VITALS — BP 129/70 | HR 65 | Temp 98.0°F | Ht 70.0 in | Wt 201.2 lb

## 2023-03-23 DIAGNOSIS — J432 Centrilobular emphysema: Secondary | ICD-10-CM | POA: Diagnosis not present

## 2023-03-23 DIAGNOSIS — R5383 Other fatigue: Secondary | ICD-10-CM

## 2023-03-23 DIAGNOSIS — F1010 Alcohol abuse, uncomplicated: Secondary | ICD-10-CM

## 2023-03-23 DIAGNOSIS — R12 Heartburn: Secondary | ICD-10-CM | POA: Diagnosis not present

## 2023-03-23 DIAGNOSIS — R002 Palpitations: Secondary | ICD-10-CM

## 2023-03-23 MED ORDER — UMECLIDINIUM-VILANTEROL 62.5-25 MCG/ACT IN AEPB: 1.0000 | INHALATION_SPRAY | Freq: Every day | RESPIRATORY_TRACT | 4 refills | Status: DC

## 2023-03-23 MED ORDER — FAMOTIDINE 20 MG PO TABS
20.0000 mg | ORAL_TABLET | Freq: Every day | ORAL | 4 refills | Status: AC
Start: 2023-03-23 — End: ?

## 2023-03-23 MED ORDER — ALBUTEROL SULFATE HFA 108 (90 BASE) MCG/ACT IN AERS: 2.0000 | INHALATION_SPRAY | Freq: Four times a day (QID) | RESPIRATORY_TRACT | 2 refills | Status: AC | PRN

## 2023-03-23 MED ORDER — FAMOTIDINE 20 MG PO TABS
20.0000 mg | ORAL_TABLET | Freq: Every day | ORAL | 4 refills | Status: DC
Start: 2023-03-23 — End: 2023-03-23

## 2023-03-23 NOTE — Progress Notes (Signed)
BP 129/70   Pulse 65   Temp 98 F (36.7 C) (Oral)   Ht 5\' 10"  (1.778 m)   Wt 201 lb 3.2 oz (91.3 kg)   SpO2 94%   BMI 28.87 kg/m    Subjective:    Patient ID: William Murray, male    DOB: 1951-02-05, 72 y.o.   MRN: [id number]  HPI: William Murray is a 72 y.o. male  Chief Complaint  Patient presents with   Chest Pain    Here for follow up    Fatigue   Wife at bedside to assist with HPI.  FATIGUE Follow-up today for fatigue and chest pain which he initially had visit for on 02/28/23. Has underlying HTN and hypothyroid.  Takes medications for these.  Seen by cardiology for follow-up on 03/13/23 and Myoview was ordered, which was considered low risk and showed minimal calcifications.  No changes were made by cardiology.  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  Endorses SOB, notices this more with activity, has to stop and rest for a minute after activity.  No inhalers at this time.  Does notice has been coughing a bit more over past months.    Echo on 02/12/2023 did note mild aortic valve stenosis and regurgitation.  EF 55-60%.  History of smoking years ago (quit 8-9 years ago, smoked 1 1/2 PPD), goes for annual lung screening with recent on 08/12/22 -- mild emphysema.  Recent spirometry reassuring.  Does endorse alcohol use before bed every night (although some nights he states he does not drink too), 4-5 liquor shots a night -- bourbon -- 7 days a week.  Has been drinking like this since he was 19.  Sometimes feels like he will throw up before bed time and gets reflux.  Duration:  weeks Severity: 8/10  Onset: sudden Context when symptoms started:  no recent illness or tick bites Symptoms improve with rest: no  Depressive symptoms: no Stress/anxiety: no Insomnia: has a good hard sleep 2-3 hours but then is awake on and off rest of night -- has been pattern since his  teenage years Snoring: yes Observed apnea by bed partner: no Daytime hypersomnolence:no Wakes feeling refreshed: no History of sleep study: he thinks so Dysnea on exertion:   a little bit Orthopnea/PND: no Chest pain: none recent Chronic cough: yes Lower extremity edema: sometimes Arthralgias:no Myalgias: no Weakness: no Rash: no     02/28/2023   10:26 AM 12/22/2022    8:40 AM 06/27/2022    8:44 AM 12/21/2021    8:57 AM 06/25/2021    9:57 AM  Depression screen PHQ 2/9  Decreased Interest 1 0 0 0 0  Down, Depressed, Hopeless 0 0 0 0 0  PHQ - 2 Score 1 0 0 0 0  Altered sleeping 0 0 0 0   Tired, decreased energy 1 0 0 0   Change in appetite 0 0 0 0   Feeling bad or failure about yourself  0 0 0 0   Trouble concentrating 0 0 0 0   Moving slowly or fidgety/restless 0 0 0 0   Suicidal thoughts 0 0 0 0   PHQ-9 Score 2 0 0 0   Difficult doing work/chores Not difficult at all Not difficult at all Not difficult at all         02/28/2023   10:26 AM 12/22/2022  8:40 AM 06/27/2022    8:46 AM 12/21/2021    8:57 AM  GAD 7 : Generalized Anxiety Score  Nervous, Anxious, on Edge 0 0 0 0  Control/stop worrying 0 0 0 0  Worry too much - different things 0 0 0 0  Trouble relaxing 0 0 0 0  Restless 0 0 0 0  Easily annoyed or irritable 0 0 0 0  Afraid - awful might happen 0 0 0 0  Total GAD 7 Score 0 0 0 0  Anxiety Difficulty Not difficult at all Not difficult at all Not difficult at all Not difficult at all      Relevant past medical, surgical, family and social history reviewed and updated as indicated. Interim medical history since our last visit reviewed. Allergies and medications reviewed and updated.  Review of Systems  Constitutional:  Positive for fatigue. Negative for activity change, diaphoresis and fever.  Respiratory:  Positive for shortness of breath. Negative for cough, chest tightness and wheezing.   Cardiovascular:  Positive for chest pain and palpitations. Negative for  leg swelling.  Gastrointestinal: Negative.   Endocrine: Negative for cold intolerance, heat intolerance, polydipsia, polyphagia and polyuria.  Neurological:  Positive for dizziness. Negative for tremors, seizures, syncope, weakness, light-headedness, numbness and headaches.  Psychiatric/Behavioral: Negative.      Per HPI unless specifically indicated above     Objective:    BP 129/70   Pulse 65   Temp 98 F (36.7 C) (Oral)   Ht 5\' 10"  (1.778 m)   Wt 201 lb 3.2 oz (91.3 kg)   SpO2 94%   BMI 28.87 kg/m   Wt Readings from Last 3 Encounters:  03/23/23 201 lb 3.2 oz (91.3 kg)  03/13/23 201 lb 2 oz (91.2 kg)  02/28/23 200 lb 3.2 oz (90.8 kg)    Physical Exam Vitals and nursing note reviewed.  Constitutional:      General: He is awake. He is not in acute distress.    Appearance: He is well-developed and well-groomed. He is obese. He is not ill-appearing or toxic-appearing.  HENT:     Head: Normocephalic.     Right Ear: Hearing and external ear normal.     Left Ear: Hearing and external ear normal.  Eyes:     General: Lids are normal.     Extraocular Movements: Extraocular movements intact.     Conjunctiva/sclera: Conjunctivae normal.  Neck:     Thyroid: No thyromegaly.     Vascular: No carotid bruit.  Cardiovascular:     Rate and Rhythm: Normal rate and regular rhythm.     Heart sounds: Normal heart sounds.  Pulmonary:     Effort: No accessory muscle usage or respiratory distress.     Breath sounds: Normal breath sounds.  Abdominal:     General: Bowel sounds are normal. There is no distension.     Palpations: Abdomen is soft.     Tenderness: There is no abdominal tenderness.  Musculoskeletal:     Cervical back: Full passive range of motion without pain.     Right lower leg: Edema (trace) present.     Left lower leg: Edema (trace) present.  Lymphadenopathy:     Cervical: No cervical adenopathy.  Skin:    General: Skin is warm.     Capillary Refill: Capillary refill  takes less than 2 seconds.  Neurological:     Mental Status: He is alert and oriented to person, place, and time.     Deep  Tendon Reflexes: Reflexes are normal and symmetric.     Reflex Scores:      Brachioradialis reflexes are 2+ on the right side and 2+ on the left side.      Patellar reflexes are 2+ on the right side and 2+ on the left side. Psychiatric:        Attention and Perception: Attention normal.        Mood and Affect: Mood normal.        Speech: Speech normal.        Behavior: Behavior normal. Behavior is cooperative.        Thought Content: Thought content normal.    Results for orders placed or performed during the hospital encounter of 03/19/23  NM Myocar Multi W/Spect W/Wall Motion / EF  Result Value Ref Range   Rest BP 127/64 mmHg   Rest HR 64.0 bpm   Peak HR 85 bpm   Peak BP 131/72 mmHg   Percent HR 57.0 %   ST Depression (mm) 0 mm   Rest Nuclear Isotope Dose 10.4 mCi   Stress Nuclear Isotope Dose 31.7 mCi   SSS 2.0    SRS 2.0    SDS 0.0    TID 0.94    LV sys vol 33.0 mL   LV dias vol 102.0 62 - 150 mL   Nuc Stress EF 68 %      Assessment & Plan:   Problem List Items Addressed This Visit       Respiratory   Centrilobular emphysema (HCC) - Primary    Chronic, ongoing with some increased coughing and SOB over past months.  Spirometry September 2023 FEV1 94% and FEV1/FVC 118%.  Will trial Anoro daily and continue Albuterol as needed.  Continue annual CT lung screens until age 79.  Recommend continue cessation of smoking.  Return in 5 weeks.      Relevant Medications   umeclidinium-vilanterol (ANORO ELLIPTA) 62.5-25 MCG/ACT AEPB   albuterol (VENTOLIN HFA) 108 (90 Base) MCG/ACT inhaler     Other   Alcohol abuse    Ongoing, often nightly per his wife and him, although occasionally has nights without use.  Bourbon 4-5 shots.  ?cause of fatigue and heart burn.  Recommend he cut back on liquor use and discussed physiology of liver with aging, that body not  always able to tolerate intake as well.  Reports drinking alcohol since a young age.      Fatigue    Refer to palpitations plan of care.  Overall cardiology evaluation was reassuring.  ?related to COPD with some progression of symptoms or his regular alcohol intake.  Recommend he cut back on alcohol use.      Heart burn    Ongoing at night, ?cause of poor sleep pattern and occasional discomfort.  Start Pepcid 20 MG at night, educated him on this.  Recommend he cut back on nightly alcohol use, which would offer benefit to reflux symptoms.  If ongoing or worsening may need to consider PPI.        Palpitations    PVC history, takes Metoprolol for this. Recent cardiology work-up reassuring, with low risk Myoview.  Highly recommend he cut back on alcohol use.  He is aware if any worsening symptoms to immediately go to ER -- we discussed red flag symptoms.  Recommend increased water intake and cut back on caffeine use.          Follow up plan: Return in about 5 weeks (around 04/27/2023)  for FATIGUE, COPD, GERD, PALPITATIONS.

## 2023-03-23 NOTE — Assessment & Plan Note (Signed)
Ongoing at night, ?cause of poor sleep pattern and occasional discomfort.  Start Pepcid 20 MG at night, educated him on this.  Recommend he cut back on nightly alcohol use, which would offer benefit to reflux symptoms.  If ongoing or worsening may need to consider PPI.

## 2023-03-23 NOTE — Assessment & Plan Note (Signed)
Chronic, ongoing with some increased coughing and SOB over past months.  Spirometry September 2023 FEV1 94% and FEV1/FVC 118%.  Will trial Anoro daily and continue Albuterol as needed.  Continue annual CT lung screens until age 72.  Recommend continue cessation of smoking.  Return in 5 weeks.

## 2023-03-23 NOTE — Assessment & Plan Note (Signed)
Refer to palpitations plan of care.  Overall cardiology evaluation was reassuring.  ?related to COPD with some progression of symptoms or his regular alcohol intake.  Recommend he cut back on alcohol use.

## 2023-03-23 NOTE — Assessment & Plan Note (Signed)
Ongoing, often nightly per his wife and him, although occasionally has nights without use.  Bourbon 4-5 shots.  ?cause of fatigue and heart burn.  Recommend he cut back on liquor use and discussed physiology of liver with aging, that body not always able to tolerate intake as well.  Reports drinking alcohol since a young age.

## 2023-03-23 NOTE — Assessment & Plan Note (Signed)
PVC history, takes Metoprolol for this. Recent cardiology work-up reassuring, with low risk Myoview.  Highly recommend he cut back on alcohol use.  He is aware if any worsening symptoms to immediately go to ER -- we discussed red flag symptoms.  Recommend increased water intake and cut back on caffeine use.

## 2023-04-22 NOTE — Patient Instructions (Signed)

## 2023-04-27 ENCOUNTER — Encounter: Payer: Self-pay | Admitting: Nurse Practitioner

## 2023-04-27 ENCOUNTER — Ambulatory Visit (INDEPENDENT_AMBULATORY_CARE_PROVIDER_SITE_OTHER): Payer: 59 | Admitting: Nurse Practitioner

## 2023-04-27 VITALS — BP 128/71 | HR 65 | Temp 98.3°F | Ht 70.0 in | Wt 198.2 lb

## 2023-04-27 DIAGNOSIS — R5383 Other fatigue: Secondary | ICD-10-CM | POA: Diagnosis not present

## 2023-04-27 DIAGNOSIS — J432 Centrilobular emphysema: Secondary | ICD-10-CM

## 2023-04-27 DIAGNOSIS — R002 Palpitations: Secondary | ICD-10-CM

## 2023-04-27 DIAGNOSIS — R12 Heartburn: Secondary | ICD-10-CM

## 2023-04-27 NOTE — Assessment & Plan Note (Signed)
Chronic, improving symptoms with Anoro.  Spirometry September 2023 FEV1 94% and FEV1/FVC 118%.  Continue Anoro daily and continue Albuterol as needed, he has not needed since starting Anoro.  Continue annual CT lung screens until age 72.  Recommend continue cessation of smoking.  Spirometry needed in September at physical.

## 2023-04-27 NOTE — Assessment & Plan Note (Signed)
PVC history, takes Metoprolol for this. Recent cardiology work-up reassuring, with low risk Myoview.  He is cutting back on alcohol use which is offering benefit.  He is aware if any worsening symptoms to immediately go to ER -- we discussed red flag symptoms.  Recommend increased water intake and cut back on caffeine use.

## 2023-04-27 NOTE — Progress Notes (Signed)
BP 128/71   Pulse 65   Temp 98.3 F (36.8 C) (Oral)   Ht 5\' 10"  (1.778 m)   Wt 198 lb 3.2 oz (89.9 kg)   SpO2 96%   BMI 28.44 kg/m    Subjective:    Patient ID: William Murray, male    DOB: Mar 13, 1951, 72 y.o.   MRN: [id number]  HPI: William Murray is a 72 y.o. male  Chief Complaint  Patient presents with   Fatigue   COPD   Palpitations   Wife at bedside to assist with HPI.  FATIGUE Follow-up today for fatigue and chest pain which he initially had visit for on 02/28/23. Has underlying HTN, COPD, and hypothyroid. We started Anoro inhaler at visit 03/23/23 and recommended cutting back on alcohol use. He reports improvement with Anoro once a day and not having to use Albuterol anymore.  Has cut back on drinking to one a night. Was drinking 4-5 liquor shots a night -- bourbon -- 7 days a week.  Has been drinking like this since age 57.  No further reflux before bed with reduction.  We started Pepcid at last visit which is offering benefit.  Echo on 02/12/2023 did note mild aortic valve stenosis and regurgitation.  EF 55-60%.  History of smoking years ago (quit 8-9 years ago, smoked 1 1/2 PPD), goes for annual lung screening with recent on 08/12/22 -- mild emphysema.  Recent spirometry reassuring.  Seen by cardiology for follow-up on 03/13/23 and Myoview was ordered, which was considered low risk and showed minimal calcifications.  No changes were made by cardiology.  Has underlying PVC's does take Metoprolol for.   Duration: improving, although some fatigue still with the heat Symptoms improve with rest: yes Depressive symptoms: no Stress/anxiety: no Insomnia: no Snoring: yes -- is sleeping all night now with inhaler and cutting back on alcohol use Observed apnea by bed partner: no Daytime hypersomnolence:no Wakes feeling refreshed: no History of sleep study: he thinks so Dysnea on exertion: improved with Anoro Orthopnea/PND: no Chest pain: none recent Chronic cough: yes Lower  extremity edema: occasional Arthralgias:no Myalgias: no Weakness: no Rash: no     04/27/2023   10:31 AM 02/28/2023   10:26 AM 12/22/2022    8:40 AM 06/27/2022    8:44 AM 12/21/2021    8:57 AM  Depression screen PHQ 2/9  Decreased Interest 0 1 0 0 0  Down, Depressed, Hopeless 0 0 0 0 0  PHQ - 2 Score 0 1 0 0 0  Altered sleeping 0 0 0 0 0  Tired, decreased energy 0 1 0 0 0  Change in appetite 0 0 0 0 0  Feeling bad or failure about yourself  0 0 0 0 0  Trouble concentrating 0 0 0 0 0  Moving slowly or fidgety/restless 0 0 0 0 0  Suicidal thoughts 0 0 0 0 0  PHQ-9 Score 0 2 0 0 0  Difficult doing work/chores Not difficult at all Not difficult at all Not difficult at all Not difficult at all        04/27/2023   10:32 AM 02/28/2023   10:26 AM 12/22/2022    8:40 AM 06/27/2022    8:46 AM  GAD 7 : Generalized Anxiety Score  Nervous, Anxious, on Edge 0 0 0 0  Control/stop worrying 0 0 0 0  Worry too much - different things 0 0 0 0  Trouble relaxing 0 0 0 0  Restless 0 0  0 0  Easily annoyed or irritable 0 0 0 0  Afraid - awful might happen 0 0 0 0  Total GAD 7 Score 0 0 0 0  Anxiety Difficulty Not difficult at all Not difficult at all Not difficult at all Not difficult at all      Relevant past medical, surgical, family and social history reviewed and updated as indicated. Interim medical history since our last visit reviewed. Allergies and medications reviewed and updated.  Review of Systems  Constitutional:  Positive for fatigue (improved, but heat makes tired). Negative for activity change, diaphoresis and fever.  Respiratory:  Negative for cough, chest tightness, shortness of breath and wheezing.   Cardiovascular:  Negative for chest pain, palpitations and leg swelling.  Gastrointestinal: Negative.   Neurological: Negative.   Psychiatric/Behavioral: Negative.      Per HPI unless specifically indicated above     Objective:    BP 128/71   Pulse 65   Temp 98.3 F (36.8  C) (Oral)   Ht 5\' 10"  (1.778 m)   Wt 198 lb 3.2 oz (89.9 kg)   SpO2 96%   BMI 28.44 kg/m   Wt Readings from Last 3 Encounters:  04/27/23 198 lb 3.2 oz (89.9 kg)  03/23/23 201 lb 3.2 oz (91.3 kg)  03/13/23 201 lb 2 oz (91.2 kg)    Physical Exam Vitals and nursing note reviewed.  Constitutional:      General: He is awake. He is not in acute distress.    Appearance: He is well-developed and well-groomed. He is obese. He is not ill-appearing or toxic-appearing.  HENT:     Head: Normocephalic.     Right Ear: Hearing and external ear normal.     Left Ear: Hearing and external ear normal.  Eyes:     General: Lids are normal.     Extraocular Movements: Extraocular movements intact.     Conjunctiva/sclera: Conjunctivae normal.  Neck:     Thyroid: No thyromegaly.     Vascular: No carotid bruit.  Cardiovascular:     Rate and Rhythm: Normal rate and regular rhythm.     Heart sounds: Normal heart sounds.  Pulmonary:     Effort: No accessory muscle usage or respiratory distress.     Breath sounds: Normal breath sounds.  Abdominal:     General: Bowel sounds are normal. There is no distension.     Palpations: Abdomen is soft.     Tenderness: There is no abdominal tenderness.  Musculoskeletal:     Cervical back: Full passive range of motion without pain.     Right lower leg: No edema.     Left lower leg: No edema.  Lymphadenopathy:     Cervical: No cervical adenopathy.  Skin:    General: Skin is warm.     Capillary Refill: Capillary refill takes less than 2 seconds.  Neurological:     Mental Status: He is alert and oriented to person, place, and time.     Deep Tendon Reflexes: Reflexes are normal and symmetric.     Reflex Scores:      Brachioradialis reflexes are 2+ on the right side and 2+ on the left side.      Patellar reflexes are 2+ on the right side and 2+ on the left side. Psychiatric:        Attention and Perception: Attention normal.        Mood and Affect: Mood normal.         Speech:  Speech normal.        Behavior: Behavior normal. Behavior is cooperative.        Thought Content: Thought content normal.    Results for orders placed or performed during the hospital encounter of 03/19/23  NM Myocar Multi W/Spect W/Wall Motion / EF  Result Value Ref Range   Rest BP 127/64 mmHg   Rest HR 64.0 bpm   Peak HR 85 bpm   Peak BP 131/72 mmHg   Percent HR 57.0 %   ST Depression (mm) 0 mm   Rest Nuclear Isotope Dose 10.4 mCi   Stress Nuclear Isotope Dose 31.7 mCi   SSS 2.0    SRS 2.0    SDS 0.0    TID 0.94    LV sys vol 33.0 mL   LV dias vol 102.0 62 - 150 mL   Nuc Stress EF 68 %      Assessment & Plan:   Problem List Items Addressed This Visit       Respiratory   Centrilobular emphysema (HCC) - Primary    Chronic, improving symptoms with Anoro.  Spirometry September 2023 FEV1 94% and FEV1/FVC 118%.  Continue Anoro daily and continue Albuterol as needed, he has not needed since starting Anoro.  Continue annual CT lung screens until age 33.  Recommend continue cessation of smoking.  Spirometry needed in September at physical.        Other   Fatigue    Improved with addition of Anoro for COPD and cutting back on alcohol.  Recommend to continue to work on alcohol cut back to improve overall health.      Heart burn    Improved at night with Pepcid, ?cause of poor sleep pattern and occasional discomfort. Continue Pepcid 20 MG at night, educated him on this.  Continue to recommend he cut back on nightly alcohol use, which would offer benefit to reflux symptoms.  If ongoing or worsening may need to consider PPI.        Palpitations    PVC history, takes Metoprolol for this. Recent cardiology work-up reassuring, with low risk Myoview.  He is cutting back on alcohol use which is offering benefit.  He is aware if any worsening symptoms to immediately go to ER -- we discussed red flag symptoms.  Recommend increased water intake and cut back on caffeine use.            Follow up plan: Return in about 2 months (around 07/02/2023) for Annual physical after 06/28/23.

## 2023-04-27 NOTE — Assessment & Plan Note (Signed)
Improved at night with Pepcid, ?cause of poor sleep pattern and occasional discomfort. Continue Pepcid 20 MG at night, educated him on this.  Continue to recommend he cut back on nightly alcohol use, which would offer benefit to reflux symptoms.  If ongoing or worsening may need to consider PPI.

## 2023-04-27 NOTE — Assessment & Plan Note (Signed)
Improved with addition of Anoro for COPD and cutting back on alcohol.  Recommend to continue to work on alcohol cut back to improve overall health.

## 2023-05-16 ENCOUNTER — Other Ambulatory Visit: Payer: Self-pay | Admitting: Nurse Practitioner

## 2023-05-17 NOTE — Telephone Encounter (Signed)
Too soon filled 03/23/23 8.5g, 2RF Requested Prescriptions  Pending Prescriptions Disp Refills   albuterol (VENTOLIN HFA) 108 (90 Base) MCG/ACT inhaler [Pharmacy Med Name: ALBUTEROL HFA (PROAIR) INHALER] 8.5 each 2    Sig: TAKE 2 PUFFS BY MOUTH EVERY 6 HOURS AS NEEDED FOR WHEEZE OR SHORTNESS OF BREATH     Pulmonology:  [facility] 2 Passed - 05/16/2023  9:39 AM      Passed - Last BP in normal range    BP Readings from Last 1 Encounters:  04/27/23 128/71         Passed - Last Heart Rate in normal range    Pulse Readings from Last 1 Encounters:  04/27/23 65         Passed - Valid encounter within last 12 months    Recent Outpatient Visits           2 weeks ago Centrilobular emphysema (HCC)   [place] [facility] [place], [place] Mangi T, NP   1 month ago Centrilobular emphysema (HCC)   [place] [facility] [place], Corrie Dandy T, NP   2 months ago Palpitations   [place] [facility] [place], [place] T, NP   4 months ago Centrilobular emphysema (HCC)   [place] [facility] [place], Corrie Dandy T, NP   10 months ago Centrilobular emphysema (HCC)   [place] [facility] [place], Dorie Rank, NP       Future Appointments             In 1 month [place], Dorie Rank, NP [place] [facility]

## 2023-06-24 NOTE — Patient Instructions (Signed)
Be Involved in Caring For Your Health:  Taking Medications When medications are taken as directed, they can greatly improve your health. But if they are not taken as prescribed, they may not work. In some cases, not taking them correctly can be harmful. To help ensure your treatment remains effective and safe, understand your medications and how to take them. Bring your medications to each visit for review by your provider.  Your lab results, notes, and after visit summary will be available on My Chart. We strongly encourage you to use this feature. If lab results are abnormal the clinic will contact you with the appropriate steps. If the clinic does not contact you assume the results are satisfactory. You can always view your results on My Chart. If you have questions regarding your health or results, please contact the clinic during office hours. You can also ask questions on My Chart.  We at [facility] are grateful that you chose us to provide your care. We strive to provide evidence-based and compassionate care and are always looking for feedback. If you get a survey from the clinic please complete this so we can hear your opinions.  DASH Eating Plan DASH stands for Dietary Approaches to Stop Hypertension. The DASH eating plan is a healthy eating plan that has been shown to: Lower high blood pressure (hypertension). Reduce your risk for type 2 diabetes, heart disease, and stroke. Help with weight loss. What are tips for following this plan? Reading food labels Check food labels for the amount of salt (sodium) per serving. Choose foods with less than 5 percent of the Daily Value (DV) of sodium. In general, foods with less than 300 milligrams (mg) of sodium per serving fit into this eating plan. To find whole grains, look for the word "whole" as the first word in the ingredient list. Shopping Buy products labeled as "low-sodium" or "no salt added." Buy fresh foods. Avoid canned  foods and pre-made or frozen meals. Cooking Try not to add salt when you cook. Use salt-free seasonings or herbs instead of table salt or sea salt. Check with your health care provider or pharmacist before using salt substitutes. Do not fry foods. Cook foods in healthy ways, such as baking, boiling, grilling, roasting, or broiling. Cook using oils that are good for your heart. These include olive, canola, avocado, soybean, and sunflower oil. Meal planning  Eat a balanced diet. This should include: 4 or more servings of fruits and 4 or more servings of vegetables each day. Try to fill half of your plate with fruits and vegetables. 6-8 servings of whole grains each day. 6 or less servings of lean meat, poultry, or fish each day. 1 oz is 1 serving. A 3 oz (85 g) serving of meat is about the same size as the palm of your hand. One egg is 1 oz (28 g). 2-3 servings of low-fat dairy each day. One serving is 1 cup (237 mL). 1 serving of nuts, seeds, or beans 5 times each week. 2-3 servings of heart-healthy fats. Healthy fats called omega-3 fatty acids are found in foods such as walnuts, flaxseeds, fortified milks, and eggs. These fats are also found in cold-water fish, such as sardines, salmon, and mackerel. Limit how much you eat of: Canned or prepackaged foods. Food that is high in trans fat, such as fried foods. Food that is high in saturated fat, such as fatty meat. Desserts and other sweets, sugary drinks, and other foods with added sugar. Full-fat   dairy products. Do not salt foods before eating. Do not eat more than 4 egg yolks a week. Try to eat at least 2 vegetarian meals a week. Eat more home-cooked food and less restaurant, buffet, and fast food. Lifestyle When eating at a restaurant, ask if your food can be made with less salt or no salt. If you drink alcohol: Limit how much you have to: 0-1 drink a day if you are male. 0-2 drinks a day if you are male. Know how much alcohol is in  your drink. In the [place] one drink is one 12 oz bottle of beer (355 mL), one 5 oz glass of wine (148 mL), or one 1 oz glass of hard liquor (44 mL). General information Avoid eating more than 2,300 mg of salt a day. If you have hypertension, you may need to reduce your sodium intake to 1,500 mg a day. Work with your provider to stay at a healthy body weight or lose weight. Ask what the best weight range is for you. On most days of the week, get at least 30 minutes of exercise that causes your heart to beat faster. This may include walking, swimming, or biking. Work with your provider or dietitian to adjust your eating plan to meet your specific calorie needs. What foods should I eat? Fruits All fresh, dried, or frozen fruit. Canned fruits that are in their natural juice and do not have sugar added to them. Vegetables Fresh or frozen vegetables that are raw, steamed, roasted, or grilled. Low-sodium or reduced-sodium tomato and vegetable juice. Low-sodium or reduced-sodium tomato sauce and tomato paste. Low-sodium or reduced-sodium canned vegetables. Grains Whole-grain or whole-wheat bread. Whole-grain or whole-wheat pasta. Brown rice. Oatmeal. Quinoa. Bulgur. Whole-grain and low-sodium cereals. Pita bread. Low-fat, low-sodium crackers. Whole-wheat flour tortillas. Meats and other proteins Skinless chicken or turkey. Ground chicken or turkey. Pork with fat trimmed off. Fish and seafood. Egg whites. Dried beans, peas, or lentils. Unsalted nuts, nut butters, and seeds. Unsalted canned beans. Lean cuts of beef with fat trimmed off. Low-sodium, lean precooked or cured meat, such as sausages or meat loaves. Dairy Low-fat (1%) or fat-free (skim) milk. Reduced-fat, low-fat, or fat-free cheeses. Nonfat, low-sodium ricotta or cottage cheese. Low-fat or nonfat yogurt. Low-fat, low-sodium cheese. Fats and oils Soft margarine without trans fats. Vegetable oil. Reduced-fat, low-fat, or light mayonnaise and salad  dressings (reduced-sodium). Canola, safflower, olive, avocado, soybean, and sunflower oils. Avocado. Seasonings and condiments Herbs. Spices. Seasoning mixes without salt. Other foods Unsalted popcorn and pretzels. Fat-free sweets. The items listed above may not be all the foods and drinks you can have. Talk to a dietitian to learn more. What foods should I avoid? Fruits Canned fruit in a light or heavy syrup. Fried fruit. Fruit in cream or butter sauce. Vegetables Creamed or fried vegetables. Vegetables in a cheese sauce. Regular canned vegetables that are not marked as low-sodium or reduced-sodium. Regular canned tomato sauce and paste that are not marked as low-sodium or reduced-sodium. Regular tomato and vegetable juices that are not marked as low-sodium or reduced-sodium. Pickles. Olives. Grains Baked goods made with fat, such as croissants, muffins, or some breads. Dry pasta or rice meal packs. Meats and other proteins Fatty cuts of meat. Ribs. Fried meat. Bacon. Bologna, salami, and other precooked or cured meats, such as sausages or meat loaves, that are not lean and low in sodium. Fat from the back of a pig (fatback). Bratwurst. Salted nuts and seeds. Canned beans with added salt. Canned   or smoked fish. Whole eggs or egg yolks. Chicken or turkey with skin. Dairy Whole or 2% milk, cream, and half-and-half. Whole or full-fat cream cheese. Whole-fat or sweetened yogurt. Full-fat cheese. Nondairy creamers. Whipped toppings. Processed cheese and cheese spreads. Fats and oils Butter. Stick margarine. Lard. Shortening. Ghee. Bacon fat. Tropical oils, such as coconut, palm kernel, or palm oil. Seasonings and condiments Onion salt, garlic salt, seasoned salt, table salt, and sea salt. Worcestershire sauce. Tartar sauce. Barbecue sauce. Teriyaki sauce. Soy sauce, including reduced-sodium soy sauce. Steak sauce. Canned and packaged gravies. Fish sauce. Oyster sauce. Cocktail sauce. Store-bought  horseradish. Ketchup. Mustard. Meat flavorings and tenderizers. Bouillon cubes. Hot sauces. Pre-made or packaged marinades. Pre-made or packaged taco seasonings. Relishes. Regular salad dressings. Other foods Salted popcorn and pretzels. The items listed above may not be all the foods and drinks you should avoid. Talk to a dietitian to learn more. Where to find more information National Heart, Lung, and Blood Institute (NHLBI): nhlbi.nih.gov American Heart Association (AHA): heart.org Academy of Nutrition and Dietetics: eatright.org National Kidney Foundation (NKF): kidney.org This information is not intended to replace advice given to you by your health care provider. Make sure you discuss any questions you have with your health care provider. Document Revised: 10/12/2022 Document Reviewed: 10/12/2022 Elsevier Patient Education  2024 [employer].  

## 2023-06-29 ENCOUNTER — Encounter: Payer: Self-pay | Admitting: Nurse Practitioner

## 2023-06-29 ENCOUNTER — Ambulatory Visit (INDEPENDENT_AMBULATORY_CARE_PROVIDER_SITE_OTHER): Payer: 59 | Admitting: Nurse Practitioner

## 2023-06-29 VITALS — BP 132/72 | HR 77 | Temp 98.3°F | Ht 68.9 in | Wt 201.8 lb

## 2023-06-29 DIAGNOSIS — E038 Other specified hypothyroidism: Secondary | ICD-10-CM

## 2023-06-29 DIAGNOSIS — R12 Heartburn: Secondary | ICD-10-CM

## 2023-06-29 DIAGNOSIS — M1A0720 Idiopathic chronic gout, left ankle and foot, without tophus (tophi): Secondary | ICD-10-CM

## 2023-06-29 DIAGNOSIS — I700 Atherosclerosis of aorta: Secondary | ICD-10-CM

## 2023-06-29 DIAGNOSIS — I10 Essential (primary) hypertension: Secondary | ICD-10-CM | POA: Diagnosis not present

## 2023-06-29 DIAGNOSIS — E7800 Pure hypercholesterolemia, unspecified: Secondary | ICD-10-CM | POA: Diagnosis not present

## 2023-06-29 DIAGNOSIS — Z0000 Encounter for general adult medical examination without abnormal findings: Secondary | ICD-10-CM | POA: Diagnosis not present

## 2023-06-29 DIAGNOSIS — I359 Nonrheumatic aortic valve disorder, unspecified: Secondary | ICD-10-CM

## 2023-06-29 DIAGNOSIS — J432 Centrilobular emphysema: Secondary | ICD-10-CM

## 2023-06-29 DIAGNOSIS — G2581 Restless legs syndrome: Secondary | ICD-10-CM | POA: Diagnosis not present

## 2023-06-29 DIAGNOSIS — F1010 Alcohol abuse, uncomplicated: Secondary | ICD-10-CM

## 2023-06-29 DIAGNOSIS — N400 Enlarged prostate without lower urinary tract symptoms: Secondary | ICD-10-CM

## 2023-06-29 DIAGNOSIS — Z23 Encounter for immunization: Secondary | ICD-10-CM

## 2023-06-29 MED ORDER — LOSARTAN POTASSIUM 100 MG PO TABS: 100.0000 mg | ORAL_TABLET | Freq: Every day | ORAL | 4 refills | Status: DC

## 2023-06-29 MED ORDER — METOPROLOL SUCCINATE ER 25 MG PO TB24: 25.0000 mg | ORAL_TABLET | Freq: Every day | ORAL | 4 refills | Status: DC

## 2023-06-29 MED ORDER — ALLOPURINOL 100 MG PO TABS: 100.0000 mg | ORAL_TABLET | Freq: Every day | ORAL | 4 refills | Status: DC

## 2023-06-29 MED ORDER — AMLODIPINE BESYLATE 5 MG PO TABS: 5.0000 mg | ORAL_TABLET | Freq: Every day | ORAL | 4 refills | Status: DC

## 2023-06-29 MED ORDER — LEVOTHYROXINE SODIUM 50 MCG PO TABS: 50.0000 ug | ORAL_TABLET | Freq: Every day | ORAL | 4 refills | Status: DC

## 2023-06-29 MED ORDER — TAMSULOSIN HCL 0.4 MG PO CAPS: 0.4000 mg | ORAL_CAPSULE | Freq: Every day | ORAL | 4 refills | Status: DC

## 2023-06-29 MED ORDER — ATORVASTATIN CALCIUM 10 MG PO TABS: 10.0000 mg | ORAL_TABLET | Freq: Every day | ORAL | 4 refills | Status: DC

## 2023-06-29 NOTE — Assessment & Plan Note (Signed)
Chronic, ongoing.  Diagnosed 08/05/21.  Continue current medication regimen and adjust as needed.  Thyroid labs today.

## 2023-06-29 NOTE — Assessment & Plan Note (Signed)
Chronic, improved symptoms with Anoro.  Spirometry September 2023 FEV1 94% and FEV1/FVC 118%.  Continue Anoro daily and continue Albuterol as needed, he has not needed since starting Anoro.  Continue annual CT lung screens until age 72.  Recommend continue cessation of smoking.  Spirometry needed at next visit.

## 2023-06-29 NOTE — Assessment & Plan Note (Addendum)
Chronic, ongoing.  Continue current medication regimen and adjust as needed.  Lipid panel today, fasting.  Refills sent.

## 2023-06-29 NOTE — Assessment & Plan Note (Signed)
Chronic, stable with AUA = 0.  Continue current medication regimen and adjust as needed.  Refills up to date.

## 2023-06-29 NOTE — Assessment & Plan Note (Signed)
Stable with treatment of thyroid and continued magnesium supplement, continue current treatments and adjust as needed.

## 2023-06-29 NOTE — Assessment & Plan Note (Signed)
Noted on lung CT screening on 08/10/22 and mild aortic valve stenosis noted on echo.  Discussed with patient and educated on this.  Monitor for symptoms, currently none.

## 2023-06-29 NOTE — Assessment & Plan Note (Signed)
Ongoing, often nightly per his wife, although occasionally has nights without use.  Bourbon 4-5 shots.  Recommend he cut back on liquor use and discussed physiology of liver with aging, that body not always able to tolerate intake as well.  Reports drinking alcohol since a young age.

## 2023-06-29 NOTE — Assessment & Plan Note (Signed)
Chronic, stable with Allopurinol 100 MG daily, can adjust dose as needed.  Recheck uric acid level and treat flares as needed.  Recommend monitoring diet and continue to cut back on alcohol use.

## 2023-06-29 NOTE — Assessment & Plan Note (Signed)
Ongoing on imaging.  Continue Lipitor and ASA daily for prevention.  Continue complete cessation of smoking.  Continue yearly lung CT scans.

## 2023-06-29 NOTE — Progress Notes (Signed)
BP 132/72 (BP Location: Left Arm, Patient Position: Sitting, Cuff Size: Normal)   Pulse 77   Temp 98.3 F (36.8 C) (Oral)   Ht 5' 8.9" (1.75 m)   Wt 201 lb 12.8 oz (91.5 kg)   SpO2 97%   BMI 29.89 kg/m    Subjective:    Patient ID: William Murray, male    DOB: [birth year], 72 y.o.   MRN: [id number]  HPI: William Murray is a 72 y.o. male presenting on 06/29/2023 for comprehensive medical examination. Current medical complaints include:none  He currently lives with: wife Interim Problems from his last visit: no  HYPERTENSION Continues on Losartan 100 MG, Metoprolol XL 25 MG daily, Amlodipine 5 MG daily, and Atorvastatin 10 MG daily. He continues to drink 1-2 alcohol drinks every night.  Taking Allopurinol 100 MG daily for history of uric acid elevation, this has prevented flares.   Hypertension status: stable  Satisfied with current treatment? yes Duration of hypertension: chronic BP monitoring frequency:  rarely BP range: 120-130/70 range he reports BP medication side effects:  no Medication compliance: good compliance Previous BP meds: Losartan and HCTZ Aspirin: yes Recurrent headaches: no Visual changes: no Palpitations: no Dyspnea: no Chest pain: no Lower extremity edema: occasional Dizzy/lightheaded: no  COPD Last lung screening was 08/10/22 = mild centrilobular emphysema and aortic atherosclerosis + CAD.  It also note calcifications at aortic valve, he saw cardiology 03/13/23 and Myo performed noting low risk.  Echo noted mild aortic valve stenosis.   Also noted to be a right kidney cyst which has remained stable in size, recently 3.9 cm.   Is using  Anoro and Albuterol.  Has history of smoking, quit several years ago -- smoked about 20 years heavily.   COPD status: controlled Satisfied with current treatment?: yes Oxygen use: no Dyspnea frequency:  none Cough frequency: none Rescue inhaler frequency: since starting Anoro rarely uses Limitation of activity:  no Productive cough:  none Last Spirometry: 06/27/22 Pneumovax: Up to Date Influenza: Up to Date   HYPOTHYROIDISM Continues on Levothyroxine 50 MCG daily.  Takes Mag supplement due to history of low levels and to treat RLS. Thyroid control status:stable Satisfied with current treatment? yes Medication side effects: no Medication compliance: good compliance Etiology of hypothyroidism: unknown Recent dose adjustment:no Fatigue: no Cold intolerance: no Heat intolerance: no Weight gain: no Weight loss: no Constipation: no Diarrhea/loose stools: no Palpitations: no Lower extremity edema: occasional Anxiety/depressed mood: no    BPH Continues on Flomax 0.4 MG daily.   BPH status: stable Satisfied with current treatment?: yes Medication side effects: no Medication compliance: good compliance Duration: chronic Nocturia: no Urinary frequency:no Incomplete voiding: no Urgency: no Weak urinary stream: no Straining to start stream: no Dysuria: no Onset: gradual Severity: mild Alleviating factors: Flomax Aggravating factors: unknown Treatments attempted: Flomax IPSS Questionnaire (AUA-7): Over the past month.   1)  How often have you had a sensation of not emptying your bladder completely after you finish urinating?  0 - Not at all  2)  How often have you had to urinate again less than two hours after you finished urinating? 0 - Not at all  3)  How often have you found you stopped and started again several times when you urinated?  0 - Not at all  4) How difficult have you found it to postpone urination?  0 - Not at all  5) How often have you had a weak urinary stream?  0 - Not at  all  6) How often have you had to push or strain to begin urination?  0 - Not at all  7) How many times did you most typically get up to urinate from the time you went to bed until the time you got up in the morning?  0 - None  Total score:  0-7 mildly symptomatic   8-19 moderately symptomatic    20-35 severely symptomatic    Functional Status Survey: Is the patient deaf or have difficulty hearing?: No Does the patient have difficulty seeing, even when wearing glasses/contacts?: No Does the patient have difficulty concentrating, remembering, or making decisions?: No Does the patient have difficulty walking or climbing stairs?: No Does the patient have difficulty dressing or bathing?: No Does the patient have difficulty doing errands alone such as visiting a doctor's office or shopping?: No  FALL RISK:    06/29/2023    1:09 PM 04/27/2023   10:31 AM 02/28/2023   10:25 AM 12/22/2022    8:40 AM 06/27/2022    8:46 AM  Fall Risk   Falls in the past year? 0 1 1 1  0  Number falls in past yr: 0 1 1 1  0  Injury with Fall? 0 0 0 0 0  Risk for fall due to : No Fall Risks History of fall(s)  No Fall Risks No Fall Risks  Follow up Falls evaluation completed Falls evaluation completed Falls evaluation completed Falls evaluation completed Falls evaluation completed   Depression Screen    06/29/2023    1:09 PM 04/27/2023   10:31 AM 02/28/2023   10:26 AM 12/22/2022    8:40 AM 06/27/2022    8:44 AM  Depression screen PHQ 2/9  Decreased Interest 0 0 1 0 0  Down, Depressed, Hopeless 0 0 0 0 0  PHQ - 2 Score 0 0 1 0 0  Altered sleeping 0 0 0 0 0  Tired, decreased energy 0 0 1 0 0  Change in appetite 0 0 0 0 0  Feeling bad or failure about yourself  0 0 0 0 0  Trouble concentrating 0 0 0 0 0  Moving slowly or fidgety/restless 0 0 0 0 0  Suicidal thoughts 0 0 0 0 0  PHQ-9 Score 0 0 2 0 0  Difficult doing work/chores Not difficult at all Not difficult at all Not difficult at all Not difficult at all Not difficult at all      06/29/2023    1:09 PM 04/27/2023   10:32 AM 02/28/2023   10:26 AM 12/22/2022    8:40 AM  GAD 7 : Generalized Anxiety Score  Nervous, Anxious, on Edge 0 0 0 0  Control/stop worrying 0 0 0 0  Worry too much - different things 0 0 0 0  Trouble relaxing 0 0 0 0  Restless 0  0 0 0  Easily annoyed or irritable 0 0 0 0  Afraid - awful might happen 0 0 0 0  Total GAD 7 Score 0 0 0 0  Anxiety Difficulty Not difficult at all Not difficult at all Not difficult at all Not difficult at all   Past Medical History:  Past Medical History:  Diagnosis Date   Anginal pain (HCC)    Arthritis    COPD (chronic obstructive pulmonary disease) (HCC)    Erectile disorder due to medical condition in male    Gout    Hyperlipidemia    Hypertension    Renal cyst, right 09/14/2019  Shortness of breath dyspnea     Surgical History:  Past Surgical History:  Procedure Laterality Date   arm surgery Left    CARDIAC CATHETERIZATION     TONSILLECTOMY      Medications:  Current Outpatient Medications on File Prior to Visit  Medication Sig   acetaminophen (TYLENOL) 500 MG tablet Take 500 mg by mouth every 6 (six) hours as needed.   albuterol (VENTOLIN HFA) 108 (90 Base) MCG/ACT inhaler Inhale 2 puffs into the lungs every 6 (six) hours as needed for wheezing or shortness of breath.   aspirin 81 MG tablet Take 1 tablet (81 mg total) by mouth daily.   famotidine (PEPCID) 20 MG tablet Take 1 tablet (20 mg total) by mouth at bedtime.   magnesium oxide (MAG-OX) 400 MG tablet Take 800 mg by mouth every evening.   tiZANidine (ZANAFLEX) 4 MG tablet TAKE 1 TABLET (4 MG TOTAL) BY MOUTH EVERY 6 (SIX) HOURS AS NEEDED FOR MUSCLE SPASMS.   triamcinolone (KENALOG) 0.1 % Apply 1 application topically 2 (two) times daily.   umeclidinium-vilanterol (ANORO ELLIPTA) 62.5-25 MCG/ACT AEPB Inhale 1 puff into the lungs daily at 6 (six) AM.   UNABLE TO FIND Take 2 tablets by mouth daily. Omega OX   No current facility-administered medications on file prior to visit.    Allergies:  No Known Allergies  Social History:  Social History   Socioeconomic History   Marital status: Married    Spouse name: Not on file   Number of children: Not on file   Years of education: Not on file   Highest  education level: 9th grade  Occupational History   Occupation: retired  Tobacco Use   Smoking status: Former    Current packs/day: 0.00    Average packs/day: 0.3 packs/day for 62.0 years (15.5 ttl pk-yrs)    Types: Cigarettes    Start date: 01/08/1956    Quit date: 01/07/2018    Years since quitting: 5.4   Smokeless tobacco: Never  Vaping Use   Vaping status: Never Used  Substance and Sexual Activity   Alcohol use: Yes    Alcohol/week: 5.0 standard drinks of alcohol    Types: 3 Cans of beer, 2 Shots of liquor per week    Comment: 2 shots of liquor  a week, one beer occasionally    Drug use: No   Sexual activity: Not Currently  Other Topics Concern   Not on file  Social History Narrative   Not on file   Social Determinants of Health   Financial Resource Strain: Low Risk  (06/29/2023)   Overall Financial Resource Strain (CARDIA)    Difficulty of Paying Living Expenses: Not hard at all  Food Insecurity: No Food Insecurity (06/29/2023)   Hunger Vital Sign    Worried About Running Out of Food in the Last Year: Never true    Ran Out of Food in the Last Year: Never true  Transportation Needs: No Transportation Needs (06/29/2023)   PRAPARE - Administrator, Civil Service (Medical): No    Lack of Transportation (Non-Medical): No  Physical Activity: Sufficiently Active (06/29/2023)   Exercise Vital Sign    Days of Exercise per Week: 5 days    Minutes of Exercise per Session: 30 min  Stress: No Stress Concern Present (06/29/2023)   [employer] of Occupational Health - Occupational Stress Questionnaire    Feeling of Stress : Not at all  Social Connections: Moderately Isolated (06/29/2023)   Social Connection  and Isolation Panel [NHANES]    Frequency of Communication with Friends and Family: More than three times a week    Frequency of Social Gatherings with Friends and Family: More than three times a week    Attends Religious Services: Never    Database administrator or  Organizations: No    Attends Banker Meetings: Never    Marital Status: Married  Catering manager Violence: Not At Risk (06/29/2023)   Humiliation, Afraid, Rape, and Kick questionnaire    Fear of Current or Ex-Partner: No    Emotionally Abused: No    Physically Abused: No    Sexually Abused: No   Social History   Tobacco Use  Smoking Status Former   Current packs/day: 0.00   Average packs/day: 0.3 packs/day for 62.0 years (15.5 ttl pk-yrs)   Types: Cigarettes   Start date: 01/08/1956   Quit date: 01/07/2018   Years since quitting: 5.4  Smokeless Tobacco Never   Social History   Substance and Sexual Activity  Alcohol Use Yes   Alcohol/week: 5.0 standard drinks of alcohol   Types: 3 Cans of beer, 2 Shots of liquor per week   Comment: 2 shots of liquor  a week, one beer occasionally     Family History:  Family History  Problem Relation Age of Onset   Renal Disease Mother    Diabetes Mother    Dementia Father    COPD Father    Emphysema Father    Cervical cancer Sister    Diabetes Sister    Bladder Cancer Brother    Hypertension Son    Heart disease Son    Heart disease Maternal Grandmother    Heart disease Maternal Grandfather    Heart disease Paternal Grandmother    Heart disease Paternal Grandfather    Aneurysm Sister    Hypertension Son     Past medical history, surgical history, medications, allergies, family history and social history reviewed with patient today and changes made to appropriate areas of the chart.   ROS All other ROS negative except what is listed above and in the HPI.      Objective:    BP 132/72 (BP Location: Left Arm, Patient Position: Sitting, Cuff Size: Normal)   Pulse 77   Temp 98.3 F (36.8 C) (Oral)   Ht 5' 8.9" (1.75 m)   Wt 201 lb 12.8 oz (91.5 kg)   SpO2 97%   BMI 29.89 kg/m   Wt Readings from Last 3 Encounters:  06/29/23 201 lb 12.8 oz (91.5 kg)  04/27/23 198 lb 3.2 oz (89.9 kg)  03/23/23 201 lb 3.2 oz (91.3  kg)    No results found.  Physical Exam Vitals and nursing note reviewed.  Constitutional:      General: He is awake. He is not in acute distress.    Appearance: He is well-developed, well-groomed and overweight. He is not ill-appearing or toxic-appearing.  HENT:     Head: Normocephalic and atraumatic.     Right Ear: Hearing, tympanic membrane, ear canal and external ear normal. No drainage.     Left Ear: Hearing, tympanic membrane, ear canal and external ear normal. No drainage.     Nose: Nose normal.     Mouth/Throat:     Mouth: Mucous membranes are moist.  Eyes:     General: Lids are normal.        Right eye: No discharge.        Left eye: No discharge.  Extraocular Movements: Extraocular movements intact.     Conjunctiva/sclera: Conjunctivae normal.     Pupils: Pupils are equal, round, and reactive to light.     Visual Fields: Right eye visual fields normal and left eye visual fields normal.  Neck:     Thyroid: No thyromegaly.     Vascular: No carotid bruit or JVD.     Trachea: Trachea normal.  Cardiovascular:     Rate and Rhythm: Normal rate and regular rhythm.     Heart sounds: Normal heart sounds, S1 normal and S2 normal. No murmur heard.    No gallop.  Pulmonary:     Effort: Pulmonary effort is normal. No accessory muscle usage or respiratory distress.     Breath sounds: Normal breath sounds.  Abdominal:     General: Bowel sounds are normal.     Palpations: Abdomen is soft. There is no hepatomegaly or splenomegaly.     Tenderness: There is no abdominal tenderness.  Musculoskeletal:        General: Normal range of motion.     Cervical back: Normal range of motion and neck supple.     Right lower leg: No edema.     Left lower leg: No edema.  Lymphadenopathy:     Head:     Right side of head: No submental, submandibular, tonsillar, preauricular or posterior auricular adenopathy.     Left side of head: No submental, submandibular, tonsillar, preauricular or  posterior auricular adenopathy.     Cervical: No cervical adenopathy.  Skin:    General: Skin is warm and dry.     Capillary Refill: Capillary refill takes less than 2 seconds.     Findings: No rash.  Neurological:     Mental Status: He is alert and oriented to person, place, and time.     Gait: Gait is intact.     Deep Tendon Reflexes: Reflexes are normal and symmetric.     Reflex Scores:      Brachioradialis reflexes are 2+ on the right side and 2+ on the left side.      Patellar reflexes are 2+ on the right side and 2+ on the left side. Psychiatric:        Attention and Perception: Attention normal.        Mood and Affect: Mood normal.        Speech: Speech normal.        Behavior: Behavior normal. Behavior is cooperative.        Thought Content: Thought content normal.        Cognition and Memory: Cognition normal.        Judgment: Judgment normal.       06/27/2022    8:46 AM 06/25/2021   10:03 AM 06/23/2021    9:40 AM 05/14/2020   10:37 AM 05/08/2018    8:51 AM  6CIT Screen  What Year? 0 points 0 points 0 points 0 points 0 points  What month? 0 points 0 points 0 points 0 points 0 points  What time? 0 points 0 points 0 points 0 points 0 points  Count back from 20 0 points 0 points 0 points 0 points 0 points  Months in reverse 4 points 4 points 0 points 0 points 0 points  Repeat phrase 0 points 10 points 0 points 2 points 0 points  Total Score 4 points 14 points 0 points 2 points 0 points   Results for orders placed or performed during the hospital encounter of  03/19/23  NM Myocar Multi W/Spect W/Wall Motion / EF  Result Value Ref Range   Rest BP 127/64 mmHg   Rest HR 64.0 bpm   Peak HR 85 bpm   Peak BP 131/72 mmHg   Percent HR 57.0 %   ST Depression (mm) 0 mm   Rest Nuclear Isotope Dose 10.4 mCi   Stress Nuclear Isotope Dose 31.7 mCi   SSS 2.0    SRS 2.0    SDS 0.0    TID 0.94    LV sys vol 33.0 mL   LV dias vol 102.0 62 - 150 mL   Nuc Stress EF 68 %       Assessment & Plan:   Problem List Items Addressed This Visit       Cardiovascular and Mediastinum   Aortic atherosclerosis (HCC)    Ongoing on imaging.  Continue Lipitor and ASA daily for prevention.  Continue complete cessation of smoking.  Continue yearly lung CT scans.      Relevant Medications   metoprolol succinate (TOPROL-XL) 25 MG 24 hr tablet   losartan (COZAAR) 100 MG tablet   atorvastatin (LIPITOR) 10 MG tablet   amLODipine (NORVASC) 5 MG tablet   Aortic valve calcification    Noted on lung CT screening on 08/10/22 and mild aortic valve stenosis noted on echo.  Discussed with patient and educated on this.  Monitor for symptoms, currently none.      Relevant Medications   metoprolol succinate (TOPROL-XL) 25 MG 24 hr tablet   losartan (COZAAR) 100 MG tablet   atorvastatin (LIPITOR) 10 MG tablet   amLODipine (NORVASC) 5 MG tablet   Essential hypertension, benign    Chronic, stable.  BP at goal for age in office on recheck and at home.  Recommend he monitor BP at least a few mornings a week at home and document.  DASH diet at home.  Continue current medication regimen and adjust as needed.  Labs today: CBC, CMP, Lipid.  Refills sent.  Return in 6 months.       Relevant Medications   metoprolol succinate (TOPROL-XL) 25 MG 24 hr tablet   losartan (COZAAR) 100 MG tablet   atorvastatin (LIPITOR) 10 MG tablet   amLODipine (NORVASC) 5 MG tablet   Other Relevant Orders   CBC with Differential/Platelet   Comprehensive metabolic panel     Respiratory   Centrilobular emphysema (HCC) - Primary    Chronic, improved symptoms with Anoro.  Spirometry September 2023 FEV1 94% and FEV1/FVC 118%.  Continue Anoro daily and continue Albuterol as needed, he has not needed since starting Anoro.  Continue annual CT lung screens until age 46.  Recommend continue cessation of smoking.  Spirometry needed at next visit.        Endocrine   Subclinical hypothyroidism    Chronic, ongoing.   Diagnosed 08/05/21.  Continue current medication regimen and adjust as needed.  Thyroid labs today.      Relevant Medications   metoprolol succinate (TOPROL-XL) 25 MG 24 hr tablet   levothyroxine (SYNTHROID) 50 MCG tablet   Other Relevant Orders   Magnesium   TSH   T4, free     Genitourinary   BPH (benign prostatic hyperplasia)    Chronic, stable with AUA = 0.  Continue current medication regimen and adjust as needed.  Refills up to date.      Relevant Medications   tamsulosin (FLOMAX) 0.4 MG CAPS capsule     Other   Alcohol abuse  Ongoing, often nightly per his wife, although occasionally has nights without use.  Bourbon 4-5 shots.  Recommend he cut back on liquor use and discussed physiology of liver with aging, that body not always able to tolerate intake as well.  Reports drinking alcohol since a young age.      Chronic gout    Chronic, stable with Allopurinol 100 MG daily, can adjust dose as needed.  Recheck uric acid level and treat flares as needed.  Recommend monitoring diet and continue to cut back on alcohol use.      Relevant Orders   Uric acid   Hypercholesteremia    Chronic, ongoing.  Continue current medication regimen and adjust as needed.  Lipid panel today, fasting.  Refills sent.      Relevant Medications   metoprolol succinate (TOPROL-XL) 25 MG 24 hr tablet   losartan (COZAAR) 100 MG tablet   atorvastatin (LIPITOR) 10 MG tablet   amLODipine (NORVASC) 5 MG tablet   Other Relevant Orders   Comprehensive metabolic panel   Lipid Panel w/o Chol/HDL Ratio   Restless leg syndrome    Stable with treatment of thyroid and continued magnesium supplement, continue current treatments and adjust as needed.      Other Visit Diagnoses     Encounter for annual physical exam       Annual physical exam with health maintenance reviewed.        Discussed aspirin prophylaxis for myocardial infarction prevention and decision was made to continue ASA  LABORATORY  TESTING:  Health maintenance labs ordered today as discussed above.   The natural history of prostate cancer and ongoing controversy regarding screening and potential treatment outcomes of prostate cancer has been discussed with the patient. The meaning of a false positive PSA and a false negative PSA has been discussed. He indicates understanding of the limitations of this screening test and wishes to proceed with screening PSA testing.   IMMUNIZATIONS:   - Tdap: Tetanus vaccination status reviewed: last tetanus booster within 10 years. - Influenza: Will get at pharmacy in October - Pneumovax: Up to date - Prevnar: Up to date - Zostavax vaccine: Up to date - Covid: Will get at pharmacy in October  SCREENING: - Colonoscopy: Cologuard up to date == 06/28/2024 Discussed with patient purpose of the colonoscopy is to detect colon cancer at curable precancerous or early stages   - AAA Screening: Up To Date -Hearing Test: Not applicable  -Spirometry: Up to date   PATIENT COUNSELING:    Sexuality: Discussed sexually transmitted diseases, partner selection, use of condoms, avoidance of unintended pregnancy  and contraceptive alternatives.   Advised to avoid cigarette smoking.  I discussed with the patient that most people either abstain from alcohol or drink within safe limits (<=14/week and <=4 drinks/occasion for males, <=7/weeks and <= 3 drinks/occasion for females) and that the risk for alcohol disorders and other health effects rises proportionally with the number of drinks per week and how often a drinker exceeds daily limits.  Discussed cessation/primary prevention of drug use and availability of treatment for abuse.   Diet: Encouraged to adjust caloric intake to maintain  or achieve ideal body weight, to reduce intake of dietary saturated fat and total fat, to limit sodium intake by avoiding high sodium foods and not adding table salt, and to maintain adequate dietary potassium and  calcium preferably from fresh fruits, vegetables, and low-fat dairy products.    Stressed the importance of regular exercise  Injury prevention: Discussed  safety belts, safety helmets, smoke detector, smoking near bedding or upholstery.   Dental health: Discussed importance of regular tooth brushing, flossing, and dental visits.   Follow up plan: NEXT PREVENTATIVE PHYSICAL DUE IN 1 YEAR. Return in about 6 months (around 12/27/2023) for HTN/HLD, GOUT, COPD -- needs spirometry.

## 2023-06-29 NOTE — Assessment & Plan Note (Signed)
Chronic, stable.  BP at goal for age in office on recheck and at home.  Recommend he monitor BP at least a few mornings a week at home and document.  DASH diet at home.  Continue current medication regimen and adjust as needed.  Labs today: CBC, CMP, Lipid.  Refills sent.  Return in 6 months.

## 2023-06-30 ENCOUNTER — Other Ambulatory Visit: Payer: Self-pay | Admitting: Nurse Practitioner

## 2023-06-30 LAB — CBC WITH DIFFERENTIAL/PLATELET
Basophils Absolute: 0 10*3/uL (ref 0.0–0.2)
Basos: 1 %
EOS (ABSOLUTE): 0.2 10*3/uL (ref 0.0–0.4)
Eos: 2 %
Hematocrit: 47.3 % (ref 37.5–51.0)
Hemoglobin: 16.2 g/dL (ref 13.0–17.7)
Immature Grans (Abs): 0 10*3/uL (ref 0.0–0.1)
Immature Granulocytes: 0 %
Lymphocytes Absolute: 3.1 10*3/uL (ref 0.7–3.1)
Lymphs: 37 %
MCH: 33.9 pg — ABNORMAL HIGH (ref 26.6–33.0)
MCHC: 34.2 g/dL (ref 31.5–35.7)
MCV: 99 fL — ABNORMAL HIGH (ref 79–97)
Monocytes Absolute: 0.7 10*3/uL (ref 0.1–0.9)
Monocytes: 8 %
Neutrophils Absolute: 4.5 10*3/uL (ref 1.4–7.0)
Neutrophils: 52 %
Platelets: 194 10*3/uL (ref 150–450)
RBC: 4.78 x10E6/uL (ref 4.14–5.80)
RDW: 12.6 % (ref 11.6–15.4)
WBC: 8.6 10*3/uL (ref 3.4–10.8)

## 2023-06-30 LAB — URIC ACID: Uric Acid: 4.6 mg/dL (ref 3.8–8.4)

## 2023-06-30 LAB — COMPREHENSIVE METABOLIC PANEL
ALT: 21 IU/L (ref 0–44)
AST: 34 IU/L (ref 0–40)
Albumin: 4.3 g/dL (ref 3.8–4.8)
Alkaline Phosphatase: 90 IU/L (ref 44–121)
BUN/Creatinine Ratio: 17 (ref 10–24)
BUN: 15 mg/dL (ref 8–27)
Bilirubin Total: 0.5 mg/dL (ref 0.0–1.2)
CO2: 20 mmol/L (ref 20–29)
Calcium: 9.6 mg/dL (ref 8.6–10.2)
Chloride: 105 mmol/L (ref 96–106)
Creatinine, Ser: 0.86 mg/dL (ref 0.76–1.27)
Globulin, Total: 3 g/dL (ref 1.5–4.5)
Glucose: 136 mg/dL — ABNORMAL HIGH (ref 70–99)
Potassium: 3.9 mmol/L (ref 3.5–5.2)
Sodium: 141 mmol/L (ref 134–144)
Total Protein: 7.3 g/dL (ref 6.0–8.5)
eGFR: 91 mL/min/{1.73_m2} (ref >59)

## 2023-06-30 LAB — LIPID PANEL W/O CHOL/HDL RATIO
Cholesterol, Total: 124 mg/dL (ref 100–199)
HDL: 32 mg/dL — ABNORMAL LOW (ref >39)
LDL Chol Calc (NIH): 38 mg/dL (ref 0–99)
Triglycerides: 368 mg/dL — ABNORMAL HIGH (ref 0–149)
VLDL Cholesterol Cal: 54 mg/dL — ABNORMAL HIGH (ref 5–40)

## 2023-06-30 LAB — TSH: TSH: 3.23 u[IU]/mL (ref 0.450–4.500)

## 2023-06-30 LAB — MAGNESIUM: Magnesium: 1.8 mg/dL (ref 1.6–2.3)

## 2023-06-30 LAB — T4, FREE: Free T4: 1.33 ng/dL (ref 0.82–1.77)

## 2023-06-30 MED ORDER — ATORVASTATIN CALCIUM 20 MG PO TABS
20.0000 mg | ORAL_TABLET | Freq: Every evening | ORAL | 4 refills | Status: DC
Start: 2023-06-30 — End: 2024-08-24

## 2023-06-30 NOTE — Progress Notes (Signed)
Good morning, please let Sharma Covert know labs have returned: - CBC shows no anemia or infection. - Kidney and liver function are normal.  Glucose, sugar, level is a little elevated.  I am going to try to add on A1c to your labs and ensure no diabetes present.  If I am unable to add on then I will have you come in for lab only visit.  We will let you know. - Cholesterol levels are overall stable with exception of triglycerides.  These remain elevated.  Continue Atorvastatin, but I am going to increase dose to 20 MG daily.  Stop 10 MG dosing and we will recheck levels next visit.  Also cut back on alcohol use. - Remainder of labs are normal, including thyroid.  Continue remainder of medications.  Any questions? Keep being amazing!!  Thank you for allowing me to participate in your care.  I appreciate you. Kindest regards, Linet Brash

## 2023-07-24 ENCOUNTER — Ambulatory Visit: Payer: 59 | Admitting: Emergency Medicine

## 2023-07-24 VITALS — BP 150/72 | Ht 69.0 in | Wt 201.2 lb

## 2023-07-24 DIAGNOSIS — Z0000 Encounter for general adult medical examination without abnormal findings: Secondary | ICD-10-CM

## 2023-07-24 NOTE — Patient Instructions (Addendum)
William Murray , Thank you for taking time to come for your Medicare Wellness Visit. I appreciate your ongoing commitment to your health goals. Please review the following plan we discussed and let me know if I can assist you in the future.   Referrals/Orders/Follow-Ups/Clinician Recommendations: Get the flu and covid vaccines as scheduled. Get the low dose CT scan on 08/14/23 as scheduled.  This is a list of the screening recommended for you and due dates:  Health Maintenance  Topic Date Due   COVID-19 Vaccine (4 - 2023-24 season) 06/10/2023   Flu Shot  01/07/2024*   Cologuard (Stool DNA test)  06/28/2024   Medicare Annual Wellness Visit  07/23/2024   DTaP/Tdap/Td vaccine (3 - Tdap) 12/21/2030   Pneumonia Vaccine  Completed   Hepatitis C Screening  Completed   Zoster (Shingles) Vaccine  Completed   HPV Vaccine  Aged Out  *Topic was postponed. The date shown is not the original due date.    Advanced directives: (Provided) Advance directive discussed with you today. I have provided a copy for you to complete at home and have notarized. Once this is complete, please bring a copy in to our office so we can scan it into your chart.   Next Medicare Annual Wellness Visit scheduled for next year: Yes, 07/29/24 @ 8:40am

## 2023-07-24 NOTE — Progress Notes (Signed)
Subjective:   William Murray is a 72 y.o. male who presents for Medicare Annual/Subsequent preventive examination.  Visit Complete: In person   Cardiac Risk Factors include: advanced age (>75men, >8 women);male gender;hypertension;dyslipidemia     Objective:    Today's Vitals   07/24/23 0857 07/24/23 0911  BP: (!) 168/74 (!) 150/72  Weight: 201 lb 3.2 oz (91.3 kg)   Height: 5\' 9"  (1.753 m)    Body mass index is 29.71 kg/m.     07/24/2023    9:12 AM 06/25/2021   10:00 AM 05/14/2020   10:34 AM 05/14/2019    8:19 AM 05/08/2018    8:47 AM 05/04/2017    8:16 AM 01/16/2017    8:04 AM  Advanced Directives  Does Patient Have a Medical Advance Directive? No No;Yes Yes Yes No No No  Type of Advance Directive  Living will Healthcare Power of [place];Living will Living will;Healthcare Power of Attorney     Does patient want to make changes to medical advance directive?  Yes (MAU/Ambulatory/Procedural Areas - Information given)       Copy of Healthcare Power of Attorney in Chart?  No - copy requested No - copy requested Yes - validated most recent copy scanned in chart (See row information)     Would patient like information on creating a medical advance directive? Yes (MAU/Ambulatory/Procedural Areas - Information given) No - Patient declined   Yes (MAU/Ambulatory/Procedural Areas - Information given) Yes (MAU/Ambulatory/Procedural Areas - Information given)     Current Medications (verified) Outpatient Encounter Medications as of 07/24/2023  Medication Sig   acetaminophen (TYLENOL) 500 MG tablet Take 500 mg by mouth every 6 (six) hours as needed.   albuterol (VENTOLIN HFA) 108 (90 Base) MCG/ACT inhaler Inhale 2 puffs into the lungs every 6 (six) hours as needed for wheezing or shortness of breath.   allopurinol (ZYLOPRIM) 100 MG tablet Take 1 tablet (100 mg total) by mouth daily.   amLODipine (NORVASC) 5 MG tablet Take 1 tablet (5 mg total) by mouth daily.   aspirin 81 MG tablet Take  1 tablet (81 mg total) by mouth daily.   atorvastatin (LIPITOR) 20 MG tablet Take 1 tablet (20 mg total) by mouth every evening.   famotidine (PEPCID) 20 MG tablet Take 1 tablet (20 mg total) by mouth at bedtime.   levothyroxine (SYNTHROID) 50 MCG tablet Take 1 tablet (50 mcg total) by mouth daily.   losartan (COZAAR) 100 MG tablet Take 1 tablet (100 mg total) by mouth daily.   magnesium oxide (MAG-OX) 400 MG tablet Take 800 mg by mouth every evening.   metoprolol succinate (TOPROL-XL) 25 MG 24 hr tablet Take 1 tablet (25 mg total) by mouth daily.   tamsulosin (FLOMAX) 0.4 MG CAPS capsule Take 1 capsule (0.4 mg total) by mouth daily.   tiZANidine (ZANAFLEX) 4 MG tablet TAKE 1 TABLET (4 MG TOTAL) BY MOUTH EVERY 6 (SIX) HOURS AS NEEDED FOR MUSCLE SPASMS.   umeclidinium-vilanterol (ANORO ELLIPTA) 62.5-25 MCG/ACT AEPB Inhale 1 puff into the lungs daily at 6 (six) AM.   UNABLE TO FIND Take 2 tablets by mouth daily. Omega OX   triamcinolone (KENALOG) 0.1 % Apply 1 application topically 2 (two) times daily. (Patient not taking: Reported on 07/24/2023)   No facility-administered encounter medications on file as of 07/24/2023.    Allergies (verified) Patient has no known allergies.   History: Past Medical History:  Diagnosis Date   Anginal pain (HCC)    Arthritis  COPD (chronic obstructive pulmonary disease) (HCC)    Erectile disorder due to medical condition in male    Gout    Hyperlipidemia    Hypertension    Renal cyst, right 09/14/2019   Shortness of breath dyspnea    Past Surgical History:  Procedure Laterality Date   arm surgery Left    CARDIAC CATHETERIZATION     TONSILLECTOMY     Family History  Problem Relation Age of Onset   Renal Disease Mother    Diabetes Mother    Dementia Father    COPD Father    Emphysema Father    Cervical cancer Sister    Diabetes Sister    Bladder Cancer Brother    Hypertension Son    Heart disease Son    Heart disease Maternal Grandmother     Heart disease Maternal Grandfather    Heart disease Paternal Grandmother    Heart disease Paternal Grandfather    Aneurysm Sister    Hypertension Son    Social History   Socioeconomic History   Marital status: Married    Spouse name: Meriam Sprague   Number of children: 3   Years of education: Not on file   Highest education level: 9th grade  Occupational History   Occupation: retired  Tobacco Use   Smoking status: Former    Current packs/day: 0.00    Average packs/day: 0.3 packs/day for 62.0 years (15.5 ttl pk-yrs)    Types: Cigarettes    Start date: 01/08/1956    Quit date: 01/07/2018    Years since quitting: 5.5   Smokeless tobacco: Never  Vaping Use   Vaping status: Never Used  Substance and Sexual Activity   Alcohol use: Yes    Alcohol/week: 7.0 standard drinks of alcohol    Types: 7 Shots of liquor per week    Comment: 3 shot of liquor every day   Drug use: No   Sexual activity: Not Currently  Other Topics Concern   Not on file  Social History Narrative   Married, 3 sons by first marriage   Social Determinants of Health   Financial Resource Strain: Low Risk  (07/24/2023)   Overall Financial Resource Strain (CARDIA)    Difficulty of Paying Living Expenses: Not hard at all  Food Insecurity: No Food Insecurity (07/24/2023)   Hunger Vital Sign    Worried About Running Out of Food in the Last Year: Never true    Ran Out of Food in the Last Year: Never true  Transportation Needs: No Transportation Needs (07/24/2023)   PRAPARE - Administrator, Civil Service (Medical): No    Lack of Transportation (Non-Medical): No  Physical Activity: Insufficiently Active (07/24/2023)   Exercise Vital Sign    Days of Exercise per Week: 2 days    Minutes of Exercise per Session: 40 min  Stress: No Stress Concern Present (07/24/2023)   [employer] of Occupational Health - Occupational Stress Questionnaire    Feeling of Stress : Not at all  Social Connections:  Moderately Isolated (07/24/2023)   Social Connection and Isolation Panel [NHANES]    Frequency of Communication with Friends and Family: Never    Frequency of Social Gatherings with Friends and Family: More than three times a week    Attends Religious Services: Never    Database administrator or Organizations: No    Attends Banker Meetings: Never    Marital Status: Married    Tobacco Counseling Counseling given: Not Answered  Clinical Intake:  Pre-visit preparation completed: Yes  Pain : No/denies pain     BMI - recorded: 29.71 Nutritional Status: BMI 25 -29 Overweight Nutritional Risks: None Diabetes: No  How often do you need to have someone help you when you read instructions, pamphlets, or other written materials from your doctor or pharmacy?: 3 - Sometimes What is the last grade level you completed in school?: 8th  Interpreter Needed?: No  Information entered by :: Tora Kindred, CMA   Activities of Daily Living    07/24/2023    8:59 AM 06/29/2023    1:20 PM  In your present state of health, do you have any difficulty performing the following activities:  Hearing? 0 0  Vision? 0 0  Difficulty concentrating or making decisions? 0 0  Walking or climbing stairs? 0 0  Dressing or bathing? 0 0  Doing errands, shopping? 0 0  Preparing Food and eating ? N   Using the Toilet? N   In the past six months, have you accidently leaked urine? N   Do you have problems with loss of bowel control? N   Managing your Medications? N   Managing your Finances? N   Housekeeping or managing your Housekeeping? N     Patient Care Team: Marjie Skiff, NP as PCP - General (Nurse Practitioner)  Indicate any recent Medical Services you may have received from other than [facility] providers in the past year (date may be approximate).     Assessment:   This is a routine wellness examination for Jionni.  Hearing/Vision screen Hearing Screening - Comments:: Denies  hearing loss Vision Screening - Comments:: Gets routine eye exams   Goals Addressed               This Visit's Progress     Reduce alcohol intake (pt-stated)        Depression Screen    07/24/2023    9:08 AM 06/29/2023    1:09 PM 04/27/2023   10:31 AM 02/28/2023   10:26 AM 12/22/2022    8:40 AM 06/27/2022    8:44 AM 12/21/2021    8:57 AM  PHQ 2/9 Scores  PHQ - 2 Score 0 0 0 1 0 0 0  PHQ- 9 Score 0 0 0 2 0 0 0    Fall Risk    07/24/2023    9:12 AM 06/29/2023    1:09 PM 04/27/2023   10:31 AM 02/28/2023   10:25 AM 12/22/2022    8:40 AM  Fall Risk   Falls in the past year? 0 0 1 1 1   Number falls in past yr: 0 0 1 1 1   Injury with Fall? 0 0 0 0 0  Risk for fall due to : No Fall Risks No Fall Risks History of fall(s)  No Fall Risks  Follow up Falls prevention discussed Falls evaluation completed Falls evaluation completed Falls evaluation completed Falls evaluation completed    MEDICARE RISK AT HOME: Medicare Risk at Home Any stairs in or around the home?: Yes If so, are there any without handrails?: No Home free of loose throw rugs in walkways, pet beds, electrical cords, etc?: Yes Adequate lighting in your home to reduce risk of falls?: Yes Life alert?: No Use of a cane, walker or w/c?: No Grab bars in the bathroom?: No Shower chair or bench in shower?: No Elevated toilet seat or a handicapped toilet?: No  TIMED UP AND GO:  Was the test performed?  Yes  Length of time to ambulate 10 feet: 8 sec Gait steady and fast without use of assistive device    Cognitive Function:        07/24/2023    9:13 AM 06/27/2022    8:46 AM 06/25/2021   10:03 AM 06/23/2021    9:40 AM 05/14/2020   10:37 AM  6CIT Screen  What Year? 0 points 0 points 0 points 0 points 0 points  What month? 0 points 0 points 0 points 0 points 0 points  What time? 0 points 0 points 0 points 0 points 0 points  Count back from 20 0 points 0 points 0 points 0 points 0 points  Months in reverse 0 points 4  points 4 points 0 points 0 points  Repeat phrase 0 points 0 points 10 points 0 points 2 points  Total Score 0 points 4 points 14 points 0 points 2 points    Immunizations Immunization History  Administered Date(s) Administered   Fluad Quad(high Dose 65+) 09/02/2019, 06/21/2020, 06/23/2021   Influenza, High Dose Seasonal PF 08/21/2017, 06/28/2018, 07/27/2022   PFIZER Comirnaty(Gray Top)Covid-19 Tri-Sucrose Vaccine 07/27/2022   PFIZER(Purple Top)SARS-COV-2 Vaccination 12/03/2019, 12/24/2019   Pneumococcal Conjugate-13 10/19/2013   Pneumococcal Polysaccharide-23 10/19/2014   Td 12/15/2009, 12/20/2020   Zoster Recombinant(Shingrix) 01/12/2021, 03/14/2021   Zoster, Live 10/19/2014    TDAP status: Up to date  Flu Vaccine status: Due, Education has been provided regarding the importance of this vaccine. Advised may receive this vaccine at local pharmacy or Health Dept. Aware to provide a copy of the vaccination record if obtained from local pharmacy or Health Dept. Verbalized acceptance and understanding.  Pneumococcal vaccine status: Up to date  Covid-19 vaccine status: Information provided on how to obtain vaccines.   Qualifies for Shingles Vaccine? No   Zostavax completed No   Shingrix Completed?: Yes  Screening Tests Health Maintenance  Topic Date Due   COVID-19 Vaccine (4 - 2023-24 season) 06/10/2023   INFLUENZA VACCINE  01/07/2024 (Originally 05/10/2023)   Fecal DNA (Cologuard)  06/28/2024   Medicare Annual Wellness (AWV)  07/23/2024   DTaP/Tdap/Td (3 - Tdap) 12/21/2030   Pneumonia Vaccine 23+ Years old  Completed   Hepatitis C Screening  Completed   Zoster Vaccines- Shingrix  Completed   HPV VACCINES  Aged Out    Health Maintenance  Health Maintenance Due  Topic Date Due   COVID-19 Vaccine (4 - 2023-24 season) 06/10/2023    Colorectal cancer screening: Type of screening: Cologuard. Completed 06/28/21. Repeat every 3 years  Lung Cancer Screening: (Low Dose CT Chest  recommended if Age 36-80 years, 20 pack-year currently smoking OR have quit w/in 15years.) does qualify. Scheduled for 07/14/23  Lung Cancer Screening Referral: n/a  Additional Screening:  Hepatitis C Screening: does not qualify; Completed 04/17/17  Vision Screening: Recommended annual ophthalmology exams for early detection of glaucoma and other disorders of the eye.  Dental Screening: Recommended annual dental exams for proper oral hygiene  Community Resource Referral / Chronic Care Management: CRR required this visit?  No   CCM required this visit?  No     Plan:     I have personally reviewed and noted the following in the patient's chart:   Medical and social history Use of alcohol, tobacco or illicit drugs  Current medications and supplements including opioid prescriptions. Patient is not currently taking opioid prescriptions. Functional ability and status Nutritional status Physical activity Advanced directives List of other physicians Hospitalizations, surgeries, and ER visits in previous 12  months Vitals Screenings to include cognitive, depression, and falls Referrals and appointments  In addition, I have reviewed and discussed with patient certain preventive protocols, quality metrics, and best practice recommendations. A written personalized care plan for preventive services as well as general preventive health recommendations were provided to patient.     Tora Kindred, CMA   07/24/2023   After Visit Summary: (In Person-Printed) AVS printed and given to the patient  Nurse Notes:  Scheduled for flu and covid vaccines at [facility] today Low dose lung CT scan scheduled for 08/14/23

## 2023-08-14 ENCOUNTER — Ambulatory Visit
Admission: RE | Admit: 2023-08-14 | Discharge: 2023-08-14 | Disposition: A | Discharge status: Home / Self Care | Payer: 59 | Source: Ambulatory Visit | Attending: Acute Care | Admitting: Acute Care

## 2023-08-14 DIAGNOSIS — F17210 Nicotine dependence, cigarettes, uncomplicated: Secondary | ICD-10-CM | POA: Diagnosis not present

## 2023-08-14 DIAGNOSIS — Z122 Encounter for screening for malignant neoplasm of respiratory organs: Secondary | ICD-10-CM | POA: Diagnosis not present

## 2023-08-14 DIAGNOSIS — Z87891 Personal history of nicotine dependence: Secondary | ICD-10-CM

## 2023-08-19 ENCOUNTER — Other Ambulatory Visit: Payer: Self-pay | Admitting: Nurse Practitioner

## 2023-08-21 NOTE — Telephone Encounter (Signed)
Requested Prescriptions  Pending Prescriptions Disp Refills   ANORO ELLIPTA 62.5-25 MCG/ACT AEPB [Pharmacy Med Name: ANORO ELLIPTA 62.5-25 MCG INH] 60 each 4    Sig: INHALE 1 PUFF INTO THE LUNGS DAILY AT 6 (SIX) AM.     Pulmonology:  Combination Products Passed - 08/19/2023  9:19 AM      Passed - Valid encounter within last 12 months    Recent Outpatient Visits           1 month ago Centrilobular emphysema (HCC)   [place] [facility] [place], [place] T, NP   3 months ago Centrilobular emphysema (HCC)   [place] [facility] [place], Corrie Dandy T, NP   5 months ago Centrilobular emphysema (HCC)   [place] [facility] [place], Corrie Dandy T, NP   5 months ago Palpitations   [place] [facility] [place], [place] T, NP   8 months ago Centrilobular emphysema (HCC)   [place] [facility] [place], Dorie Rank, NP       Future Appointments             In 4 months [place], Dorie Rank, NP [facility], [facility]

## 2023-09-12 ENCOUNTER — Other Ambulatory Visit: Payer: Self-pay | Admitting: Acute Care

## 2023-09-12 DIAGNOSIS — Z87891 Personal history of nicotine dependence: Secondary | ICD-10-CM

## 2023-09-12 DIAGNOSIS — Z122 Encounter for screening for malignant neoplasm of respiratory organs: Secondary | ICD-10-CM

## 2023-10-17 DIAGNOSIS — H35033 Hypertensive retinopathy, bilateral: Secondary | ICD-10-CM | POA: Diagnosis not present

## 2023-10-17 DIAGNOSIS — H02884 Meibomian gland dysfunction left upper eyelid: Secondary | ICD-10-CM | POA: Diagnosis not present

## 2023-10-17 DIAGNOSIS — H02883 Meibomian gland dysfunction of right eye, unspecified eyelid: Secondary | ICD-10-CM | POA: Diagnosis not present

## 2023-10-17 DIAGNOSIS — H2513 Age-related nuclear cataract, bilateral: Secondary | ICD-10-CM | POA: Diagnosis not present

## 2023-10-17 DIAGNOSIS — H40013 Open angle with borderline findings, low risk, bilateral: Secondary | ICD-10-CM | POA: Diagnosis not present

## 2023-10-17 DIAGNOSIS — G43109 Migraine with aura, not intractable, without status migrainosus: Secondary | ICD-10-CM | POA: Diagnosis not present

## 2023-11-15 ENCOUNTER — Encounter: Payer: Self-pay | Admitting: Nurse Practitioner

## 2023-11-15 ENCOUNTER — Ambulatory Visit (INDEPENDENT_AMBULATORY_CARE_PROVIDER_SITE_OTHER): Payer: 59 | Admitting: Nurse Practitioner

## 2023-11-15 ENCOUNTER — Ambulatory Visit
Admission: RE | Admit: 2023-11-15 | Discharge: 2023-11-15 | Disposition: A | Discharge status: Home / Self Care | Payer: 59 | Source: Ambulatory Visit | Attending: Nurse Practitioner | Admitting: Nurse Practitioner

## 2023-11-15 ENCOUNTER — Ambulatory Visit
Admission: RE | Admit: 2023-11-15 | Discharge: 2023-11-15 | Disposition: A | Discharge status: Home / Self Care | Payer: 59 | Attending: Nurse Practitioner | Admitting: Nurse Practitioner

## 2023-11-15 VITALS — BP 128/72 | HR 61 | Temp 97.9°F | Ht 69.0 in | Wt 206.0 lb

## 2023-11-15 DIAGNOSIS — R0789 Other chest pain: Secondary | ICD-10-CM

## 2023-11-15 DIAGNOSIS — J432 Centrilobular emphysema: Secondary | ICD-10-CM | POA: Diagnosis not present

## 2023-11-15 DIAGNOSIS — R109 Unspecified abdominal pain: Secondary | ICD-10-CM | POA: Insufficient documentation

## 2023-11-15 LAB — URINALYSIS, ROUTINE W REFLEX MICROSCOPIC
Bilirubin, UA: NEGATIVE
Glucose, UA: NEGATIVE
Ketones, UA: NEGATIVE
Leukocytes,UA: NEGATIVE
Nitrite, UA: NEGATIVE
Protein,UA: NEGATIVE
RBC, UA: NEGATIVE
Specific Gravity, UA: 1.01 (ref 1.005–1.030)
Urobilinogen, Ur: 0.2 mg/dL (ref 0.2–1.0)
pH, UA: 6.5 (ref 5.0–7.5)

## 2023-11-15 MED ORDER — AMOXICILLIN-POT CLAVULANATE 875-125 MG PO TABS: 1.0000 | ORAL_TABLET | Freq: Two times a day (BID) | ORAL | 0 refills | Status: DC

## 2023-11-15 MED ORDER — TIZANIDINE HCL 4 MG PO TABS: 4.0000 mg | ORAL_TABLET | Freq: Four times a day (QID) | ORAL | 1 refills | Status: DC | PRN

## 2023-11-15 MED ORDER — PREDNISONE 20 MG PO TABS: 40.0000 mg | ORAL_TABLET | Freq: Every day | ORAL | 0 refills | Status: AC

## 2023-11-15 NOTE — Assessment & Plan Note (Signed)
 Chronic, with concern for PNA vs exacerbation.  Will obtain chest imaging and start Prednisone  40 MG daily for 5 days and Augmentin  BID for 7 days.  Educated him on this.  Spirometry September 2023 FEV1 94% and FEV1/FVC 118%.  Continue Anoro daily and continue Albuterol  as needed, recommend he use this as needed for any SOB.  Continue annual CT lung screens until age 75.  Recommend continue cessation of smoking.  Spirometry needed at next visit.

## 2023-11-15 NOTE — Progress Notes (Signed)
 Please let William Murray know no pneumonia is seen on imaging, however I do think he is having an exacerbation of his emphysema and do want him to continue with our current plan of care: steroids and antibiotic.  If any worsening alert me ASAP or go to ER. Any questions? Keep being stellar!!  Thank you for allowing me to participate in your care.  I appreciate you. Kindest regards, Rosario Kushner

## 2023-11-15 NOTE — Patient Instructions (Signed)

## 2023-11-15 NOTE — Assessment & Plan Note (Signed)
 Refer to emphysema plan of care.

## 2023-11-15 NOTE — Assessment & Plan Note (Signed)
 Pain more to right lower back.  No rashes noted or red flags on exam.  Will check UA.  Suspect more muscle pull related to recent coughing.  Refills on Tizanidine  sent and obtaining lung imaging to further assess for possible PNA.

## 2023-11-15 NOTE — Progress Notes (Signed)
 BP 128/72 (BP Location: Left Arm, Patient Position: Sitting, Cuff Size: Normal)   Pulse 61   Temp 97.9 F (36.6 C) (Oral)   Ht 5' 9 (1.753 m)   Wt 206 lb (93.4 kg)   SpO2 91%   BMI 30.42 kg/m    Subjective:    Patient ID: William Murray, male    DOB: 08/22/1951, 73 y.o.   MRN: [id number]  HPI: William Murray is a 73 y.o. male  Chief Complaint  Patient presents with   Flank Pain    Patient states he has been noticing a pain in his R lower back area around to the front. States he mainly feels the pain when he goes to get up from a sitting to standing position. States he has been having this pain for the last 3 months.    RIGHT SIDE PAIN Started about one month ago with right lower back pain and some discomfort in chest with deep breathing.  Prior to this starting he did have a cough/cold, no fever at the time.  Yesterday the right lower back pain was the worse, today it is better.  Denies SOB or palpitations.  Does have a history of kidney stone x 1 episode in his 20's.  He has cut back on alcohol, last 3-4 days has not had any liquor.  Has underlying emphysema. Duration: weeks Mechanism of injury: no trauma Location: Right and low back Onset: sudden Severity: 3/10 current, at worse is a 10/10 Quality: sharp, aching, and throbbing Frequency: constant to lower back and intermittent with chest tightness Radiation: none Aggravating factors: getting up from sitting, for chest a deep breath Alleviating factors: nothing Status: stable Treatments attempted: rest and APAP Relief with NSAIDs?: none taken Nighttime pain:  no Paresthesias / decreased sensation:  no Bowel / bladder incontinence:  no Fevers:  no Dysuria / urinary frequency:  no   Relevant past medical, surgical, family and social history reviewed and updated as indicated. Interim medical history since our last visit reviewed. Allergies and medications reviewed and updated.  Review of Systems  Constitutional:   Negative for activity change, appetite change, fatigue and fever.  HENT:  Positive for congestion, postnasal drip and rhinorrhea. Negative for ear discharge, ear pain, sinus pressure, sinus pain and sore throat.   Respiratory:  Positive for cough and chest tightness. Negative for shortness of breath and wheezing.   Cardiovascular:  Negative for chest pain, palpitations and leg swelling.  Gastrointestinal: Negative.   Endocrine: Negative.   Genitourinary:  Negative for dysuria, frequency, hematuria and urgency.  Neurological: Negative.   Psychiatric/Behavioral: Negative.      Per HPI unless specifically indicated above     Objective:    BP 128/72 (BP Location: Left Arm, Patient Position: Sitting, Cuff Size: Normal)   Pulse 61   Temp 97.9 F (36.6 C) (Oral)   Ht 5' 9 (1.753 m)   Wt 206 lb (93.4 kg)   SpO2 91%   BMI 30.42 kg/m   Wt Readings from Last 3 Encounters:  11/15/23 206 lb (93.4 kg)  08/14/23 201 lb (91.2 kg)  07/24/23 201 lb 3.2 oz (91.3 kg)    Physical Exam Vitals and nursing note reviewed.  Constitutional:      General: He is awake. He is not in acute distress.    Appearance: He is well-developed and well-groomed. He is obese. He is not ill-appearing or toxic-appearing.  HENT:     Head: Normocephalic.     Right  Ear: Hearing, tympanic membrane, ear canal and external ear normal.     Left Ear: Hearing, tympanic membrane, ear canal and external ear normal.     Nose: Nose normal.     Right Sinus: No maxillary sinus tenderness or frontal sinus tenderness.     Left Sinus: No maxillary sinus tenderness or frontal sinus tenderness.     Mouth/Throat:     Mouth: Mucous membranes are moist.     Pharynx: Posterior oropharyngeal erythema (mild) present. No pharyngeal swelling or oropharyngeal exudate.  Eyes:     General: Lids are normal.     Extraocular Movements: Extraocular movements intact.     Conjunctiva/sclera: Conjunctivae normal.  Neck:     Thyroid : No  thyromegaly.     Vascular: No carotid bruit.  Cardiovascular:     Rate and Rhythm: Normal rate and regular rhythm.     Heart sounds: Normal heart sounds. No murmur heard.    No gallop.  Pulmonary:     Effort: Pulmonary effort is normal. No accessory muscle usage or respiratory distress.     Breath sounds: Wheezing and rales present. No decreased breath sounds or rhonchi.     Comments: Expiratory wheezes throughout lung fields and rales to lower bases. Abdominal:     General: Bowel sounds are normal. There is no distension.     Palpations: Abdomen is soft.     Tenderness: There is no abdominal tenderness. There is no right CVA tenderness or left CVA tenderness.  Musculoskeletal:     Cervical back: Full passive range of motion without pain.     Lumbar back: Tenderness (right side) present. No swelling or spasms. Normal range of motion. Negative right straight leg raise test and negative left straight leg raise test.     Right lower leg: No edema.     Left lower leg: No edema.  Lymphadenopathy:     Cervical: No cervical adenopathy.  Skin:    General: Skin is warm.     Capillary Refill: Capillary refill takes less than 2 seconds.  Neurological:     Mental Status: He is alert and oriented to person, place, and time.     Deep Tendon Reflexes: Reflexes are normal and symmetric.     Reflex Scores:      Brachioradialis reflexes are 2+ on the right side and 2+ on the left side.      Patellar reflexes are 2+ on the right side and 2+ on the left side. Psychiatric:        Attention and Perception: Attention normal.        Mood and Affect: Mood normal.        Speech: Speech normal.        Behavior: Behavior normal. Behavior is cooperative.        Thought Content: Thought content normal.     Results for orders placed or performed in visit on 06/29/23  Uric acid   Collection Time: 06/29/23  1:28 PM  Result Value Ref Range   Uric Acid 4.6 3.8 - 8.4 mg/dL  CBC with Differential/Platelet    Collection Time: 06/29/23  1:28 PM  Result Value Ref Range   WBC 8.6 3.4 - 10.8 x10E3/uL   RBC 4.78 4.14 - 5.80 x10E6/uL   Hemoglobin 16.2 13.0 - 17.7 g/dL   Hematocrit 52.6 62.4 - 51.0 %   MCV 99 (H) 79 - 97 fL   MCH 33.9 (H) 26.6 - 33.0 pg   MCHC 34.2 31.5 - 35.7  g/dL   RDW 87.3 88.3 - 84.5 %   Platelets 194 150 - 450 x10E3/uL   Neutrophils 52 Not Estab. %   Lymphs 37 Not Estab. %   Monocytes 8 Not Estab. %   Eos 2 Not Estab. %   Basos 1 Not Estab. %   Neutrophils Absolute 4.5 1.4 - 7.0 x10E3/uL   Lymphocytes Absolute 3.1 0.7 - 3.1 x10E3/uL   Monocytes Absolute 0.7 0.1 - 0.9 x10E3/uL   EOS (ABSOLUTE) 0.2 0.0 - 0.4 x10E3/uL   Basophils Absolute 0.0 0.0 - 0.2 x10E3/uL   Immature Granulocytes 0 Not Estab. %   Immature Grans (Abs) 0.0 0.0 - 0.1 x10E3/uL  Comprehensive metabolic panel   Collection Time: 06/29/23  1:28 PM  Result Value Ref Range   Glucose 136 (H) 70 - 99 mg/dL   BUN 15 8 - 27 mg/dL   Creatinine, Ser 9.13 0.76 - 1.27 mg/dL   eGFR 91 >40 fO/fpw/8.26   BUN/Creatinine Ratio 17 10 - 24   Sodium 141 134 - 144 mmol/L   Potassium 3.9 3.5 - 5.2 mmol/L   Chloride 105 96 - 106 mmol/L   CO2 20 20 - 29 mmol/L   Calcium  9.6 8.6 - 10.2 mg/dL   Total Protein 7.3 6.0 - 8.5 g/dL   Albumin 4.3 3.8 - 4.8 g/dL   Globulin, Total 3.0 1.5 - 4.5 g/dL   Bilirubin Total 0.5 0.0 - 1.2 mg/dL   Alkaline Phosphatase 90 44 - 121 IU/L   AST 34 0 - 40 IU/L   ALT 21 0 - 44 IU/L  Lipid Panel w/o Chol/HDL Ratio   Collection Time: 06/29/23  1:28 PM  Result Value Ref Range   Cholesterol, Total 124 100 - 199 mg/dL   Triglycerides 631 (H) 0 - 149 mg/dL   HDL 32 (L) >60 mg/dL   VLDL Cholesterol Cal 54 (H) 5 - 40 mg/dL   LDL Chol Calc (NIH) 38 0 - 99 mg/dL  Magnesium   Collection Time: 06/29/23  1:28 PM  Result Value Ref Range   Magnesium 1.8 1.6 - 2.3 mg/dL  TSH   Collection Time: 06/29/23  1:28 PM  Result Value Ref Range   TSH 3.230 0.450 - 4.500 uIU/mL  T4, free   Collection Time:  06/29/23  1:28 PM  Result Value Ref Range   Free T4 1.33 0.82 - 1.77 ng/dL      Assessment & Plan:   Problem List Items Addressed This Visit       Respiratory   Centrilobular emphysema (HCC) - Primary   Chronic, with concern for PNA vs exacerbation.  Will obtain chest imaging and start Prednisone  40 MG daily for 5 days and Augmentin  BID for 7 days.  Educated him on this.  Spirometry September 2023 FEV1 94% and FEV1/FVC 118%.  Continue Anoro daily and continue Albuterol  as needed, recommend he use this as needed for any SOB.  Continue annual CT lung screens until age 58.  Recommend continue cessation of smoking.  Spirometry needed at next visit.      Relevant Medications   predniSONE  (DELTASONE ) 20 MG tablet     Other   Flank pain   Pain more to right lower back.  No rashes noted or red flags on exam.  Will check UA.  Suspect more muscle pull related to recent coughing.  Refills on Tizanidine  sent and obtaining lung imaging to further assess for possible PNA.      Relevant Orders   Urinalysis,  Routine w reflex microscopic   Sensation of chest tightness   Refer to emphysema plan of care.      Relevant Orders   DG Chest 2 View     Follow up plan: Return in about 1 week (around 11/22/2023) for Cough.

## 2023-11-22 ENCOUNTER — Other Ambulatory Visit: Payer: Self-pay | Admitting: Nurse Practitioner

## 2023-11-22 ENCOUNTER — Ambulatory Visit: Payer: 59 | Admitting: Nurse Practitioner

## 2023-11-23 NOTE — Telephone Encounter (Signed)
Requested medication (s) are due for refill today: see below  Requested medication (s) are on the active medication list: yes  Last refill:  11/15/23 #60/1  Future visit scheduled: yes  Notes to clinic:  not delegated, Pharmacy comment: REQUEST FOR 90 DAYS PRESCRIPTION.      Requested Prescriptions  Pending Prescriptions Disp Refills   tiZANidine (ZANAFLEX) 4 MG tablet [Pharmacy Med Name: TIZANIDINE HCL 4 MG TABLET] 360 tablet 1    Sig: TAKE 1 TABLET BY MOUTH EVERY 6 HOURS AS NEEDED FOR MUSCLE SPASMS.     Not Delegated - Cardiovascular:  Alpha-2 Agonists - tizanidine Failed - 11/23/2023 12:25 PM      Failed - This refill cannot be delegated      Passed - Valid encounter within last 6 months    Recent Outpatient Visits           4 months ago Centrilobular emphysema (HCC)   [place] [facility] [place], Corrie Dandy T, NP   7 months ago Centrilobular emphysema (HCC)   [place] [facility] [place], Corrie Dandy T, NP   8 months ago Centrilobular emphysema (HCC)   [place] [facility] [place], Corrie Dandy T, NP   8 months ago Palpitations   [place] [facility] [place], [place] T, NP   11 months ago Centrilobular emphysema [facility])   [place] [facility] [place], Dorie Rank, NP       Future Appointments             In 1 month [place], Dorie Rank, NP [place] [facility]

## 2023-12-16 DIAGNOSIS — Z1211 Encounter for screening for malignant neoplasm of colon: Secondary | ICD-10-CM | POA: Diagnosis not present

## 2023-12-16 DIAGNOSIS — Z1212 Encounter for screening for malignant neoplasm of rectum: Secondary | ICD-10-CM | POA: Diagnosis not present

## 2023-12-22 LAB — COLOGUARD: COLOGUARD: NEGATIVE

## 2023-12-22 LAB — EXTERNAL GENERIC LAB PROCEDURE: COLOGUARD: NEGATIVE

## 2023-12-23 NOTE — Patient Instructions (Signed)

## 2023-12-27 ENCOUNTER — Ambulatory Visit
Admission: RE | Admit: 2023-12-27 | Discharge: 2023-12-27 | Disposition: A | Discharge status: Home / Self Care | Source: Ambulatory Visit | Attending: Nurse Practitioner | Admitting: Nurse Practitioner

## 2023-12-27 ENCOUNTER — Ambulatory Visit
Admission: RE | Admit: 2023-12-27 | Discharge: 2023-12-27 | Disposition: A | Discharge status: Home / Self Care | Source: Home / Self Care | Attending: Nurse Practitioner | Admitting: Nurse Practitioner

## 2023-12-27 ENCOUNTER — Ambulatory Visit (INDEPENDENT_AMBULATORY_CARE_PROVIDER_SITE_OTHER): Payer: 59 | Admitting: Nurse Practitioner

## 2023-12-27 ENCOUNTER — Encounter: Payer: Self-pay | Admitting: Nurse Practitioner

## 2023-12-27 VITALS — BP 135/75 | HR 70 | Temp 98.4°F | Ht 69.0 in | Wt 202.4 lb

## 2023-12-27 DIAGNOSIS — G2581 Restless legs syndrome: Secondary | ICD-10-CM | POA: Diagnosis not present

## 2023-12-27 DIAGNOSIS — R002 Palpitations: Secondary | ICD-10-CM

## 2023-12-27 DIAGNOSIS — M47814 Spondylosis without myelopathy or radiculopathy, thoracic region: Secondary | ICD-10-CM | POA: Diagnosis not present

## 2023-12-27 DIAGNOSIS — I10 Essential (primary) hypertension: Secondary | ICD-10-CM

## 2023-12-27 DIAGNOSIS — I700 Atherosclerosis of aorta: Secondary | ICD-10-CM | POA: Diagnosis not present

## 2023-12-27 DIAGNOSIS — M545 Low back pain, unspecified: Secondary | ICD-10-CM

## 2023-12-27 DIAGNOSIS — Z87891 Personal history of nicotine dependence: Secondary | ICD-10-CM

## 2023-12-27 DIAGNOSIS — M4807 Spinal stenosis, lumbosacral region: Secondary | ICD-10-CM | POA: Diagnosis not present

## 2023-12-27 DIAGNOSIS — J432 Centrilobular emphysema: Secondary | ICD-10-CM

## 2023-12-27 DIAGNOSIS — E7800 Pure hypercholesterolemia, unspecified: Secondary | ICD-10-CM

## 2023-12-27 DIAGNOSIS — E038 Other specified hypothyroidism: Secondary | ICD-10-CM

## 2023-12-27 DIAGNOSIS — M47816 Spondylosis without myelopathy or radiculopathy, lumbar region: Secondary | ICD-10-CM | POA: Diagnosis not present

## 2023-12-27 DIAGNOSIS — G8929 Other chronic pain: Secondary | ICD-10-CM | POA: Insufficient documentation

## 2023-12-27 DIAGNOSIS — F1010 Alcohol abuse, uncomplicated: Secondary | ICD-10-CM

## 2023-12-27 DIAGNOSIS — M48061 Spinal stenosis, lumbar region without neurogenic claudication: Secondary | ICD-10-CM | POA: Diagnosis not present

## 2023-12-27 DIAGNOSIS — M549 Dorsalgia, unspecified: Secondary | ICD-10-CM | POA: Diagnosis not present

## 2023-12-27 LAB — URINALYSIS, ROUTINE W REFLEX MICROSCOPIC
Bilirubin, UA: NEGATIVE
Glucose, UA: NEGATIVE
Ketones, UA: NEGATIVE
Leukocytes,UA: NEGATIVE
Nitrite, UA: NEGATIVE
Protein,UA: NEGATIVE
Specific Gravity, UA: >1.03 — ABNORMAL HIGH (ref 1.005–1.030)
Urobilinogen, Ur: 0.2 mg/dL (ref 0.2–1.0)
pH, UA: 6 (ref 5.0–7.5)

## 2023-12-27 LAB — MICROSCOPIC EXAMINATION

## 2023-12-27 MED ORDER — LIDOCAINE 5 % EX PTCH: 1.0000 | MEDICATED_PATCH | CUTANEOUS | 0 refills | Status: DC

## 2023-12-27 NOTE — Assessment & Plan Note (Signed)
 Continue cessation of smoking and yearly CT scan until age 73 .  AAA screening up to date.

## 2023-12-27 NOTE — Assessment & Plan Note (Signed)
 Ongoing, often nightly per his wife, although occasionally has nights without use.  Bourbon 4-5 shots, but has cut back.  Recommend he cut back further on liquor use and discussed physiology of liver with aging, that body not always able to tolerate intake as well.  Reports drinking alcohol since a young age.

## 2023-12-27 NOTE — Assessment & Plan Note (Signed)
Stable with treatment of thyroid and continued magnesium supplement, continue current treatments and adjust as needed.

## 2023-12-27 NOTE — Assessment & Plan Note (Signed)
 Chronic, ongoing.  Diagnosed 08/05/21.  Continue current medication regimen and adjust as needed.  Thyroid labs up to date.

## 2023-12-27 NOTE — Assessment & Plan Note (Signed)
 Chronic, stable.  BP at goal for age in office on recheck and at home.  Recommend he monitor BP at least a few mornings a week at home and document.  DASH diet at home.  Continue current medication regimen and adjust as needed.  Labs today: CMP.

## 2023-12-27 NOTE — Assessment & Plan Note (Signed)
Ongoing on imaging.  Continue Lipitor and ASA daily for prevention.  Continue complete cessation of smoking.  Continue yearly lung CT scans.

## 2023-12-27 NOTE — Assessment & Plan Note (Signed)
 Chronic, ongoing.  Continue current medication regimen and adjust as needed. Lipid panel today.

## 2023-12-27 NOTE — Progress Notes (Signed)
 BP 135/75   Pulse 70   Temp 98.4 F (36.9 C) (Oral)   Ht 5\' 9"  (1.753 m)   Wt 202 lb 6.4 oz (91.8 kg)   SpO2 98%   BMI 29.89 kg/m    Subjective:    Patient ID: William Murray, male    DOB: 20-May-1951, 73 y.o.   MRN: [id number]  HPI: William Murray is a 73 y.o. male  Chief Complaint  Patient presents with   Hypertension   Hyperlipidemia   Hypothyroidism   COPD   Gout   HYPERTENSION Taking Losartan 100 MG, Metoprolol XL 25 MG daily, Amlodipine 5 MG daily, and Atorvastatin 10 MG daily. Continues to drink 1-2 alcohol drinks every night, but this is getting less. Calcifications aortic valve on past lung screening, he saw cardiology 03/13/23 and Myo performed noting low risk.  Echo noted mild aortic valve stenosis. Hypertension status: stable  Satisfied with current treatment? 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 Lung screening was 08/14/23 = mild centrilobular emphysema, aortic atherosclerosis + CAD.  Recent cough from February has improved.  Continues to have occasional right lower back pain -- comes and goes.  Varies when it presents, sometimes when getting up notices it and will catch.  Tizanidine made him too tired.  Pain comes daily and lasts for 5-10 minutes.  Sharp and brief pain.   Is using  Anoro and Albuterol.  Has history of smoking, quit several years ago -- smoked about 20 years heavily.  Reports he rarely needs to use Albuterol. COPD status: controlled Satisfied with current treatment?:  yes Oxygen use: no Dyspnea frequency:  none Cough frequency: none Rescue inhaler frequency: since starting Anoro rarely uses Limitation of activity: no Productive cough:  none Last Spirometry: 12/27/23 FEV1 93% and FEV1/FVC 115% Pneumovax: Up to Date Influenza: Up to Date   HYPOTHYROIDISM Takes Levothyroxine 50 MCG daily + mag supplement due to history of low levels and to treat RLS. Thyroid control status:stable Satisfied with current treatment? yes Medication side effects: no Medication compliance: good compliance Etiology of hypothyroidism: unknown Recent dose adjustment:no Fatigue: no Cold intolerance: no Heat intolerance: no Weight gain: no Weight loss: no Constipation: no Diarrhea/loose stools: no Palpitations: no Lower extremity edema: occasional Anxiety/depressed mood: no      12/27/2023    9:40 AM 07/24/2023    9:08 AM 06/29/2023    1:09 PM 04/27/2023   10:31 AM 02/28/2023   10:26 AM  Depression screen PHQ 2/9  Decreased Interest 0 0 0 0 1  Down, Depressed, Hopeless 0 0 0 0 0  PHQ - 2 Score 0 0 0 0 1  Altered sleeping 0 0 0 0 0  Tired, decreased energy 0 0 0 0 1  Change in appetite 0 0 0 0 0  Feeling bad or failure about yourself  0 0 0 0 0  Trouble concentrating 0 0 0 0 0  Moving slowly or fidgety/restless 0 0 0 0 0  Suicidal thoughts 0 0 0 0 0  PHQ-9 Score 0 0  0 0 2  Difficult doing work/chores Not difficult at all Not difficult at all Not difficult at all Not difficult at all Not difficult at all       12/27/2023    9:40 AM 06/29/2023    1:09 PM 04/27/2023   10:32 AM 02/28/2023   10:26 AM  GAD 7 : Generalized Anxiety Score  Nervous, Anxious, on Edge 0 0 0 0  Control/stop worrying 0 0 0 0  Worry too much - different things 0 0 0 0  Trouble relaxing 0 0 0 0  Restless 0 0 0 0  Easily annoyed or irritable 0 0 0 0  Afraid - awful might happen 0 0 0 0  Total GAD 7 Score 0 0 0 0  Anxiety Difficulty Not difficult at all Not difficult at all Not difficult  at all Not difficult at all   Relevant past medical, surgical, family and social history reviewed and updated as indicated. Interim medical history since our last visit reviewed. Allergies and medications reviewed and updated.  Review of Systems  Constitutional:  Negative for activity change, diaphoresis, fatigue and fever.  Respiratory:  Negative for cough, chest tightness, shortness of breath and wheezing.   Cardiovascular:  Positive for leg swelling (occasional). Negative for chest pain and palpitations.  Gastrointestinal: Negative.   Musculoskeletal:  Positive for arthralgias. Negative for joint swelling.  Neurological: Negative.   Psychiatric/Behavioral: Negative.      Per HPI unless specifically indicated above     Objective:    BP 135/75   Pulse 70   Temp 98.4 F (36.9 C) (Oral)   Ht 5\' 9"  (1.753 m)   Wt 202 lb 6.4 oz (91.8 kg)   SpO2 98%   BMI 29.89 kg/m   Wt Readings from Last 3 Encounters:  12/27/23 202 lb 6.4 oz (91.8 kg)  11/15/23 206 lb (93.4 kg)  08/14/23 201 lb (91.2 kg)    Physical Exam Vitals and nursing note reviewed.  Constitutional:      General: He is awake. He is not in acute distress.    Appearance: He is well-developed and well-groomed. He is not ill-appearing or toxic-appearing.  HENT:     Head: Normocephalic.     Right Ear: Hearing and external ear normal.     Left Ear: Hearing and external ear normal.  Eyes:     General: Lids are normal.     Extraocular Movements: Extraocular movements intact.     Conjunctiva/sclera: Conjunctivae normal.  Neck:     Thyroid: No thyromegaly.     Vascular: No carotid bruit.  Cardiovascular:     Rate and Rhythm: Normal rate and regular rhythm.     Heart sounds: Normal heart sounds. No murmur heard.    No gallop.  Pulmonary:     Effort: Pulmonary effort is normal. No accessory muscle usage or respiratory distress.     Breath sounds: Normal breath sounds. No decreased breath sounds, wheezing or rales.   Abdominal:     General: Bowel sounds are normal. There is no distension.     Palpations: Abdomen is soft.     Tenderness: There is no abdominal tenderness.  Musculoskeletal:     Cervical back: Full passive range of motion without pain.     Thoracic back: Normal.     Lumbar back: No swelling, spasms or tenderness. Normal range of motion. Negative right straight leg raise test and negative left straight leg raise test.       Back:  Right lower leg: No edema.     Left lower leg: No edema.     Comments: No rashes  Lymphadenopathy:     Cervical: No cervical adenopathy.  Skin:    General: Skin is warm.     Capillary Refill: Capillary refill takes less than 2 seconds.  Neurological:     Mental Status: He is alert and oriented to person, place, and time.     Deep Tendon Reflexes: Reflexes are normal and symmetric.     Reflex Scores:      Brachioradialis reflexes are 2+ on the right side and 2+ on the left side.      Patellar reflexes are 2+ on the right side and 2+ on the left side. Psychiatric:        Attention and Perception: Attention normal.        Mood and Affect: Mood normal.        Speech: Speech normal.        Behavior: Behavior normal. Behavior is cooperative.        Thought Content: Thought content normal.     Results for orders placed or performed in visit on 11/15/23  Urinalysis, Routine w reflex microscopic   Collection Time: 11/15/23  8:39 AM  Result Value Ref Range   Specific Gravity, UA 1.010 1.005 - 1.030   pH, UA 6.5 5.0 - 7.5   Color, UA Yellow Yellow   Appearance Ur Clear Clear   Leukocytes,UA Negative Negative   Protein,UA Negative Negative/Trace   Glucose, UA Negative Negative   Ketones, UA Negative Negative   RBC, UA Negative Negative   Bilirubin, UA Negative Negative   Urobilinogen, Ur 0.2 0.2 - 1.0 mg/dL   Nitrite, UA Negative Negative   Microscopic Examination Comment       Assessment & Plan:   Problem List Items Addressed This Visit        Cardiovascular and Mediastinum   Aortic atherosclerosis (HCC)   Ongoing on imaging.  Continue Lipitor and ASA daily for prevention.  Continue complete cessation of smoking.  Continue yearly lung CT scans.      Essential hypertension, benign   Chronic, stable.  BP at goal for age in office on recheck and at home.  Recommend he monitor BP at least a few mornings a week at home and document.  DASH diet at home.  Continue current medication regimen and adjust as needed.  Labs today: CMP.          Respiratory   Centrilobular emphysema (HCC) - Primary   Chronic and stable.  Last exacerbation February 2024, improved.  Continue current inhaler regimen as is offering him benefit to breathing, less SOB.  Spirometry today remains stable with FEV1 93% and FEV1/FVC 115%.  Continue cessation of smoking.      Relevant Orders   Spirometry with Graph (Completed)     Endocrine   Subclinical hypothyroidism   Chronic, ongoing.  Diagnosed 08/05/21.  Continue current medication regimen and adjust as needed.  Thyroid labs up to date.        Other   Alcohol abuse   Ongoing, often nightly per his wife, although occasionally has nights without use.  Bourbon 4-5 shots, but has cut back.  Recommend he cut back further on liquor use and discussed physiology of liver with aging, that body not always able to tolerate intake as well.  Reports drinking alcohol since a young age.      Chronic right-sided low back pain without  sciatica   Chronic, ongoing for months.  Will obtain imaging of thoracic spine and lumbar spine.  Recommend continue at home regimen. Will have return in 4 weeks.  No red flags.      Relevant Orders   DG Lumbar Spine Complete   Urinalysis, Routine w reflex microscopic   DG Thoracic Spine W/Swimmers   History of smoking   Continue cessation of smoking and yearly CT scan until age 6 .  AAA screening up to date.      Hypercholesteremia   Chronic, ongoing.  Continue current medication  regimen and adjust as needed.  Lipid panel today.      Relevant Orders   Comprehensive metabolic panel   Lipid Panel w/o Chol/HDL Ratio   Restless leg syndrome   Stable with treatment of thyroid and continued magnesium supplement, continue current treatments and adjust as needed.        Follow up plan: Return in about 4 weeks (around 01/24/2024) for RIGHT LOWER BACK PAIN.

## 2023-12-27 NOTE — Assessment & Plan Note (Signed)
 Chronic, ongoing for months.  Will obtain imaging of thoracic spine and lumbar spine.  Recommend continue at home regimen. Will have return in 4 weeks.  No red flags.

## 2023-12-27 NOTE — Assessment & Plan Note (Signed)
 Chronic and stable.  Last exacerbation February 2024, improved.  Continue current inhaler regimen as is offering him benefit to breathing, less SOB.  Spirometry today remains stable with FEV1 93% and FEV1/FVC 115%.  Continue cessation of smoking.

## 2023-12-28 LAB — COMPREHENSIVE METABOLIC PANEL
ALT: 22 IU/L (ref 0–44)
AST: 37 IU/L (ref 0–40)
Albumin: 4.6 g/dL (ref 3.8–4.8)
Alkaline Phosphatase: 108 IU/L (ref 44–121)
BUN/Creatinine Ratio: 18 (ref 10–24)
BUN: 18 mg/dL (ref 8–27)
Bilirubin Total: 0.7 mg/dL (ref 0.0–1.2)
CO2: 19 mmol/L — ABNORMAL LOW (ref 20–29)
Calcium: 9.3 mg/dL (ref 8.6–10.2)
Chloride: 107 mmol/L — ABNORMAL HIGH (ref 96–106)
Creatinine, Ser: 1.01 mg/dL (ref 0.76–1.27)
Globulin, Total: 3 g/dL (ref 1.5–4.5)
Glucose: 125 mg/dL — ABNORMAL HIGH (ref 70–99)
Potassium: 4.1 mmol/L (ref 3.5–5.2)
Sodium: 142 mmol/L (ref 134–144)
Total Protein: 7.6 g/dL (ref 6.0–8.5)
eGFR: 78 mL/min/{1.73_m2} (ref >59)

## 2023-12-28 LAB — LIPID PANEL W/O CHOL/HDL RATIO
Cholesterol, Total: 102 mg/dL (ref 100–199)
HDL: 35 mg/dL — ABNORMAL LOW (ref >39)
LDL Chol Calc (NIH): 41 mg/dL (ref 0–99)
Triglycerides: 151 mg/dL — ABNORMAL HIGH (ref 0–149)
VLDL Cholesterol Cal: 26 mg/dL (ref 5–40)

## 2023-12-28 NOTE — Progress Notes (Signed)
 Good afternoon, please let William Murray know all labs have returned: - Kidney function, creatinine and eGFR, remains normal, as is liver function, AST and ALT.  - Lipid panel shows stable levels with exception of triglycerides remaining elevated, continue to try cutting back on alcohol use and try taking an Omega 3 supplement daily. - Urine overall stable with no signs of infection.  Just a trace of blood.  If pain continues we could consider back imaging or kidney ultrasound.  Any questions? Keep being amazing!!  Thank you for allowing me to participate in your care.  I appreciate you. Kindest regards, Ellia Knowlton

## 2024-01-02 ENCOUNTER — Encounter: Payer: Self-pay | Admitting: Nurse Practitioner

## 2024-01-02 NOTE — Progress Notes (Signed)
 Contacted via MyChart   Good evening William Murray, your imaging has returned and does notice arthritis to multiple levels or mid back and lower back.  I suspect this may be what is causing you discomfort.  You may benefit some physical therapy time if interested in this.  Let me know:)

## 2024-01-21 ENCOUNTER — Ambulatory Visit: Admitting: Nurse Practitioner

## 2024-01-27 NOTE — Patient Instructions (Signed)
 Chronic Back Pain Chronic back pain is back pain that lasts longer than 3 months. The pain may get worse at certain times (flare-ups). There are things you can do at home to manage your pain. Follow these instructions at home: Watch for any changes in your symptoms. Take these actions to help with your pain: Managing pain and stiffness     If told, put ice on the painful area. You may be told to use ice for 24-48 hours after a flare-up starts. Put ice in a plastic bag. Place a towel between your skin and the bag. Leave the ice on for 20 minutes, 2-3 times a day. If told, put heat on the painful area. Do this as often as told by your doctor. Use the heat source that your doctor recommends, such as a moist heat pack or a heating pad. Place a towel between your skin and the heat source. Leave the heat on for 20-30 minutes. If your skin turns bright red, take off the ice or heat right away to prevent skin damage. The risk of damage is higher if you cannot feel pain, heat, or cold. Soak in a warm bath. This can help with pain. Activity        Avoid bending and other activities that make the pain worse. When you stand: Keep your upper back and neck straight. Keep your shoulders pulled back. Avoid slouching. When you sit: Keep your back straight. Relax your shoulders. Do not round your shoulders or pull them backward. Do not sit or stand in one place for too long. Take short rest breaks during the day. Lying down or standing is often better than sitting. Resting can help relieve pain. When sitting or lying down for a long time, do some mild activity or stretching. This will help to prevent stiffness and pain. Get regular exercise. Ask your doctor what activities are safe for you. You may have to avoid lifting. Ask your provider how much you can safely lift. If you lift things: Bend your knees. Keep the weight close to your body. Avoid twisting. Medicines Take over-the-counter and  prescription medicines only as told by your doctor. You may need to take medicines for pain and swelling. These may be taken by mouth or put on the skin. You may also be given muscle relaxants. Ask your doctor if the medicine prescribed to you: Requires you to avoid driving or using machinery. Can cause trouble pooping (constipation). You may need to take these actions to prevent or treat trouble pooping: Drink enough fluid to keep your pee (urine) pale yellow. Take over-the-counter or prescription medicines. Eat foods that are high in fiber. These include beans, whole grains, and fresh fruits and vegetables. Limit foods that are high in fat and sugars. These include fried or sweet foods. General instructions  Sleep on a firm mattress. Try lying on your side with your knees slightly bent. If you lie on your back, put a pillow under your knees. Do not smoke or use any products that contain nicotine or tobacco. If you need help quitting, ask your doctor. Contact a doctor if: Your pain does not get better with rest or medicine. You have new pain. You have a fever. You lose weight quickly. You have trouble doing your normal activities. One or both of your legs or feet feel weak. One or both of your legs or feet lose feeling (have numbness). Get help right away if: You are not able to control when you pee  or poop. You have bad back pain and: You feel like you may vomit (nauseous). You vomit. You have pain in your chest or your belly (abdomen). You have shortness of breath. You faint. These symptoms may be an emergency. Get help right away. Call 911. Do not wait to see if the symptoms will go away. Do not drive yourself to the hospital. This information is not intended to replace advice given to you by your health care provider. Make sure you discuss any questions you have with your health care provider. Document Revised: 05/15/2022 Document Reviewed: 05/15/2022 Elsevier Patient Education   2024 [employer].

## 2024-01-30 ENCOUNTER — Ambulatory Visit (INDEPENDENT_AMBULATORY_CARE_PROVIDER_SITE_OTHER): Admitting: Nurse Practitioner

## 2024-01-30 ENCOUNTER — Encounter: Payer: Self-pay | Admitting: Nurse Practitioner

## 2024-01-30 VITALS — BP 136/57 | HR 57 | Temp 97.6°F | Ht 69.0 in | Wt 205.0 lb

## 2024-01-30 DIAGNOSIS — G8929 Other chronic pain: Secondary | ICD-10-CM

## 2024-01-30 DIAGNOSIS — M5442 Lumbago with sciatica, left side: Secondary | ICD-10-CM | POA: Diagnosis not present

## 2024-01-30 MED ORDER — HYDROCODONE-ACETAMINOPHEN 5-325 MG PO TABS: 1.0000 | ORAL_TABLET | Freq: Four times a day (QID) | ORAL | 0 refills | Status: AC | PRN

## 2024-01-30 MED ORDER — PREDNISONE 10 MG PO TABS: ORAL_TABLET | ORAL | 0 refills | Status: DC

## 2024-01-30 NOTE — Assessment & Plan Note (Signed)
 Ongoing pain for >2 months with arthritic changes noted to thoracic and lumbar spine on imaging.  No red flags on exam.  No benefit from simple treatment.  Start Prednisone  taper and Norco 5-325 MG Q6H PRN -- he is aware to use only as needed for severe pain and not to take on schedule every 6 hours.  Educated him on this plan.  Referral to ortho for further recommendations and possible PT.

## 2024-01-30 NOTE — Progress Notes (Signed)
 BP (!) 136/57 (BP Location: Left Arm, Cuff Size: Normal)   Pulse (!) 57   Temp 97.6 F (36.4 C) (Oral)   Ht 5\' 9"  (1.753 m)   Wt 205 lb (93 kg)   SpO2 97%   BMI 30.27 kg/m    Subjective:    Patient ID: William Murray, male    DOB: [birth year], 73 y.o.   MRN: [id number]  HPI: William Murray is a 73 y.o. male  Chief Complaint  Patient presents with   Back Pain    4 week f/up- patient states his back pain has not gotten any better.    BACK PAIN Continues to have occasional right lower back pain -- comes and goes. Varies when it presents, sometimes when getting up notices it and will catch. Tizanidine  made him too tired. Pain comes daily and lasts for 5-10 minutes. Sharp and brief pain. Had imaging to thoracic and lumbar spine, both noting multilevel degeneration and narrowing. Duration: months Mechanism of injury: unknown Location: R>L and low back Onset: sudden Severity: 7/10 Quality: dull, aching, and throbbing Frequency: constant Radiation: L leg above the knee Aggravating factors: lifting, movement, walking, and bending Alleviating factors: Tylenol  Status: worse a little  Treatments attempted: APAP, Tizanidine , Lidocaine  patches  Relief with NSAIDs?: No NSAIDs Taken Nighttime pain:  yes Paresthesias / decreased sensation:  no Bowel / bladder incontinence:  no Fevers:  no Dysuria / urinary frequency:  no   Relevant past medical, surgical, family and social history reviewed and updated as indicated. Interim medical history since our last visit reviewed. Allergies and medications reviewed and updated.  Review of Systems  Constitutional:  Negative for activity change, diaphoresis, fatigue and fever.  Respiratory:  Negative for cough, chest tightness, shortness of breath and wheezing.   Cardiovascular:  Negative for chest pain, palpitations and leg swelling.  Gastrointestinal: Negative.   Musculoskeletal:  Positive for back pain.  Skin: Negative.   Neurological:  Negative.   Psychiatric/Behavioral: Negative.      Per HPI unless specifically indicated above     Objective:    BP (!) 136/57 (BP Location: Left Arm, Cuff Size: Normal)   Pulse (!) 57   Temp 97.6 F (36.4 C) (Oral)   Ht 5\' 9"  (1.753 m)   Wt 205 lb (93 kg)   SpO2 97%   BMI 30.27 kg/m   Wt Readings from Last 3 Encounters:  01/30/24 205 lb (93 kg)  12/27/23 202 lb 6.4 oz (91.8 kg)  11/15/23 206 lb (93.4 kg)    Physical Exam Vitals and nursing note reviewed.  Constitutional:      General: He is awake. He is not in acute distress.    Appearance: He is well-developed and well-groomed. He is obese. He is not ill-appearing or toxic-appearing.  HENT:     Head: Normocephalic.     Right Ear: Hearing and external ear normal.     Left Ear: Hearing and external ear normal.  Eyes:     General: Lids are normal.     Extraocular Movements: Extraocular movements intact.     Conjunctiva/sclera: Conjunctivae normal.  Neck:     Thyroid : No thyromegaly.     Vascular: No carotid bruit.  Cardiovascular:     Rate and Rhythm: Normal rate and regular rhythm.     Heart sounds: Normal heart sounds. No murmur heard.    No gallop.  Pulmonary:     Effort: No accessory muscle usage or respiratory distress.  Breath sounds: Normal breath sounds.  Abdominal:     General: Bowel sounds are normal. There is no distension.     Palpations: Abdomen is soft.     Tenderness: There is no abdominal tenderness.  Musculoskeletal:     Cervical back: Full passive range of motion without pain.     Lumbar back: Tenderness present. No swelling, spasms or bony tenderness. Decreased range of motion. Positive right straight leg raise test and positive left straight leg raise test.     Right lower leg: No edema.     Left lower leg: No edema.     Comments: Decreased flexion and extension.  Able to touch to knees only.  Lateral and rotation with minimal reduction in ROM, is having discomfort with movement to right  side.  No rashes noted to skin. Tenderness to lower back, R>L.  Lymphadenopathy:     Cervical: No cervical adenopathy.  Skin:    General: Skin is warm.     Capillary Refill: Capillary refill takes less than 2 seconds.  Neurological:     Mental Status: He is alert and oriented to person, place, and time.     Deep Tendon Reflexes: Reflexes are normal and symmetric.     Reflex Scores:      Brachioradialis reflexes are 2+ on the right side and 2+ on the left side.      Patellar reflexes are 2+ on the right side and 2+ on the left side. Psychiatric:        Attention and Perception: Attention normal.        Mood and Affect: Mood normal.        Speech: Speech normal.        Behavior: Behavior normal. Behavior is cooperative.        Thought Content: Thought content normal.    Results for orders placed or performed in visit on 12/27/23  Microscopic Examination   Collection Time: 12/27/23  9:32 AM   Urine  Result Value Ref Range   RBC, Urine 0-2 0 - 2 /hpf   Epithelial Cells (non renal) CANCELED   Comprehensive metabolic panel   Collection Time: 12/27/23  9:32 AM  Result Value Ref Range   Glucose 125 (H) 70 - 99 mg/dL   BUN 18 8 - 27 mg/dL   Creatinine, Ser 8.29 0.76 - 1.27 mg/dL   eGFR 78 >56 OZ/HYQ/6.57   BUN/Creatinine Ratio 18 10 - 24   Sodium 142 134 - 144 mmol/L   Potassium 4.1 3.5 - 5.2 mmol/L   Chloride 107 (H) 96 - 106 mmol/L   CO2 19 (L) 20 - 29 mmol/L   Calcium  9.3 8.6 - 10.2 mg/dL   Total Protein 7.6 6.0 - 8.5 g/dL   Albumin 4.6 3.8 - 4.8 g/dL   Globulin, Total 3.0 1.5 - 4.5 g/dL   Bilirubin Total 0.7 0.0 - 1.2 mg/dL   Alkaline Phosphatase 108 44 - 121 IU/L   AST 37 0 - 40 IU/L   ALT 22 0 - 44 IU/L  Lipid Panel w/o Chol/HDL Ratio   Collection Time: 12/27/23  9:32 AM  Result Value Ref Range   Cholesterol, Total 102 100 - 199 mg/dL   Triglycerides 846 (H) 0 - 149 mg/dL   HDL 35 (L) >96 mg/dL   VLDL Cholesterol Cal 26 5 - 40 mg/dL   LDL Chol Calc (NIH) 41 0 - 99  mg/dL  Urinalysis, Routine w reflex microscopic   Collection Time: 12/27/23  9:32  AM  Result Value Ref Range   Specific Gravity, UA >1.030 (H) 1.005 - 1.030   pH, UA 6.0 5.0 - 7.5   Color, UA Yellow Yellow   Appearance Ur Clear Clear   Leukocytes,UA Negative Negative   Protein,UA Negative Negative/Trace   Glucose, UA Negative Negative   Ketones, UA Negative Negative   RBC, UA Trace (A) Negative   Bilirubin, UA Negative Negative   Urobilinogen, Ur 0.2 0.2 - 1.0 mg/dL   Nitrite, UA Negative Negative   Microscopic Examination See below:       Assessment & Plan:   Problem List Items Addressed This Visit       Nervous and Auditory   Chronic bilateral low back pain with left-sided sciatica - Primary   Ongoing pain for >2 months with arthritic changes noted to thoracic and lumbar spine on imaging.  No red flags on exam.  No benefit from simple treatment.  Start Prednisone  taper and Norco 5-325 MG Q6H PRN -- he is aware to use only as needed for severe pain and not to take on schedule every 6 hours.  Educated him on this plan.  Referral to ortho for further recommendations and possible PT.        Relevant Medications   predniSONE  (DELTASONE ) 10 MG tablet   HYDROcodone -acetaminophen  (NORCO/VICODIN) 5-325 MG tablet   Other Relevant Orders   Ambulatory referral to Orthopedic Surgery     Follow up plan: Return in about 4 weeks (around 02/27/2024) for Back Pain.

## 2024-01-31 ENCOUNTER — Other Ambulatory Visit: Payer: Self-pay | Admitting: Nurse Practitioner

## 2024-02-01 NOTE — Telephone Encounter (Signed)
 Requested Prescriptions  Pending Prescriptions Disp Refills   ANORO ELLIPTA  62.5-25 MCG/ACT AEPB [Pharmacy Med Name: ANORO ELLIPTA  62.5-25 MCG INH] 60 each 4    Sig: INHALE 1 PUFF INTO THE LUNGS DAILY AT 6 (SIX) AM.     Pulmonology:  Combination Products Passed - 02/01/2024 10:26 AM      Passed - Valid encounter within last 12 months    Recent Outpatient Visits           2 days ago Chronic bilateral low back pain with left-sided sciatica   [place] [facility] [place], Sanjuan Crumbly T, NP   1 month ago Centrilobular emphysema (HCC)   [place] [facility] [place], Sanjuan Crumbly T, NP   2 months ago Centrilobular emphysema [facility])    [facility] [place], Lavelle Posey, NP

## 2024-02-24 NOTE — Patient Instructions (Incomplete)
 Chronic Back Pain Chronic back pain is back pain that lasts longer than 3 months. The pain may get worse at certain times (flare-ups). There are things you can do at home to manage your pain. Follow these instructions at home: Watch for any changes in your symptoms. Take these actions to help with your pain: Managing pain and stiffness     If told, put ice on the painful area. You may be told to use ice for 24-48 hours after a flare-up starts. Put ice in a plastic bag. Place a towel between your skin and the bag. Leave the ice on for 20 minutes, 2-3 times a day. If told, put heat on the painful area. Do this as often as told by your doctor. Use the heat source that your doctor recommends, such as a moist heat pack or a heating pad. Place a towel between your skin and the heat source. Leave the heat on for 20-30 minutes. If your skin turns bright red, take off the ice or heat right away to prevent skin damage. The risk of damage is higher if you cannot feel pain, heat, or cold. Soak in a warm bath. This can help with pain. Activity        Avoid bending and other activities that make the pain worse. When you stand: Keep your upper back and neck straight. Keep your shoulders pulled back. Avoid slouching. When you sit: Keep your back straight. Relax your shoulders. Do not round your shoulders or pull them backward. Do not sit or stand in one place for too long. Take short rest breaks during the day. Lying down or standing is often better than sitting. Resting can help relieve pain. When sitting or lying down for a long time, do some mild activity or stretching. This will help to prevent stiffness and pain. Get regular exercise. Ask your doctor what activities are safe for you. You may have to avoid lifting. Ask your provider how much you can safely lift. If you lift things: Bend your knees. Keep the weight close to your body. Avoid twisting. Medicines Take over-the-counter and  prescription medicines only as told by your doctor. You may need to take medicines for pain and swelling. These may be taken by mouth or put on the skin. You may also be given muscle relaxants. Ask your doctor if the medicine prescribed to you: Requires you to avoid driving or using machinery. Can cause trouble pooping (constipation). You may need to take these actions to prevent or treat trouble pooping: Drink enough fluid to keep your pee (urine) pale yellow. Take over-the-counter or prescription medicines. Eat foods that are high in fiber. These include beans, whole grains, and fresh fruits and vegetables. Limit foods that are high in fat and sugars. These include fried or sweet foods. General instructions  Sleep on a firm mattress. Try lying on your side with your knees slightly bent. If you lie on your back, put a pillow under your knees. Do not smoke or use any products that contain nicotine or tobacco. If you need help quitting, ask your doctor. Contact a doctor if: Your pain does not get better with rest or medicine. You have new pain. You have a fever. You lose weight quickly. You have trouble doing your normal activities. One or both of your legs or feet feel weak. One or both of your legs or feet lose feeling (have numbness). Get help right away if: You are not able to control when you pee  or poop. You have bad back pain and: You feel like you may vomit (nauseous). You vomit. You have pain in your chest or your belly (abdomen). You have shortness of breath. You faint. These symptoms may be an emergency. Get help right away. Call 911. Do not wait to see if the symptoms will go away. Do not drive yourself to the hospital. This information is not intended to replace advice given to you by your health care provider. Make sure you discuss any questions you have with your health care provider. Document Revised: 05/15/2022 Document Reviewed: 05/15/2022 Elsevier Patient Education   2024 [employer].

## 2024-02-27 ENCOUNTER — Ambulatory Visit: Admitting: Nurse Practitioner

## 2024-03-23 ENCOUNTER — Other Ambulatory Visit: Payer: Self-pay | Admitting: Nurse Practitioner

## 2024-03-25 NOTE — Telephone Encounter (Signed)
 Requested Prescriptions  Pending Prescriptions Disp Refills   famotidine  (PEPCID ) 20 MG tablet [Pharmacy Med Name: FAMOTIDINE  20 MG TABLET] 90 tablet 1    Sig: TAKE 1 TABLET BY MOUTH EVERYDAY AT BEDTIME     Gastroenterology:  H2 Antagonists Passed - 03/25/2024  4:05 PM      Passed - Valid encounter within last 12 months    Recent Outpatient Visits           1 month ago Chronic bilateral low back pain with left-sided sciatica   [place] [facility] [place], Sanjuan Crumbly T, NP   2 months ago Centrilobular emphysema (HCC)   [place] [facility] [place], Sanjuan Crumbly T, NP   4 months ago Centrilobular emphysema [facility])    [facility] [place], Lavelle Posey, NP

## 2024-04-16 DIAGNOSIS — H2513 Age-related nuclear cataract, bilateral: Secondary | ICD-10-CM | POA: Diagnosis not present

## 2024-04-16 DIAGNOSIS — H02884 Meibomian gland dysfunction left upper eyelid: Secondary | ICD-10-CM | POA: Diagnosis not present

## 2024-04-16 DIAGNOSIS — H35033 Hypertensive retinopathy, bilateral: Secondary | ICD-10-CM | POA: Diagnosis not present

## 2024-04-16 DIAGNOSIS — H353131 Nonexudative age-related macular degeneration, bilateral, early dry stage: Secondary | ICD-10-CM | POA: Diagnosis not present

## 2024-04-16 DIAGNOSIS — G43109 Migraine with aura, not intractable, without status migrainosus: Secondary | ICD-10-CM | POA: Diagnosis not present

## 2024-04-16 DIAGNOSIS — H40013 Open angle with borderline findings, low risk, bilateral: Secondary | ICD-10-CM | POA: Diagnosis not present

## 2024-05-14 ENCOUNTER — Other Ambulatory Visit: Payer: Self-pay | Admitting: Nurse Practitioner

## 2024-05-15 NOTE — Telephone Encounter (Signed)
 Requested medication (s) are due for refill today: yes  Requested medication (s) are on the active medication list: yes  Last refill:  11/23/23  Future visit scheduled: {Yes  Notes to clinic:  Unable to refill per protocol, cannot delegate.      Requested Prescriptions  Pending Prescriptions Disp Refills   tiZANidine  (ZANAFLEX ) 4 MG tablet [Pharmacy Med Name: TIZANIDINE  HCL 4 MG TABLET] 360 tablet 1    Sig: TAKE 1 TABLET BY MOUTH EVERY 6 HOURS AS NEEDED FOR MUSCLE SPASMS.     Not Delegated - Cardiovascular:  Alpha-2 Agonists - tizanidine  Failed - 05/15/2024  3:46 PM      Failed - This refill cannot be delegated      Passed - Valid encounter within last 6 months    Recent Outpatient Visits           3 months ago Chronic bilateral low back pain with left-sided sciatica   [place] [facility] [place], Melanie T, NP   4 months ago Centrilobular emphysema (HCC)   [place] [facility] [place], Melanie T, NP   6 months ago Centrilobular emphysema [facility])   [place] [facility] [place], Melanie DASEN, NP

## 2024-07-08 ENCOUNTER — Other Ambulatory Visit: Payer: Self-pay | Admitting: Nurse Practitioner

## 2024-07-09 NOTE — Telephone Encounter (Signed)
 Too soon for refill.  Requested Prescriptions  Pending Prescriptions Disp Refills   ANORO ELLIPTA  62.5-25 MCG/ACT AEPB [Pharmacy Med Name: ANORO ELLIPTA  62.5-25 MCG INH] 60 each 4    Sig: INHALE 1 PUFF INTO THE LUNGS DAILY AT 6 (SIX) AM.     Pulmonology:  Combination Products Passed - 07/09/2024  2:24 PM      Passed - Valid encounter within last 12 months    Recent Outpatient Visits           5 months ago Chronic bilateral low back pain with left-sided sciatica   [place] [facility] [place], Melanie T, NP   6 months ago Centrilobular emphysema (HCC)   [place] [facility] [place], Melanie T, NP   7 months ago Centrilobular emphysema [facility])    [facility] [place], Melanie DASEN, NP

## 2024-07-17 ENCOUNTER — Other Ambulatory Visit: Payer: Self-pay | Admitting: Nurse Practitioner

## 2024-07-21 NOTE — Telephone Encounter (Signed)
 Too soon for  refill, duplicate request.  Requested Prescriptions  Pending Prescriptions Disp Refills   ANORO ELLIPTA  62.5-25 MCG/ACT AEPB [Pharmacy Med Name: ANORO ELLIPTA  62.5-25 MCG INH] 60 each 4    Sig: INHALE 1 PUFF INTO THE LUNGS DAILY AT 6 (SIX) AM.     Pulmonology:  Combination Products Passed - 07/21/2024 11:08 AM      Passed - Valid encounter within last 12 months    Recent Outpatient Visits           5 months ago Chronic bilateral low back pain with left-sided sciatica   [place] [facility] [place], Melanie T, NP   6 months ago Centrilobular emphysema (HCC)   [place] [facility] [place], Melanie T, NP   8 months ago Centrilobular emphysema [facility])   [place] [facility] [place], Melanie DASEN, NP

## 2024-07-22 ENCOUNTER — Telehealth: Payer: Self-pay

## 2024-07-22 NOTE — Telephone Encounter (Signed)
 Copied from CRM [id number]. Topic: Clinical - Prescription Issue >> Jul 22, 2024 11:07 AM Lonell PEDLAR wrote: Reason for CRM: Patient is needing an rx refill for 2 medications, however, they are unable to recall the names/any information regarding meds. Patient stated that the pharmacy typically reaches out the the provider directly, but they have not heard anything.

## 2024-07-23 MED ORDER — UMECLIDINIUM-VILANTEROL 62.5-25 MCG/ACT IN AEPB: 1.0000 | INHALATION_SPRAY | Freq: Every day | RESPIRATORY_TRACT | 4 refills | Status: DC

## 2024-07-23 NOTE — Addendum Note (Signed)
 Addended by: Kaely Hollan M on: 07/23/2024 09:22 AM   Modules accepted: Orders

## 2024-07-31 ENCOUNTER — Encounter

## 2024-07-31 NOTE — Progress Notes (Unsigned)
 Cardiology Office Note:  .   Date:  08/01/2024  ID:  William Murray, DOB 06-23-1951, MRN [id number] PCP: Valerio Melanie DASEN, NP  [facility] Providers Cardiologist:  Evalene Lunger, MD {  History of Present Illness: .   William Murray is a 73 y.o. male with history of hypertension, former smoker, COPD     Chest pain Prior cardiac catheterization, results not available. 04/2015 normal stress test 02/2023 echo with preserved biventricular function, mild MR.  Mild aortic stenosis.  Mean gradient 8. 03/2023 reported chest tightness/shortness of breath.  Normal stress test  Ectopy 07/2020 monitor with 8 beats of VT.  82 beats of SVT.  Longest episode 13.1 seconds.  9.5% PVCs.  Symptomatic during PVCs.  Social history  Prior smoker of 20+ years No family history of CAD.  Has multiple siblings that have died due to cancer. Widowed.  But married for the second time.     Patient with chronic complaints of chest pain with reassuring stress test.  Seen 03/2023 with complaints of chest tightness and shortness of breath.  Myoview  ordered again that was normal.  Today patient presents for annual follow-up.  He has no acute concerns whatsoever.  Has not had any recurrence of his chest pain in over a year.  Denies any palpitations.  Still active.  He will walk 1 mile each day without any exertional symptoms.  Did not note any worsening fatigue or decrease in exercise capacity.  He is compliant with all of his medications and feeling well overall.  ROS: Denies: Chest pain, shortness of breath, orthopnea, peripheral edema, palpitations, decreased exercise intolerance, fatigue, lightheadedness.   Studies Reviewed: SABRA    EKG Interpretation Date/Time:  Friday August 01 2024 11:20:50 EDT Ventricular Rate:  70 PR Interval:  152 QRS Duration:  82 QT Interval:  382 QTC Calculation: 412 R Axis:   -7  Text Interpretation: Normal sinus rhythm Normal ECG When compared with ECG of 28-Apr-2015  10:25, No significant change since last tracing Confirmed by William Murray [id number]) on 08/01/2024 11:24:46 AM    Risk Assessment/Calculations:        Physical Exam:   VS:  BP 126/72 (BP Location: Left Arm, Patient Position: Sitting, Cuff Size: Normal)   Pulse 70   Ht 5' 9 (1.753 m)   Wt 205 lb (93 kg)   SpO2 97%   BMI 30.27 kg/m    Wt Readings from Last 3 Encounters:  08/01/24 205 lb (93 kg)  01/30/24 205 lb (93 kg)  12/27/23 202 lb 6.4 oz (91.8 kg)    GEN: Well nourished, well developed in no acute distress NECK: No JVD; No carotid bruits CARDIAC: RRR, no murmurs, rubs, gallops RESPIRATORY:  Clear to auscultation without rales, wheezing or rhonchi  ABDOMEN: Soft, non-tender, non-distended EXTREMITIES:  No edema; No deformity   ASSESSMENT AND PLAN: .    Chest pain - 03/2023 normal stress test No complaints over the past year with reassuring stress test in the past.  EKG is nonischemic today.  Continue to modify risk factors. Continue with aspirin , Toprol -XL 25 mg, atorvastatin  20 mg.  LDL 12/2023 was 41 and well-controlled.  Palpitations No longer an issue.  He has done well on beta-blocker therapy.  Continue Toprol -XL 25 mg.  Mitral regurgitation Echocardiogram 02/2023 with preserved biventricular function with mild to moderately dilated LA.  Mild MR.  Mild to moderate aortic regurgitation.  Without any exertional symptoms. Could consider repeating echocardiogram in 3 to 5 years.  Hypertension Good control 126/72 today. Continue amlodipine  5 mg, losartan  100 mg, Toprol -XL 25 mg.  Former smoker He does get scheduled CT lungs and has one coming up.   Dispo: 1 year follow-up with Dr. Gollan  Signed, Thom LITTIE Sluder, PA-C

## 2024-08-01 ENCOUNTER — Ambulatory Visit: Attending: Cardiology | Admitting: Cardiology

## 2024-08-01 ENCOUNTER — Encounter: Payer: Self-pay | Admitting: Cardiology

## 2024-08-01 VITALS — BP 126/72 | HR 70 | Ht 69.0 in | Wt 205.0 lb

## 2024-08-01 DIAGNOSIS — I700 Atherosclerosis of aorta: Secondary | ICD-10-CM

## 2024-08-01 DIAGNOSIS — I10 Essential (primary) hypertension: Secondary | ICD-10-CM | POA: Diagnosis not present

## 2024-08-01 NOTE — Patient Instructions (Signed)
 Medication Instructions: Your physician recommends that you continue on your current medications as directed. Please refer to the Current Medication list given to you today.   *If you need a refill on your cardiac medications before your next appointment, please call your pharmacy*  Lab Work: No labs ordered   If you have labs (blood work) drawn today and your tests are completely normal, you will receive your results only by: MyChart Message (if you have MyChart) OR A paper copy in the mail If you have any lab test that is abnormal or we need to change your treatment, we will call you to review the results.  Testing/Procedures: No testing ordered    Follow-Up: At [facility], you and your health needs are our priority.  As part of our continuing mission to provide you with exceptional heart care, our providers are all part of one team.  This team includes your primary Cardiologist (physician) and Advanced Practice Providers or APPs (Physician Assistants and Nurse Practitioners) who all work together to provide you with the care you need, when you need it.  Your next appointment:  12 months    Provider:   You may see Timothy Gollan, MD or one of the following Advanced Practice Providers on your designated Care Team:   Lonni Meager, NP Lesley Maffucci, PA-C Bernardino Bring, PA-C Cadence [place], PA-C Tylene Lunch, NP Barnie Hila, NP   We recommend signing up for the patient portal called MyChart.  Sign up information is provided on this After Visit Summary.  MyChart is used to connect with patients for Virtual Visits (Telemedicine).  Patients are able to view lab/test results, encounter notes, upcoming appointments, etc.  Non-urgent messages can be sent to your provider as well.   To learn more about what you can do with MyChart, go to [email or web address].

## 2024-08-04 NOTE — Progress Notes (Unsigned)
   08/04/2024  Patient ID: William Murray, male   DOB: 09/23/1951, 73 y.o.   MRN: [id number]  This patient is appearing on a report for being at risk of failing the adherence measure for hypertension (ACEi/ARB) medications this calendar year.   Medication: Losartan  100 mg tablets Last fill date: 03/23/24 for 90 day supply  MyChart message sent to patient.  Rorik Vespa C. Erick Murin [facility] PharmD Candidate Class of [id number]

## 2024-08-04 NOTE — Progress Notes (Unsigned)
   08/04/2024  Patient ID: William Murray, male   DOB: [birth year], 73 y.o.   MRN: [id number]  This patient is appearing on a report for being at risk of failing the adherence measure for cholesterol (statin) medications this calendar year.   Medication: atorvastatin  20 mg tablets Last fill date: 03/23/24 for 90 day supply  MyChart message sent to patient.   Devi Hopman C. Aydden Cumpian [facility] PharmD Candidate Class of [id number]

## 2024-08-14 ENCOUNTER — Ambulatory Visit
Admission: RE | Admit: 2024-08-14 | Discharge: 2024-08-14 | Disposition: A | Discharge status: Home / Self Care | Source: Ambulatory Visit | Attending: Acute Care | Admitting: Acute Care

## 2024-08-14 DIAGNOSIS — Z122 Encounter for screening for malignant neoplasm of respiratory organs: Secondary | ICD-10-CM | POA: Diagnosis present

## 2024-08-14 DIAGNOSIS — Z87891 Personal history of nicotine dependence: Secondary | ICD-10-CM | POA: Diagnosis present

## 2024-08-19 ENCOUNTER — Ambulatory Visit: Admitting: Emergency Medicine

## 2024-08-19 VITALS — BP 136/68 | Ht 69.0 in | Wt 205.6 lb

## 2024-08-19 DIAGNOSIS — Z0000 Encounter for general adult medical examination without abnormal findings: Secondary | ICD-10-CM

## 2024-08-19 NOTE — Patient Instructions (Signed)
 [name],  Thank you for taking the time for your Medicare Wellness Visit. I appreciate your continued commitment to your health goals. Please review the care plan we discussed, and feel free to reach out if I can assist you further.  Please note that Annual Wellness Visits do not include a physical exam. Some assessments may be limited, especially if the visit was conducted virtually. If needed, we may recommend an in-person follow-up with your provider.  Ongoing Care Seeing your primary care provider every 3 to 6 months helps us  monitor your health and provide consistent, personalized care.   Referrals If a referral was made during today's visit and you haven't received any updates within two weeks, please contact the referred provider directly to check on the status.  Recommended Screenings:  Get the flu and Covid vaccines at your convenience.  Health Maintenance  Topic Date Due   Flu Shot  05/09/2024   COVID-19 Vaccine (6 - 2025-26 season) 06/09/2024   Medicare Annual Wellness Visit  07/23/2024   Cologuard (Stool DNA test)  12/16/2026   DTaP/Tdap/Td vaccine (4 - Td or Tdap) 03/12/2034   Pneumococcal Vaccine for age over 69  Completed   Hepatitis C Screening  Completed   Zoster (Shingles) Vaccine  Completed   Meningitis B Vaccine  Aged Out       08/19/2024    3:11 PM  Advanced Directives  Does Patient Have a Medical Advance Directive? No  Would patient like information on creating a medical advance directive? No - Patient declined    Vision: Annual vision screenings are recommended for early detection of glaucoma, cataracts, and diabetic retinopathy. These exams can also reveal signs of chronic conditions such as diabetes and high blood pressure.  Dental: Annual dental screenings help detect early signs of oral cancer, gum disease, and other conditions linked to overall health, including heart disease and diabetes.  Please see the attached documents for additional  preventive care recommendations.   Fall Prevention in the Home, Adult Falls can cause injuries and affect people of all ages. There are many simple things that you can do to make your home safe and to help prevent falls. If you need it, ask for help making these changes. What actions can I take to prevent falls? General information Use good lighting in all rooms. Make sure to: Replace any light bulbs that burn out. Turn on lights if it is dark and use night-lights. Keep items that you use often in easy-to-reach places. Lower the shelves around your home if needed. Move furniture so that there are clear paths around it. Do not keep throw rugs or other things on the floor that can make you trip. If any of your floors are uneven, fix them. Add color or contrast paint or tape to clearly mark and help you see: Grab bars or handrails. First and last steps of staircases. Where the edge of each step is. If you use a ladder or stepladder: Make sure that it is fully opened. Do not climb a closed ladder. Make sure the sides of the ladder are locked in place. Have someone hold the ladder while you use it. Know where your pets are as you move through your home. What can I do in the bathroom?     Keep the floor dry. Clean up any water that is on the floor right away. Remove soap buildup in the bathtub or shower. Buildup makes bathtubs and showers slippery. Use non-skid mats or decals on the  floor of the bathtub or shower. Attach bath mats securely with double-sided, non-slip rug tape. If you need to sit down while you are in the shower, use a non-slip stool. Install grab bars by the toilet and in the bathtub and shower. Do not use towel bars as grab bars. What can I do in the bedroom? Make sure that you have a light by your bed that is easy to reach. Do not use any sheets or blankets on your bed that hang to the floor. Have a firm bench or chair with side arms that you can use for support when  you get dressed. What can I do in the kitchen? Clean up any spills right away. If you need to reach something above you, use a sturdy step stool that has a grab bar. Keep electrical cables out of the way. Do not use floor polish or wax that makes floors slippery. What can I do with my stairs? Do not leave anything on the stairs. Make sure that you have a light switch at the top and the bottom of the stairs. Have them installed if you do not have them. Make sure that there are handrails on both sides of the stairs. Fix handrails that are broken or loose. Make sure that handrails are as long as the staircases. Install non-slip stair treads on all stairs in your home if they do not have carpet. Avoid having throw rugs at the top or bottom of stairs, or secure the rugs with carpet tape to prevent them from moving. Choose a carpet design that does not hide the edge of steps on the stairs. Make sure that carpet is firmly attached to the stairs. Fix any carpet that is loose or worn. What can I do on the outside of my home? Use bright outdoor lighting. Repair the edges of walkways and driveways and fix any cracks. Clear paths of anything that can make you trip, such as tools or rocks. Add color or contrast paint or tape to clearly mark and help you see high doorway thresholds. Trim any bushes or trees on the main path into your home. Check that handrails are securely fastened and in good repair. Both sides of all steps should have handrails. Install guardrails along the edges of any raised decks or porches. Have leaves, snow, and ice cleared regularly. Use sand, salt, or ice melt on walkways during winter months if you live where there is ice and snow. In the garage, clean up any spills right away, including grease or oil spills. What other actions can I take? Review your medicines with your health care provider. Some medicines can make you confused or feel dizzy. This can increase your chance of  falling. Wear closed-toe shoes that fit well and support your feet. Wear shoes that have rubber soles and low heels. Use a cane, walker, scooter, or crutches that help you move around if needed. Talk with your provider about other ways that you can decrease your risk of falls. This may include seeing a physical therapist to learn to do exercises to improve movement and strength. Where to find more information Centers for Disease Control and Prevention, STEADI: tonerpromos.no [employer] on Aging: baseringtones.pl National Institute on Aging: baseringtones.pl Contact a health care provider if: You are afraid of falling at home. You feel weak, drowsy, or dizzy at home. You fall at home. Get help right away if you: Lose consciousness or have trouble moving after a fall. Have a fall that causes  a head injury. These symptoms may be an emergency. Get help right away. Call 911. Do not wait to see if the symptoms will go away. Do not drive yourself to the hospital. This information is not intended to replace advice given to you by your health care provider. Make sure you discuss any questions you have with your health care provider. Document Revised: 05/29/2022 Document Reviewed: 05/29/2022 Elsevier Patient Education  2024 [employer].

## 2024-08-19 NOTE — Progress Notes (Signed)
 Chief Complaint  Patient presents with   Medicare Wellness     Subjective:   William Murray is a 73 y.o. male who presents for a Medicare Annual Wellness Visit.  Allergies (verified) Patient has no known allergies.   History: Past Medical History:  Diagnosis Date   Anginal pain    Arthritis    COPD (chronic obstructive pulmonary disease) (HCC)    Erectile disorder due to medical condition in male    Gout    Hyperlipidemia    Hypertension    Renal cyst, right 09/14/2019   Shortness of breath dyspnea    Past Surgical History:  Procedure Laterality Date   arm surgery Left    CARDIAC CATHETERIZATION     TONSILLECTOMY     Family History  Problem Relation Age of Onset   Renal Disease Mother    Diabetes Mother    Dementia Father    COPD Father    Emphysema Father    Cervical cancer Sister    Diabetes Sister    Bladder Cancer Brother    Hypertension Son    Heart disease Son    Heart disease Maternal Grandmother    Heart disease Maternal Grandfather    Heart disease Paternal Grandmother    Heart disease Paternal Grandfather    Aneurysm Sister    Hypertension Son    Social History   Occupational History   Occupation: retired  Tobacco Use   Smoking status: Former    Current packs/day: 0.00    Average packs/day: 0.3 packs/day for 62.0 years (15.5 ttl pk-yrs)    Types: Cigarettes    Start date: 01/08/1956    Quit date: 01/07/2018    Years since quitting: 6.6   Smokeless tobacco: Never  Vaping Use   Vaping status: Never Used  Substance and Sexual Activity   Alcohol use: Yes    Alcohol/week: 21.0 standard drinks of alcohol    Types: 21 Shots of liquor per week    Comment: 3 shot of liquor every day   Drug use: No   Sexual activity: Not Currently   Tobacco Counseling Counseling given: Not Answered  SDOH Screenings   Food Insecurity: No Food Insecurity (08/19/2024)  Housing: Low Risk  (08/19/2024)  Transportation Needs: No Transportation Needs  (08/19/2024)  Utilities: Not At Risk (08/19/2024)  Alcohol Screen: Medium Risk (07/24/2023)  Depression (PHQ2-9): Low Risk  (08/19/2024)  Financial Resource Strain: Low Risk  (07/24/2023)  Physical Activity: Inactive (08/19/2024)  Social Connections: Moderately Isolated (08/19/2024)  Stress: No Stress Concern Present (08/19/2024)  Tobacco Use: Medium Risk (08/19/2024)  Health Literacy: Adequate Health Literacy (08/19/2024)   Depression Screen    08/19/2024    3:19 PM 01/30/2024    9:40 AM 12/27/2023    9:40 AM 07/24/2023    9:08 AM 06/29/2023    1:09 PM 04/27/2023   10:31 AM 02/28/2023   10:26 AM  PHQ 2/9 Scores  PHQ - 2 Score 0 0 0 0 0 0 1  PHQ- 9 Score 0 0  0  0  0  0  2      Data saved with a previous flowsheet row definition      Goals Addressed               This Visit's Progress     Reduce alcohol intake (pt-stated)         Visit info / Clinical Intake: Medicare Wellness Visit Type:: Subsequent Annual Wellness Visit Persons participating in visit::  patient Medicare Wellness Visit Mode:: In-person (required for WTM) Information given by:: patient Interpreter Needed?: No Pre-visit prep was completed: yes AWV questionnaire completed by patient prior to visit?: no Living arrangements:: lives with spouse/significant other Patient's Overall Health Status Rating: good Typical amount of pain: (!) a lot Does pain affect daily life?: (!) yes Are you currently prescribed opioids?: no  Dietary Habits and Nutritional Risks How many meals a day?: 2 Eats fruit and vegetables daily?: (!) no (some, but not daily) Most meals are obtained by: preparing own meals In the last 2 weeks, have you had any of the following?: none Diabetic:: no  Functional Status Activities of Daily Living (to include ambulation/medication): Independent Ambulation: Independent with device- listed below Home Assistive Devices/Equipment: Eyeglasses Medication Administration: Independent Home  Management: Independent Manage your own finances?: yes Primary transportation is: driving Concerns about vision?: no *vision screening is required for WTM* Concerns about hearing?: no  Fall Screening Falls in the past year?: 0 Number of falls in past year: 0 Was there an injury with Fall?: 0 Fall Risk Category Calculator: 0 Patient Fall Risk Level: Low Fall Risk  Fall Risk Patient at Risk for Falls Due to: No Fall Risks Fall risk Follow up: Falls evaluation completed  Home and Transportation Safety: All rugs have non-skid backing?: yes All stairs or steps have railings?: yes Grab bars in the bathtub or shower?: (!) no Have non-skid surface in bathtub or shower?: yes Good home lighting?: yes Regular seat belt use?: yes Hospital stays in the last year:: no  Cognitive Assessment Difficulty concentrating, remembering, or making decisions? : no Will 6CIT or Mini Cog be Completed: yes What year is it?: 0 points What month is it?: 0 points Give patient an address phrase to remember (5 components): [address] [place] About what time is it?: 0 points Count backwards from 20 to 1: 0 points Say the months of the year in reverse: 0 points Repeat the address phrase from earlier: 2 points 6 CIT Score: 2 points  Advance Directives (For Healthcare) Does Patient Have a Medical Advance Directive?: No Would patient like information on creating a medical advance directive?: No - Patient declined  Reviewed/Updated  Reviewed/Updated: Reviewed All (Medical, Surgical, Family, Medications, Allergies, Care Teams, Patient Goals)        Objective:    Today's Vitals   08/19/24 1504  BP: 136/68  Weight: 205 lb 9.6 oz (93.3 kg)  Height: 5' 9 (1.753 m)   Body mass index is 30.36 kg/m.  Current Medications (verified) Outpatient Encounter Medications as of 08/19/2024  Medication Sig   acetaminophen  (TYLENOL ) 500 MG tablet Take 500 mg by mouth every 6 (six) hours as needed.    albuterol  (VENTOLIN  HFA) 108 (90 Base) MCG/ACT inhaler Inhale 2 puffs into the lungs every 6 (six) hours as needed for wheezing or shortness of breath.   allopurinol  (ZYLOPRIM ) 100 MG tablet Take 1 tablet (100 mg total) by mouth daily.   amLODipine  (NORVASC ) 5 MG tablet Take 1 tablet (5 mg total) by mouth daily.   aspirin  81 MG tablet Take 1 tablet (81 mg total) by mouth daily.   atorvastatin  (LIPITOR) 20 MG tablet Take 1 tablet (20 mg total) by mouth every evening.   famotidine  (PEPCID ) 20 MG tablet TAKE 1 TABLET BY MOUTH EVERYDAY AT BEDTIME   levothyroxine  (SYNTHROID ) 50 MCG tablet Take 1 tablet (50 mcg total) by mouth daily.   losartan  (COZAAR ) 100 MG tablet Take 1 tablet (100 mg total)  by mouth daily.   magnesium oxide (MAG-OX) 400 MG tablet Take 800 mg by mouth every evening.   metoprolol  succinate (TOPROL -XL) 25 MG 24 hr tablet Take 1 tablet (25 mg total) by mouth daily.   tamsulosin  (FLOMAX ) 0.4 MG CAPS capsule Take 1 capsule (0.4 mg total) by mouth daily.   umeclidinium-vilanterol (ANORO ELLIPTA ) 62.5-25 MCG/ACT AEPB Inhale 1 puff into the lungs daily.   UNABLE TO FIND Take 2 tablets by mouth daily. Omega OX   lidocaine  (LIDODERM ) 5 % Place 1 patch onto the skin daily. Remove & Discard patch within 12 hours or as directed by MD (Patient not taking: Reported on 08/19/2024)   predniSONE  (DELTASONE ) 10 MG tablet Take 6 tablets by mouth daily for 2 days, then reduce by 1 tablet every 2 days until gone (Patient not taking: Reported on 08/19/2024)   tiZANidine  (ZANAFLEX ) 4 MG tablet Take 1 tablet (4 mg total) by mouth every 8 (eight) hours as needed for muscle spasms. (Patient not taking: Reported on 08/19/2024)   No facility-administered encounter medications on file as of 08/19/2024.   Hearing/Vision screen Hearing Screening - Comments:: Denies hearing loss  Vision Screening - Comments:: UTD @ [facility], [place]  Immunizations and Health Maintenance Health Maintenance  Topic Date  Due   Influenza Vaccine  05/09/2024   COVID-19 Vaccine (6 - 2025-26 season) 06/09/2024   Medicare Annual Wellness (AWV)  08/19/2025   Fecal DNA (Cologuard)  12/16/2026   DTaP/Tdap/Td (4 - Td or Tdap) 03/12/2034   Pneumococcal Vaccine: 50+ Years  Completed   Hepatitis C Screening  Completed   Zoster Vaccines- Shingrix   Completed   Meningococcal B Vaccine  Aged Out        Assessment/Plan:  This is a routine wellness examination for William Murray.  Patient Care Team: Valerio Melanie DASEN, NP as PCP - General (Nurse Practitioner) [facility], [facility]) Perla, Evalene PARAS, MD as Consulting Physician (Cardiology)  I have personally reviewed and noted the following in the patient's chart:   Medical and social history Use of alcohol, tobacco or illicit drugs  Current medications and supplements including opioid prescriptions. Functional ability and status Nutritional status Physical activity Advanced directives List of other physicians Hospitalizations, surgeries, and ER visits in previous 12 months Vitals Screenings to include cognitive, depression, and falls Referrals and appointments  No orders of the defined types were placed in this encounter.  In addition, I have reviewed and discussed with patient certain preventive protocols, quality metrics, and best practice recommendations. A written personalized care plan for preventive services as well as general preventive health recommendations were provided to patient.   Vina Ned, CMA   08/19/2024   Return in 1 year (on 08/19/2025).  After Visit Summary: (In Person-Printed) AVS printed and given to the patient  Nurse Notes:  6 CIT Score - 2 Plans to get flu and Covid vaccine, but declined at today's visit

## 2024-08-20 ENCOUNTER — Encounter: Payer: Self-pay | Admitting: Nurse Practitioner

## 2024-08-20 ENCOUNTER — Ambulatory Visit (INDEPENDENT_AMBULATORY_CARE_PROVIDER_SITE_OTHER): Admitting: Nurse Practitioner

## 2024-08-20 VITALS — BP 132/70 | HR 64 | Temp 98.1°F | Resp 14 | Ht 69.02 in | Wt 214.0 lb

## 2024-08-20 DIAGNOSIS — J432 Centrilobular emphysema: Secondary | ICD-10-CM | POA: Diagnosis not present

## 2024-08-20 DIAGNOSIS — M25552 Pain in left hip: Secondary | ICD-10-CM

## 2024-08-20 DIAGNOSIS — M25551 Pain in right hip: Secondary | ICD-10-CM | POA: Diagnosis not present

## 2024-08-20 MED ORDER — METHYLPREDNISOLONE 4 MG PO TBPK
ORAL_TABLET | ORAL | 0 refills | Status: AC
Start: 2024-08-20 — End: ?

## 2024-08-20 NOTE — Progress Notes (Signed)
 BP 132/70 (BP Location: Left Arm, Patient Position: Sitting, Cuff Size: Normal)   Pulse 64   Temp 98.1 F (36.7 C) (Oral)   Resp 14   Ht 5' 9.02 (1.753 m)   Wt 214 lb (97.1 kg)   SpO2 98%   BMI 31.59 kg/m    Subjective:    Patient ID: William Murray, male    DOB: 11-28-1950, 73 y.o.   MRN: [id number]  HPI: William Murray is a 73 y.o. male  Chief Complaint  Patient presents with   Hip Pain    Both hips. Says its been a couple weeks of this. Fine to walk at home but otherwise can't get out much. Unable to hunt right now. Pain is right across his lower back, hips up back into neck/shoulders and chest. Feels like everything is hurting really.    Needs help with inhaler cost as they stopped covering [place].  HIP PAIN Has been having pain to both hips for 2 weeks. Was trying to squirrel hunt and it would hurt. Trying to take long walk it will hurt. Goes across hips and into lower back, then to neck. Can do short walks without pain. 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 Duration: weeks Involved hip: bilateral  Mechanism of injury: unknown Location: diffuse Onset: sudden  Severity: 10/10  Quality: sharp, aching, and throbbing Frequency: intermittent Radiation: yes as above  Aggravating factors: long walks   Alleviating factors: sitting down and resting + Tylenol   Status: stable Treatments attempted: Tylenol , Rest   Relief with NSAIDs?: No NSAIDs Taken Weakness with weight bearing: no Weakness with walking: no Paresthesias / decreased sensation: no Swelling: no Redness:no Fevers: no   Relevant past medical, surgical, family and social history reviewed and updated as indicated. Interim medical history since our last visit reviewed. Allergies and medications reviewed and updated.  Review of Systems  Constitutional:  Negative for activity change, diaphoresis, fatigue and fever.  Respiratory:  Negative for cough, chest  tightness, shortness of breath and wheezing.   Cardiovascular:  Negative for chest pain, palpitations and leg swelling.  Gastrointestinal: Negative.   Musculoskeletal:  Positive for arthralgias.  Neurological: Negative.   Psychiatric/Behavioral: Negative.      Per HPI unless specifically indicated above     Objective:    BP 132/70 (BP Location: Left Arm, Patient Position: Sitting, Cuff Size: Normal)   Pulse 64   Temp 98.1 F (36.7 C) (Oral)   Resp 14   Ht 5' 9.02 (1.753 m)   Wt 214 lb (97.1 kg)   SpO2 98%   BMI 31.59 kg/m   Wt Readings from Last 3 Encounters:  08/20/24 214 lb (97.1 kg)  08/19/24 205 lb 9.6 oz (93.3 kg)  08/01/24 205 lb (93 kg)    Physical Exam Vitals and nursing note reviewed.  Constitutional:      General: He is awake. He is not in acute distress.    Appearance: He is well-developed and well-groomed. He is obese. He is not ill-appearing or toxic-appearing.  HENT:     Head: Normocephalic.     Right Ear: Hearing and external ear normal.     Left Ear: Hearing and external ear normal.  Eyes:     General: Lids are normal.     Extraocular Movements: Extraocular movements intact.     Conjunctiva/sclera: Conjunctivae normal.  Neck:     Thyroid : No thyromegaly.     Vascular: No carotid  bruit.  Cardiovascular:     Rate and Rhythm: Normal rate and regular rhythm.     Heart sounds: Normal heart sounds. No murmur heard.    No gallop.  Pulmonary:     Effort: No accessory muscle usage or respiratory distress.     Breath sounds: Normal breath sounds.  Abdominal:     General: Bowel sounds are normal. There is no distension.     Palpations: Abdomen is soft.     Tenderness: There is no abdominal tenderness.  Musculoskeletal:     Cervical back: Full passive range of motion without pain.     Lumbar back: Tenderness present. No swelling. Normal range of motion. Negative right straight leg raise test and negative left straight leg raise test.     Right hip:  Tenderness and crepitus present. No deformity or bony tenderness. Normal range of motion. Normal strength.     Left hip: Tenderness and crepitus present. No deformity or bony tenderness. Normal range of motion. Normal strength.     Right lower leg: No edema.     Left lower leg: No edema.     Comments: Lower back both sides with tenderness. No rashes. Discomfort to lower back with flexion/extension/and lateral movement. Both hips with pain during ROM.  Lymphadenopathy:     Cervical: No cervical adenopathy.  Skin:    General: Skin is warm.     Capillary Refill: Capillary refill takes less than 2 seconds.  Neurological:     Mental Status: He is alert and oriented to person, place, and time.     Deep Tendon Reflexes: Reflexes are normal and symmetric.     Reflex Scores:      Brachioradialis reflexes are 2+ on the right side and 2+ on the left side.      Patellar reflexes are 2+ on the right side and 2+ on the left side. Psychiatric:        Attention and Perception: Attention normal.        Mood and Affect: Mood normal.        Speech: Speech normal.        Behavior: Behavior normal. Behavior is cooperative.        Thought Content: Thought content normal.     Results for orders placed or performed in visit on 12/27/23  Microscopic Examination   Collection Time: 12/27/23  9:32 AM   Urine  Result Value Ref Range   RBC, Urine 0-2 0 - 2 /hpf   Epithelial Cells (non renal) CANCELED   Comprehensive metabolic panel   Collection Time: 12/27/23  9:32 AM  Result Value Ref Range   Glucose 125 (H) 70 - 99 mg/dL   BUN 18 8 - 27 mg/dL   Creatinine, Ser 8.98 0.76 - 1.27 mg/dL   eGFR 78 >40 fO/fpw/8.26   BUN/Creatinine Ratio 18 10 - 24   Sodium 142 134 - 144 mmol/L   Potassium 4.1 3.5 - 5.2 mmol/L   Chloride 107 (H) 96 - 106 mmol/L   CO2 19 (L) 20 - 29 mmol/L   Calcium  9.3 8.6 - 10.2 mg/dL   Total Protein 7.6 6.0 - 8.5 g/dL   Albumin 4.6 3.8 - 4.8 g/dL   Globulin, Total 3.0 1.5 - 4.5 g/dL    Bilirubin Total 0.7 0.0 - 1.2 mg/dL   Alkaline Phosphatase 108 44 - 121 IU/L   AST 37 0 - 40 IU/L   ALT 22 0 - 44 IU/L  Lipid Panel w/o Chol/HDL Ratio  Collection Time: 12/27/23  9:32 AM  Result Value Ref Range   Cholesterol, Total 102 100 - 199 mg/dL   Triglycerides 848 (H) 0 - 149 mg/dL   HDL 35 (L) >60 mg/dL   VLDL Cholesterol Cal 26 5 - 40 mg/dL   LDL Chol Calc (NIH) 41 0 - 99 mg/dL  Urinalysis, Routine w reflex microscopic   Collection Time: 12/27/23  9:32 AM  Result Value Ref Range   Specific Gravity, UA >1.030 (H) 1.005 - 1.030   pH, UA 6.0 5.0 - 7.5   Color, UA Yellow Yellow   Appearance Ur Clear Clear   Leukocytes,UA Negative Negative   Protein,UA Negative Negative/Trace   Glucose, UA Negative Negative   Ketones, UA Negative Negative   RBC, UA Trace (A) Negative   Bilirubin, UA Negative Negative   Urobilinogen, Ur 0.2 0.2 - 1.0 mg/dL   Nitrite, UA Negative Negative   Microscopic Examination See below:       Assessment & Plan:   Problem List Items Addressed This Visit       Respiratory   Centrilobular emphysema (HCC) - Primary   Chronic and stable.  Last exacerbation February 2024, improved.  Continue current inhaler regimen as is offering him benefit to breathing, less SOB.  Spirometry last check remains stable with FEV1 93% and FEV1/FVC 115%.  Continue cessation of smoking. Referral to [facility] for assistance with inhaler costs.      Relevant Medications   methylPREDNISolone (MEDROL DOSEPAK) 4 MG TBPK tablet   Other Relevant Orders   AMB Referral [facility] Care Management     Other   Bilateral hip pain   Ongoing for 2 weeks, suspect some related to lower back arthritis. He does not want to pursue PT, although this would be beneficial. Recommend stretching prior to going out on longer walks hunting, showed him stretches to perform and recommend a good 15 minutes of stretching. Take Tylenol  500 to 1000 MG prior to going out on long walks. Will provider steroid taper  today which has offered benefit in past. May take Tizanidine  as needed. If ongoing then recommend he pursue PT.        Follow up plan: Return in about 4 months (around 12/22/2024) for HTN/HLD, COPD.

## 2024-08-20 NOTE — Assessment & Plan Note (Signed)
 Ongoing for 2 weeks, suspect some related to lower back arthritis. He does not want to pursue PT, although this would be beneficial. Recommend stretching prior to going out on longer walks hunting, showed him stretches to perform and recommend a good 15 minutes of stretching. Take Tylenol  500 to 1000 MG prior to going out on long walks. Will provider steroid taper today which has offered benefit in past. May take Tizanidine  as needed. If ongoing then recommend he pursue PT.

## 2024-08-20 NOTE — Assessment & Plan Note (Addendum)
 Chronic and stable.  Last exacerbation February 2024, improved.  Continue current inhaler regimen as is offering him benefit to breathing, less SOB.  Spirometry last check remains stable with FEV1 93% and FEV1/FVC 115%.  Continue cessation of smoking. Referral to [facility] for assistance with inhaler costs.

## 2024-08-20 NOTE — Patient Instructions (Signed)
 Hip Pain The hip is the joint between the upper legs and the lower pelvis. The bones, cartilage, tendons, and muscles of your hip joint support your body and allow you to move around. Hip pain can range from a minor ache to severe pain in one or both of your hips. The pain may be felt on the inside of the hip joint near the groin, or on the outside near the buttocks and upper thigh. You may also have swelling or stiffness in your hip area. Follow these instructions at home: Managing pain, stiffness, and swelling     If told, put ice on the painful area. Put ice in a plastic bag. Place a towel between your skin and the bag. Leave the ice on for 20 minutes, 2-3 times a day. If told, apply heat to the affected area as often as told by your health care provider. Use the heat source that your provider recommends, such as a moist heat pack or a heating pad. Place a towel between your skin and the heat source. Leave the heat on for 20-30 minutes. If your skin turns bright red, remove the ice or heat right away to prevent skin damage. The risk of damage is higher if you cannot feel pain, heat, or cold. Activity Do exercises as told by your provider. Avoid activities that cause pain. General instructions  Take over-the-counter and prescription medicines only as told by your provider. Keep a journal of your symptoms. Write down: How often you have hip pain. The location of your pain. What the pain feels like. What makes the pain worse. Sleep with a pillow between your legs on your most comfortable side. Keep all follow-up visits. Your provider will monitor your pain and activity. Contact a health care provider if: You cannot put weight on your leg. Your pain or swelling gets worse after a week. It gets harder to walk. You have a fever. Get help right away if: You fall. You have a sudden increase in pain and swelling in your hip. Your hip is red or swollen or very tender to touch. This  information is not intended to replace advice given to you by your health care provider. Make sure you discuss any questions you have with your health care provider. Document Revised: 05/30/2022 Document Reviewed: 05/30/2022 Elsevier Patient Education  2024 [employer].

## 2024-08-22 ENCOUNTER — Telehealth: Payer: Self-pay

## 2024-08-22 ENCOUNTER — Other Ambulatory Visit: Payer: Self-pay | Admitting: Nurse Practitioner

## 2024-08-22 NOTE — Progress Notes (Signed)
 Care Guide Pharmacy Note  08/22/2024 Name: William Murray MRN: [id number] DOB: 08/30/1951  Referred By: Valerio Melanie DASEN, NP Reason for referral: Complex Care Management (Outreach to schedule with pharm d )   William Murray is a 73 y.o. year old male who is a primary care patient of Cannady, Jolene T, NP.  William Murray was referred to the pharmacist for assistance related to: emphysema  Successful contact was made with the patient to discuss pharmacy services including being ready for the pharmacist to call at least 5 minutes before the scheduled appointment time and to have medication bottles and any blood pressure readings ready for review. The patient agreed to meet with the pharmacist via telephone visit on (date/time).08/28/2024  William Murray , RMA     [place]  [facility], [facility] Guide  Direct Dial: [phone]  Website: [email or web address]

## 2024-08-24 NOTE — Telephone Encounter (Signed)
 Requested medications are due for refill today.  yes  Requested medications are on the active medications list.  yes  Last refill. 06/2023 #90 4 rf  Future visit scheduled.   yes  Notes to clinic.  Expired labs for these 4 meds    Requested Prescriptions  Pending Prescriptions Disp Refills   losartan  (COZAAR ) 100 MG tablet [Pharmacy Med Name: LOSARTAN  POTASSIUM 100 MG TAB] 90 tablet 4    Sig: TAKE 1 TABLET BY MOUTH EVERY DAY     Cardiovascular:  Angiotensin Receptor Blockers Failed - 08/24/2024  2:43 PM      Failed - Cr in normal range and within 180 days    Creatinine  Date Value Ref Range Status  04/10/2014 1.20 0.60 - 1.30 mg/dL Final   Creatinine, Ser  Date Value Ref Range Status  12/27/2023 1.01 0.76 - 1.27 mg/dL Final         Failed - K in normal range and within 180 days    Potassium  Date Value Ref Range Status  12/27/2023 4.1 3.5 - 5.2 mmol/L Final  04/10/2014 4.2 3.5 - 5.1 mmol/L Final         Passed - Patient is not pregnant      Passed - Last BP in normal range    BP Readings from Last 1 Encounters:  08/20/24 132/70         Passed - Valid encounter within last 6 months    Recent Outpatient Visits           4 days ago Centrilobular emphysema (HCC)   [place] [facility] [place], [place] T, NP   6 months ago Chronic bilateral low back pain with left-sided sciatica   [place] [facility] [place], Melanie T, NP   8 months ago Centrilobular emphysema (HCC)   [place] [facility] [place], [place] T, NP   9 months ago Centrilobular emphysema (HCC)    [facility] [place], [place] T, NP               allopurinol  (ZYLOPRIM ) 100 MG tablet [Pharmacy Med Name: ALLOPURINOL  100 MG TABLET] 90 tablet 4    Sig: TAKE 1 TABLET BY MOUTH EVERY DAY     Endocrinology:  Gout Agents - allopurinol  Failed - 08/24/2024  2:43 PM      Failed - Uric Acid in normal range and within 360 days    Uric Acid   Date Value Ref Range Status  06/29/2023 4.6 3.8 - 8.4 mg/dL Final    Comment:               Therapeutic target for gout patients: <6.0         Failed - CBC within normal limits and completed in the last 12 months    WBC  Date Value Ref Range Status  06/29/2023 8.6 3.4 - 10.8 x10E3/uL Final  04/29/2015 8.3 3.8 - 10.6 K/uL Final   RBC  Date Value Ref Range Status  06/29/2023 4.78 4.14 - 5.80 x10E6/uL Final  04/29/2015 4.58 4.40 - 5.90 MIL/uL Final   Hemoglobin  Date Value Ref Range Status  06/29/2023 16.2 13.0 - 17.7 g/dL Final   Hematocrit  Date Value Ref Range Status  06/29/2023 47.3 37.5 - 51.0 % Final   MCHC  Date Value Ref Range Status  06/29/2023 34.2 31.5 - 35.7 g/dL Final  92/78/7983 65.0 32.0 - 36.0 g/dL Final   [facility]  Date Value Ref Range Status  06/29/2023 33.9 (H) 26.6 - 33.0 pg Final  04/29/2015 32.6 26.0 - 34.0 pg Final   MCV  Date Value Ref Range Status  06/29/2023 99 (H) 79 - 97 fL Final  04/10/2014 92 80 - 100 fL Final   No results found for: PLTCOUNTKUC, LABPLAT, POCPLA RDW  Date Value Ref Range Status  06/29/2023 12.6 11.6 - 15.4 % Final  04/10/2014 13.2 11.5 - 14.5 % Final         Passed - Cr in normal range and within 360 days    Creatinine  Date Value Ref Range Status  04/10/2014 1.20 0.60 - 1.30 mg/dL Final   Creatinine, Ser  Date Value Ref Range Status  12/27/2023 1.01 0.76 - 1.27 mg/dL Final         Passed - Valid encounter within last 12 months    Recent Outpatient Visits           4 days ago Centrilobular emphysema (HCC)   [place] [facility] [place], [place] T, NP   6 months ago Chronic bilateral low back pain with left-sided sciatica   [place] [facility] [place], Melanie T, NP   8 months ago Centrilobular emphysema (HCC)   [place] [facility] [place], [place] T, NP   9 months ago Centrilobular emphysema (HCC)   [place] [facility] [place], [place] T,  NP               tamsulosin  (FLOMAX ) 0.4 MG CAPS capsule [Pharmacy Med Name: TAMSULOSIN  HCL 0.4 MG CAPSULE] 90 capsule 4    Sig: TAKE 1 CAPSULE BY MOUTH EVERY DAY     Urology: Alpha-Adrenergic Blocker Failed - 08/24/2024  2:43 PM      Failed - PSA in normal range and within 360 days    Prostate Specific Ag, Serum  Date Value Ref Range Status  06/27/2022 1.0 0.0 - 4.0 ng/mL Final    Comment:    Roche ECLIA methodology. According to the American Urological Association, Serum PSA should decrease and remain at undetectable levels after radical prostatectomy. The AUA defines biochemical recurrence as an initial PSA value 0.2 ng/mL or greater followed by a subsequent confirmatory PSA value 0.2 ng/mL or greater. Values obtained with different assay methods or kits cannot be used interchangeably. Results cannot be interpreted as absolute evidence of the presence or absence of malignant disease.          Passed - Last BP in normal range    BP Readings from Last 1 Encounters:  08/20/24 132/70         Passed - Valid encounter within last 12 months    Recent Outpatient Visits           4 days ago Centrilobular emphysema (HCC)   [place] [facility] [place], [place] T, NP   6 months ago Chronic bilateral low back pain with left-sided sciatica   [place] [facility] [place], Melanie T, NP   8 months ago Centrilobular emphysema (HCC)   [place] [facility] [place], Melanie T, NP   9 months ago Centrilobular emphysema (HCC)   [place] [facility] [place], Jolene T, NP               levothyroxine  (SYNTHROID ) 50 MCG tablet [Pharmacy Med Name: LEVOTHYROXINE  50 MCG TABLET] 90 tablet 4    Sig: TAKE 1 TABLET BY MOUTH EVERY DAY     Endocrinology:  Hypothyroid Agents Failed -  08/24/2024  2:43 PM      Failed - TSH in normal range and within 360 days    TSH  Date Value Ref Range Status  06/29/2023 3.230 0.450 - 4.500  uIU/mL Final         Passed - Valid encounter within last 12 months    Recent Outpatient Visits           4 days ago Centrilobular emphysema (HCC)   [place] [facility] [place], [place] T, NP   6 months ago Chronic bilateral low back pain with left-sided sciatica   [place] [place], Melanie T, NP   8 months ago Centrilobular emphysema (HCC)   [place] [facility] [place], Melanie T, NP   9 months ago Centrilobular emphysema (HCC)   [place] [facility] [place], [place] T, NP              Signed Prescriptions Disp Refills   amLODipine  (NORVASC ) 5 MG tablet 90 tablet 1    Sig: TAKE 1 TABLET (5 MG TOTAL) BY MOUTH DAILY.     Cardiovascular: Calcium  Channel Blockers 2 Passed - 08/24/2024  2:43 PM      Passed - Last BP in normal range    BP Readings from Last 1 Encounters:  08/20/24 132/70         Passed - Last Heart Rate in normal range    Pulse Readings from Last 1 Encounters:  08/20/24 64         Passed - Valid encounter within last 6 months    Recent Outpatient Visits           4 days ago Centrilobular emphysema (HCC)   [place] [facility] [place], D[place] T, NP   6 months ago Chronic bilateral low back pain with left-sided sciatica   [place] [facility] [place], Melanie T, NP   8 months ago Centrilobular emphysema (HCC)   [place] [facility] [place], [place] T, NP   9 months ago Centrilobular emphysema (HCC)   [place] [facility] [place], Jolene T, NP               metoprolol  succinate (TOPROL -XL) 25 MG 24 hr tablet 90 tablet 1    Sig: TAKE 1 TABLET (25 MG TOTAL) BY MOUTH DAILY.     Cardiovascular:  Beta Blockers Passed - 08/24/2024  2:43 PM      Passed - Last BP in normal range    BP Readings from Last 1 Encounters:  08/20/24 132/70         Passed - Last Heart Rate in normal range    Pulse Readings from Last 1  Encounters:  08/20/24 64         Passed - Valid encounter within last 6 months    Recent Outpatient Visits           4 days ago Centrilobular emphysema (HCC)   [place] [facility] [place], [place] T, NP   6 months ago Chronic bilateral low back pain with left-sided sciatica   [place] [facility] [place], Melanie T, NP   8 months ago Centrilobular emphysema (HCC)   [place] [facility] [place], Melanie T, NP   9 months ago Centrilobular emphysema (HCC)    [facility] [place], [place] T, NP               atorvastatin  (LIPITOR) 20  MG tablet 90 tablet 1    Sig: TAKE 1 TABLET BY MOUTH EVERY DAY IN THE EVENING     Cardiovascular:  Antilipid - Statins Failed - 08/24/2024  2:43 PM      Failed - Lipid Panel in normal range within the last 12 months    Cholesterol, Total  Date Value Ref Range Status  12/27/2023 102 100 - 199 mg/dL Final   Cholesterol Piccolo, Waived  Date Value Ref Range Status  04/17/2017 158 <200 mg/dL Final    Comment:                            Desirable                <200                         Borderline High      200- 239                         High                     >239    LDL Chol Calc (NIH)  Date Value Ref Range Status  12/27/2023 41 0 - 99 mg/dL Final   HDL  Date Value Ref Range Status  12/27/2023 35 (L) >39 mg/dL Final   Triglycerides  Date Value Ref Range Status  12/27/2023 151 (H) 0 - 149 mg/dL Final   Triglycerides Piccolo,Waived  Date Value Ref Range Status  04/17/2017 176 (H) <150 mg/dL Final    Comment:                            Normal                   <150                         Borderline High     150 - 199                         High                200 - 499                         Very High                >499          Passed - Patient is not pregnant      Passed - Valid encounter within last 12 months    Recent Outpatient Visits            4 days ago Centrilobular emphysema (HCC)   [place] [facility] [place], [place] T, NP   6 months ago Chronic bilateral low back pain with left-sided sciatica   [place] [facility] [place], Melanie T, NP   8 months ago Centrilobular emphysema (HCC)   [place] [facility] [place], Melanie T, NP   9 months ago Centrilobular emphysema [facility])   [place] [facility] [place], Melanie DASEN, NP

## 2024-08-24 NOTE — Telephone Encounter (Signed)
 Requested Prescriptions  Pending Prescriptions Disp Refills   losartan  (COZAAR ) 100 MG tablet [Pharmacy Med Name: LOSARTAN  POTASSIUM 100 MG TAB] 90 tablet 4    Sig: TAKE 1 TABLET BY MOUTH EVERY DAY     Cardiovascular:  Angiotensin Receptor Blockers Failed - 08/24/2024  2:42 PM      Failed - Cr in normal range and within 180 days    Creatinine  Date Value Ref Range Status  04/10/2014 1.20 0.60 - 1.30 mg/dL Final   Creatinine, Ser  Date Value Ref Range Status  12/27/2023 1.01 0.76 - 1.27 mg/dL Final         Failed - K in normal range and within 180 days    Potassium  Date Value Ref Range Status  12/27/2023 4.1 3.5 - 5.2 mmol/L Final  04/10/2014 4.2 3.5 - 5.1 mmol/L Final         Passed - Patient is not pregnant      Passed - Last BP in normal range    BP Readings from Last 1 Encounters:  08/20/24 132/70         Passed - Valid encounter within last 6 months    Recent Outpatient Visits           4 days ago Centrilobular emphysema (HCC)   [place] [facility] [place], Buckley T, NP   6 months ago Chronic bilateral low back pain with left-sided sciatica   [facility] [place], Melanie T, NP   8 months ago Centrilobular emphysema (HCC)   [place] [facility] [place], Melanie T, NP   9 months ago Centrilobular emphysema (HCC)   [place] [facility] [place], Jolene T, NP               amLODipine  (NORVASC ) 5 MG tablet [Pharmacy Med Name: AMLODIPINE  BESYLATE 5 MG TAB] 90 tablet 4    Sig: TAKE 1 TABLET (5 MG TOTAL) BY MOUTH DAILY.     Cardiovascular: Calcium  Channel Blockers 2 Passed - 08/24/2024  2:42 PM      Passed - Last BP in normal range    BP Readings from Last 1 Encounters:  08/20/24 132/70         Passed - Last Heart Rate in normal range    Pulse Readings from Last 1 Encounters:  08/20/24 64         Passed - Valid encounter within last 6 months    Recent Outpatient Visits           4  days ago Centrilobular emphysema (HCC)   [facility] [place], [place] T, NP   6 months ago Chronic bilateral low back pain with left-sided sciatica   [place] [facility] [place], Melanie T, NP   8 months ago Centrilobular emphysema (HCC)   [place] [facility] [place], [place] T, NP   9 months ago Centrilobular emphysema (HCC)   [place] [facility] [place], Jolene T, NP               metoprolol  succinate (TOPROL -XL) 25 MG 24 hr tablet [Pharmacy Med Name: METOPROLOL  SUCC ER 25 MG TAB] 90 tablet 4    Sig: TAKE 1 TABLET (25 MG TOTAL) BY MOUTH DAILY.     Cardiovascular:  Beta Blockers Passed - 08/24/2024  2:42 PM      Passed - Last BP in normal range    BP Readings from Last 1 Encounters:  08/20/24 132/70         Passed - Last Heart Rate in normal range    Pulse Readings from Last 1 Encounters:  08/20/24 64         Passed - Valid encounter within last 6 months    Recent Outpatient Visits           4 days ago Centrilobular emphysema (HCC)   [place] [facility] [place], [place] T, NP   6 months ago Chronic bilateral low back pain with left-sided sciatica   [place] [facility] [place], Melanie T, NP   8 months ago Centrilobular emphysema (HCC)   [place] [place], [place] T, NP   9 months ago Centrilobular emphysema (HCC)   [place] [facility] [place], [place] T, NP               allopurinol  (ZYLOPRIM ) 100 MG tablet [Pharmacy Med Name: ALLOPURINOL  100 MG TABLET] 90 tablet 4    Sig: TAKE 1 TABLET BY MOUTH EVERY DAY     Endocrinology:  Gout Agents - allopurinol  Failed - 08/24/2024  2:42 PM      Failed - Uric Acid in normal range and within 360 days    Uric Acid  Date Value Ref Range Status  06/29/2023 4.6 3.8 - 8.4 mg/dL Final    Comment:               Therapeutic target for gout patients: <6.0         Failed - CBC  within normal limits and completed in the last 12 months    WBC  Date Value Ref Range Status  06/29/2023 8.6 3.4 - 10.8 x10E3/uL Final  04/29/2015 8.3 3.8 - 10.6 K/uL Final   RBC  Date Value Ref Range Status  06/29/2023 4.78 4.14 - 5.80 x10E6/uL Final  04/29/2015 4.58 4.40 - 5.90 MIL/uL Final   Hemoglobin  Date Value Ref Range Status  06/29/2023 16.2 13.0 - 17.7 g/dL Final   Hematocrit  Date Value Ref Range Status  06/29/2023 47.3 37.5 - 51.0 % Final   MCHC  Date Value Ref Range Status  06/29/2023 34.2 31.5 - 35.7 g/dL Final  92/78/7983 65.0 32.0 - 36.0 g/dL Final   [facility]  Date Value Ref Range Status  06/29/2023 33.9 (H) 26.6 - 33.0 pg Final  04/29/2015 32.6 26.0 - 34.0 pg Final   MCV  Date Value Ref Range Status  06/29/2023 99 (H) 79 - 97 fL Final  04/10/2014 92 80 - 100 fL Final   No results found for: PLTCOUNTKUC, LABPLAT, POCPLA RDW  Date Value Ref Range Status  06/29/2023 12.6 11.6 - 15.4 % Final  04/10/2014 13.2 11.5 - 14.5 % Final         Passed - Cr in normal range and within 360 days    Creatinine  Date Value Ref Range Status  04/10/2014 1.20 0.60 - 1.30 mg/dL Final   Creatinine, Ser  Date Value Ref Range Status  12/27/2023 1.01 0.76 - 1.27 mg/dL Final         Passed - Valid encounter within last 12 months    Recent Outpatient Visits           4 days ago Centrilobular emphysema (HCC)   [place] [facility] [place], [place] T, NP   6 months ago Chronic bilateral low back pain with left-sided sciatica   [place] [facility] Valerio Melanie DASEN, NP  8 months ago Centrilobular emphysema (HCC)   [place] [facility] [place], [place] T, NP   9 months ago Centrilobular emphysema (HCC)   [place] [facility] [place], [place] T, NP               tamsulosin  (FLOMAX ) 0.4 MG CAPS capsule [Pharmacy Med Name: TAMSULOSIN  HCL 0.4 MG CAPSULE] 90 capsule 4    Sig: TAKE 1 CAPSULE BY MOUTH  EVERY DAY     Urology: Alpha-Adrenergic Blocker Failed - 08/24/2024  2:42 PM      Failed - PSA in normal range and within 360 days    Prostate Specific Ag, Serum  Date Value Ref Range Status  06/27/2022 1.0 0.0 - 4.0 ng/mL Final    Comment:    Roche ECLIA methodology. According to the American Urological Association, Serum PSA should decrease and remain at undetectable levels after radical prostatectomy. The AUA defines biochemical recurrence as an initial PSA value 0.2 ng/mL or greater followed by a subsequent confirmatory PSA value 0.2 ng/mL or greater. Values obtained with different assay methods or kits cannot be used interchangeably. Results cannot be interpreted as absolute evidence of the presence or absence of malignant disease.          Passed - Last BP in normal range    BP Readings from Last 1 Encounters:  08/20/24 132/70         Passed - Valid encounter within last 12 months    Recent Outpatient Visits           4 days ago Centrilobular emphysema (HCC)   [place] [facility] [place], [place] T, NP   6 months ago Chronic bilateral low back pain with left-sided sciatica   [place] [facility] [place], Melanie T, NP   8 months ago Centrilobular emphysema (HCC)   [place] [facility] [place], Melanie T, NP   9 months ago Centrilobular emphysema (HCC)   [place] [facility] [place], [place] T, NP               levothyroxine  (SYNTHROID ) 50 MCG tablet [Pharmacy Med Name: LEVOTHYROXINE  50 MCG TABLET] 90 tablet 4    Sig: TAKE 1 TABLET BY MOUTH EVERY DAY     Endocrinology:  Hypothyroid Agents Failed - 08/24/2024  2:42 PM      Failed - TSH in normal range and within 360 days    TSH  Date Value Ref Range Status  06/29/2023 3.230 0.450 - 4.500 uIU/mL Final         Passed - Valid encounter within last 12 months    Recent Outpatient Visits           4 days ago Centrilobular emphysema (HCC)   [facility] [place], [place] T, NP   6 months ago Chronic bilateral low back pain with left-sided sciatica   [place] [facility], Melanie T, NP   8 months ago Centrilobular emphysema (HCC)   [place] [facility] [place], [place] T, NP   9 months ago Centrilobular emphysema (HCC)    [facility] [place], Jolene T, NP               atorvastatin  (LIPITOR) 20 MG tablet [Pharmacy Med Name: ATORVASTATIN  20 MG TABLET] 90 tablet 4    Sig: TAKE 1 TABLET BY MOUTH EVERY DAY IN THE EVENING     Cardiovascular:  Antilipid - Statins Failed -  08/24/2024  2:42 PM      Failed - Lipid Panel in normal range within the last 12 months    Cholesterol, Total  Date Value Ref Range Status  12/27/2023 102 100 - 199 mg/dL Final   Cholesterol Piccolo, [place]  Date Value Ref Range Status  04/17/2017 158 <200 mg/dL Final    Comment:                            Desirable                <200                         Borderline High      200- 239                         High                     >239    LDL Chol Calc (NIH)  Date Value Ref Range Status  12/27/2023 41 0 - 99 mg/dL Final   HDL  Date Value Ref Range Status  12/27/2023 35 (L) >39 mg/dL Final   Triglycerides  Date Value Ref Range Status  12/27/2023 151 (H) 0 - 149 mg/dL Final   Triglycerides Piccolo,Waived  Date Value Ref Range Status  04/17/2017 176 (H) <150 mg/dL Final    Comment:                            Normal                   <150                         Borderline High     150 - 199                         High                200 - 499                         Very High                >499          Passed - Patient is not pregnant      Passed - Valid encounter within last 12 months    Recent Outpatient Visits           4 days ago Centrilobular emphysema (HCC)   [place] [facility] [place], [place] T, NP   6 months ago  Chronic bilateral low back pain with left-sided sciatica   [place] [facility] [place], Melanie T, NP   8 months ago Centrilobular emphysema (HCC)   [place] [facility] [place], Melanie T, NP   9 months ago Centrilobular emphysema [facility] [place])   [place] [place], Melanie DASEN, NP

## 2024-08-28 ENCOUNTER — Telehealth: Payer: Self-pay

## 2024-08-28 ENCOUNTER — Other Ambulatory Visit: Payer: Self-pay

## 2024-08-28 DIAGNOSIS — J432 Centrilobular emphysema: Secondary | ICD-10-CM

## 2024-08-28 MED ORDER — UMECLIDINIUM-VILANTEROL 62.5-25 MCG/ACT IN AEPB: 1.0000 | INHALATION_SPRAY | Freq: Every day | RESPIRATORY_TRACT | 4 refills | Status: AC

## 2024-08-28 NOTE — Progress Notes (Signed)
   08/28/2024  Patient ID: William Murray, male   DOB: [birth year], 73 y.o.   MRN: [id number]  [facility] and I contact the pharmacy to verify it is going through on insurance at $0 copay.  Medication has to be ordered, but it will be ready for pick up tomorrow.  Attempted to call William Murray to notify, but I had to leave a HIPAA compliant voicemail.  Message left along with my direct number.  It is possible information he received may go into affect after the beginning of the year and will affect Anoro copay.  If that is the case, hopefully he will be approved for LIS.  Channing DELENA Mealing, PharmD, DPLA

## 2024-08-28 NOTE — Progress Notes (Signed)
 08/28/2024 Name: William Murray MRN: [id number] DOB: [birth year]  Chief Complaint  Patient presents with   Medication Assistance   William Murray is a 73 y.o. year old male who presented for a telephone visit.   They were referred to the pharmacist by their PCP for assistance in managing medication access.    Subjective:  Care Team: Primary Care Provider: Valerio Melanie DASEN, NP ; Next Scheduled Visit: 12/19/2023  Medication Access/Adherence  [facility] [address] [place] - [address] [place] [address] Phone: [phone] Fax: [phone]  Patient reports affordability concerns with their medications: Yes  Patient reports access/transportation concerns to their pharmacy: No  Patient reports adherence concerns with their medications:  Yes    COPD: Current medications:  albuterol  HFA as needed -Patient is prescribed Anoro 62.5/25 1 puff daily, which has been beneficial; but he has been out of this medication for at least 1 month due to cost.  States insurance was covering this, with [facility] dual complete.  Patient believes Medicaid benefit was lost, and he now just has medicare and Anoro copay was going to be >$200.  Objective:  Lab Results  Component Value Date   CREATININE 1.01 12/27/2023   BUN 18 12/27/2023   NA 142 12/27/2023   K 4.1 12/27/2023   CL 107 (H) 12/27/2023   CO2 19 (L) 12/27/2023   Medications Reviewed Today     Reviewed by Deanna Channing LABOR, [facility] (Pharmacist) on 08/28/24 at [phone]  Med List Status: <None>   Medication Order Taking? Sig Documenting Provider Last Dose Status Informant  acetaminophen  (TYLENOL ) 500 MG tablet [id number]  Take 500 mg by mouth every 6 (six) hours as needed. [provider]  Active   albuterol  (VENTOLIN  HFA) 108 (90 Base) MCG/ACT inhaler [id number] Yes Inhale 2 puffs into the lungs every 6 (six) hours as needed for wheezing or shortness of breath. Cannady, Jolene T, NP  Active    allopurinol  (ZYLOPRIM ) 100 MG tablet [id number] Yes TAKE 1 TABLET BY MOUTH EVERY DAY Cannady, Jolene T, NP  Active   amLODipine  (NORVASC ) 5 MG tablet [id number] Yes TAKE 1 TABLET (5 MG TOTAL) BY MOUTH DAILY. Cannady, Jolene T, NP  Active   aspirin  81 MG tablet [id number] Yes Take 1 tablet (81 mg total) by mouth daily. Daphane Channing, NP  Active   atorvastatin  (LIPITOR) 20 MG tablet [id number] Yes TAKE 1 TABLET BY MOUTH EVERY DAY IN THE EVENING Cannady, Jolene T, NP  Active   famotidine  (PEPCID ) 20 MG tablet [id number] Yes TAKE 1 TABLET BY MOUTH EVERYDAY AT BEDTIME Cannady, Jolene T, NP  Active   levothyroxine  (SYNTHROID ) 50 MCG tablet [id number] Yes TAKE 1 TABLET BY MOUTH EVERY DAY Cannady, Jolene T, NP  Active   losartan  (COZAAR ) 100 MG tablet [id number] Yes TAKE 1 TABLET BY MOUTH EVERY DAY Cannady, Jolene T, NP  Active   magnesium oxide (MAG-OX) 400 MG tablet [id number]  Take 800 mg by mouth every evening. [provider]  Active   methylPREDNISolone (MEDROL DOSEPAK) 4 MG TBPK tablet [id number]  Take as instructed on packaging. Cannady, Jolene T, NP  Active   metoprolol  succinate (TOPROL -XL) 25 MG 24 hr tablet [id number] Yes TAKE 1 TABLET (25 MG TOTAL) BY MOUTH DAILY. Cannady, Jolene T, NP  Active   tamsulosin  (FLOMAX ) 0.4 MG CAPS capsule [id number] Yes TAKE 1 CAPSULE BY MOUTH EVERY DAY Cannady, Jolene T, NP  Active   umeclidinium-vilanterol (ANORO ELLIPTA ) 62.5-25  MCG/ACT AEPB [id number]  Inhale 1 puff into the lungs daily.  Patient not taking: Reported on 08/28/2024   Valerio Moris T, NP  Active   UNABLE TO FIND [id number]  Take 2 tablets by mouth daily. [employer] [provider]  Active            Assessment/Plan:   COPD: -Currently controlled.  -Patient does not qualify for [employer] PAP for Anoro based on $600 OOP requirement, but he likely qualifies for LIS Medicare Extra Help.   Application submitted today, but it will take several weeks for application to process.  If approved,  Anoro copay would be around $12. -[facility].  No refills remain on prescription on file, but the employee did an eligibility check and it came back reflecting $0 copay.  Order pending for [facility] [place].  If signed, I will call pharmacy to verify coverage/copay. -If Anoro is not covered or copay not affordable, I will see if [facility] has a sample option we could provide until we hear back from LIS application.  If no sample available, I can see if generic Advair or Symbicort would be covered at an affordable copay; so has a maintenance inhaler verus just albuterol  HFA at this time  Channing DELENA Mealing, PharmD, DPLA

## 2024-08-29 NOTE — Progress Notes (Signed)
   08/29/2024  Patient ID: William Murray Greet, male   DOB: 1951/01/04, 73 y.o.   MRN: [id number]  Contacted [facility] to inform [facility], and this is going through on insurance for $0.  Medication had to be ordered but should be ready for pick up later today.  Advised patient to resume daily use and refill again before EOY while insurance covers in full.  I will follow-up with him the end of December to see if they have received communication in regard to LIS application submitted.  Channing DELENA Mealing, PharmD, DPLA

## 2024-09-19 ENCOUNTER — Other Ambulatory Visit: Payer: Self-pay | Admitting: Acute Care

## 2024-09-19 DIAGNOSIS — Z122 Encounter for screening for malignant neoplasm of respiratory organs: Secondary | ICD-10-CM

## 2024-09-19 DIAGNOSIS — Z87891 Personal history of nicotine dependence: Secondary | ICD-10-CM

## 2024-10-06 ENCOUNTER — Other Ambulatory Visit (INDEPENDENT_AMBULATORY_CARE_PROVIDER_SITE_OTHER): Payer: Self-pay

## 2024-10-06 DIAGNOSIS — J432 Centrilobular emphysema: Secondary | ICD-10-CM

## 2024-10-06 NOTE — Progress Notes (Signed)
" ° °  10/06/2024 Name: William Murray MRN: [id number] DOB: [birth year]  No chief complaint on file.  William Murray is a 73 y.o. year old male who presented for a telephone visit.   They were referred to the pharmacist by their PCP for assistance in managing medication access.   Subjective:  Care Team: Primary Care Provider: Valerio Melanie DASEN, NP ; Next Scheduled Visit: 12/19/2023  Medication Access/Adherence  [facility] [address] [place] [facility] [address] [place] [address] Phone: [phone] Fax: [phone]  Patient reports affordability concerns with their medications: Yes  Patient reports access/transportation concerns to their pharmacy: No  Patient reports adherence concerns with their medications:  Yes    COPD: Current medications:  albuterol  HFA as needed, Anoro 62.5/25mcg 1 puff daily -Patient was able to pick up and resume Anoro in November, and he was recently able to refill again last week for a $0 copay -Verified patient is using 1 puff every day for maintenance -We applied to LIS Medicare Extra Help in case additional copay support may be needed in the future, but the patient has not received communication from [facility] yet in response to application  Objective:  Lab Results  Component Value Date   CREATININE 1.01 12/27/2023   BUN 18 12/27/2023   NA 142 12/27/2023   K 4.1 12/27/2023   CL 107 (H) 12/27/2023   CO2 19 (L) 12/27/2023   Assessment/Plan:   COPD: -Currently controlled.  -Continue current regimen at this time -Patient currently has about a 3 week supply of Anoro remaining -I will check on coverage/copay for Anoro after the 1st of the year to see if this will continue to be covered by insurance at an affordable copay.  If approved for Medicare Extra Help, copay will be approximately $12 for a 3 month supply.  Follow-up:  Will check coverage/cost of Anoro and follow-up with patient on 1/8  Channing DELENA Mealing,  PharmD, DPLA    "

## 2024-10-13 ENCOUNTER — Other Ambulatory Visit: Payer: Self-pay | Admitting: Nurse Practitioner

## 2024-10-14 NOTE — Telephone Encounter (Signed)
 Requested Prescriptions  Pending Prescriptions Disp Refills   famotidine  (PEPCID ) 20 MG tablet [Pharmacy Med Name: FAMOTIDINE  20 MG TABLET] 90 tablet 1    Sig: TAKE 1 TABLET BY MOUTH EVERYDAY AT BEDTIME     Gastroenterology:  H2 Antagonists Passed - 10/14/2024  3:10 PM      Passed - Valid encounter within last 12 months    Recent Outpatient Visits           1 month ago Centrilobular emphysema (HCC)   [place] [facility] [place], [place] T, NP   8 months ago Chronic bilateral low back pain with left-sided sciatica   [place] [facility] [place], Melanie T, NP   9 months ago Centrilobular emphysema (HCC)   [place] [facility] [place], Melanie T, NP   11 months ago Centrilobular emphysema [facility])   [place] [facility] [place], Melanie DASEN, NP

## 2024-10-16 NOTE — Progress Notes (Unsigned)
 Called patient's pharmacy to check on co-pay for Anoro Ellipta  which was priced at $300 for generic and $450 for the brand after ran through insurance. Clinical Pharmacist will follow up with patient regarding PAP options.   [place] Student - PharmD

## 2024-10-20 NOTE — Progress Notes (Unsigned)
" ° °  10/21/24 Name: William Murray MRN: [id number] DOB: 1950/11/02  William Murray is a 74 y.o. year old male who presented for a telephone visit.   They were referred to the pharmacist by their PCP for assistance in managing medication access.   Subjective:  Care Team: Primary Care Provider: Valerio Melanie DASEN, NP ; Next Scheduled Visit: 12/23/24  Medication Access/Adherence  [facility] [address] [place] [facility] [address] [place] [address] Phone: [phone] Fax: [phone]  Patient reports affordability concerns with their medications: Yes  Patient reports access/transportation concerns to their pharmacy: No  Patient reports adherence concerns with their medications:  Yes    COPD: Current medications:  albuterol  HFA as needed, Anoro 62.5/25mcg 1 puff daily -Patient was able to pick up and resume Anoro at the end of 2026; however, copay now is reflecting $300 for generic and $450 for brand.  This is likely due to a deductible, but the medication is not affordable for patient to refill.  He currently has a 9 day supply remaining.  Objective:  Lab Results  Component Value Date   CREATININE 1.01 12/27/2023   BUN 18 12/27/2023   NA 142 12/27/2023   K 4.1 12/27/2023   CL 107 (H) 12/27/2023   CO2 19 (L) 12/27/2023   Assessment/Plan:   COPD: -Currently controlled.  -Continue current regimen at this time -He has not received a response from [employer] in regard to LIS Medicare Extra Help application we submitted  -Does not qualify for Anoro PAP based on programs requirement that $600 be paid out of pocket on medications -He would likely qualify for AZ&Me PAP for either Breztri or Bevespi.  I recommend consideration of changing to Bevespi, as this medication contains an anticholinergic and beta 2 agonist like Anoro does.  Coordinating with PCP to see if in agreement and if the office has an inhaler sample the patient can get in the  meantime.  Follow-up:  Will follow-up with patient once response received from PCP  William Murray, PharmD, DPLA    "

## 2024-10-21 ENCOUNTER — Other Ambulatory Visit

## 2024-10-21 DIAGNOSIS — J432 Centrilobular emphysema: Secondary | ICD-10-CM

## 2024-10-22 ENCOUNTER — Telehealth: Payer: Self-pay

## 2024-10-22 NOTE — Telephone Encounter (Signed)
 Filled and faxed provider portion AZ&ME [place]) to provider office for provider to sign and date,mail out pt portion .

## 2024-10-23 ENCOUNTER — Telehealth: Payer: Self-pay | Admitting: Pharmacy Technician

## 2024-10-23 NOTE — Telephone Encounter (Signed)
-----   Message from William Murray sent at 10/22/2024  9:18 AM EST ----- Good morning!  Would you all be able to run a test claim to see if [name]'s insurance would cover either Wixela 250/50, Symbicort (generic) 160/4.5, or tiotropium when you are able, please?  Thank you so much!  William Murray, PharmD, DPLA

## 2024-10-24 ENCOUNTER — Other Ambulatory Visit (HOSPITAL_COMMUNITY): Payer: Self-pay

## 2024-10-24 ENCOUNTER — Telehealth: Payer: Self-pay

## 2024-10-24 NOTE — Progress Notes (Signed)
" ° °  10/24/2024  Patient ID: [name], male   DOB: 12/06/1950, 74 y.o.   MRN: [id number]  PAP application for [place]arius has been mailed to patient's home and faxed to PCP to complete.  The PA team was able to process test claims for inhalers, and the cheapest option currently would be Wixela for $89/month; generics for Anoro, Spiriva, Symbicort were $200-$300+.  These options (including Wixela) will not be affordable for the patient.  He only has a few days of Anoro remaining, and [facility] does not have any comparable inhaler samples on hand at this time.  Advised patient to use albuterol  PRN and be on the lookout for the AZ&Me PAP application in the mail, so we can get that submitted ASAP.  I will check in with him in 10 days to make sure this was received and completed.  Channing DELENA Mealing, PharmD, DPLA  "

## 2024-11-06 NOTE — Telephone Encounter (Signed)
 Received pt portion AZ&ME (Bevespi)application waiting on provider portion,faxed provider portion today.

## 2024-11-10 NOTE — Telephone Encounter (Signed)
 Received provider portion AZ&ME Bevespi,back from provider office ,faxed to company today.

## 2024-11-11 NOTE — Telephone Encounter (Signed)
 Received approval letter from AZ&ME [place]) thru 10/08/2025.approval letter index.

## 2024-11-13 ENCOUNTER — Other Ambulatory Visit (HOSPITAL_COMMUNITY): Payer: Self-pay

## 2024-12-18 ENCOUNTER — Ambulatory Visit: Admitting: Nurse Practitioner

## 2024-12-23 ENCOUNTER — Ambulatory Visit: Admitting: Nurse Practitioner

## 2025-08-20 ENCOUNTER — Ambulatory Visit
